# Patient Record
Sex: Female | Born: 1960 | Race: Black or African American | Hispanic: No | Marital: Single | Smoking: Never smoker
Health system: Southern US, Community
[De-identification: ages and names within clinical notes are randomized; demographics above are authoritative.]

## PROBLEM LIST (undated history)

## (undated) DIAGNOSIS — I10 Essential (primary) hypertension: Secondary | ICD-10-CM

## (undated) DIAGNOSIS — M1990 Unspecified osteoarthritis, unspecified site: Secondary | ICD-10-CM

## (undated) DIAGNOSIS — J189 Pneumonia, unspecified organism: Secondary | ICD-10-CM

## (undated) DIAGNOSIS — Z9889 Other specified postprocedural states: Secondary | ICD-10-CM

## (undated) DIAGNOSIS — K219 Gastro-esophageal reflux disease without esophagitis: Secondary | ICD-10-CM

## (undated) DIAGNOSIS — R011 Cardiac murmur, unspecified: Secondary | ICD-10-CM

## (undated) DIAGNOSIS — E119 Type 2 diabetes mellitus without complications: Secondary | ICD-10-CM

## (undated) DIAGNOSIS — T8859XA Other complications of anesthesia, initial encounter: Secondary | ICD-10-CM

## (undated) DIAGNOSIS — I739 Peripheral vascular disease, unspecified: Secondary | ICD-10-CM

## (undated) DIAGNOSIS — R112 Nausea with vomiting, unspecified: Secondary | ICD-10-CM

## (undated) DIAGNOSIS — I6529 Occlusion and stenosis of unspecified carotid artery: Secondary | ICD-10-CM

## (undated) DIAGNOSIS — N189 Chronic kidney disease, unspecified: Secondary | ICD-10-CM

## (undated) DIAGNOSIS — E785 Hyperlipidemia, unspecified: Secondary | ICD-10-CM

## (undated) DIAGNOSIS — G629 Polyneuropathy, unspecified: Secondary | ICD-10-CM

## (undated) HISTORY — DX: Gastro-esophageal reflux disease without esophagitis: K21.9

## (undated) HISTORY — DX: Essential (primary) hypertension: I10

## (undated) HISTORY — DX: Type 2 diabetes mellitus without complications: E11.9

## (undated) HISTORY — DX: Hyperlipidemia, unspecified: E78.5

## (undated) HISTORY — DX: Occlusion and stenosis of unspecified carotid artery: I65.29

---

## 1990-12-01 HISTORY — PX: TUBAL LIGATION: SHX77

## 2005-12-01 HISTORY — PX: DILATION AND CURETTAGE OF UTERUS: SHX78

## 2006-01-26 ENCOUNTER — Other Ambulatory Visit: Admission: RE | Admit: 2006-01-26 | Discharge: 2006-01-26 | Payer: Self-pay | Admitting: Obstetrics and Gynecology

## 2006-01-28 ENCOUNTER — Ambulatory Visit (HOSPITAL_COMMUNITY): Admission: RE | Admit: 2006-01-28 | Discharge: 2006-01-28 | Payer: Self-pay | Admitting: Obstetrics and Gynecology

## 2006-02-12 ENCOUNTER — Encounter: Admission: RE | Admit: 2006-02-12 | Discharge: 2006-02-12 | Payer: Self-pay | Admitting: Obstetrics and Gynecology

## 2006-06-01 ENCOUNTER — Encounter (INDEPENDENT_AMBULATORY_CARE_PROVIDER_SITE_OTHER): Payer: Self-pay | Admitting: Specialist

## 2006-06-01 ENCOUNTER — Inpatient Hospital Stay (HOSPITAL_COMMUNITY): Admission: RE | Admit: 2006-06-01 | Discharge: 2006-06-02 | Payer: Self-pay | Admitting: Obstetrics and Gynecology

## 2007-01-09 ENCOUNTER — Emergency Department (HOSPITAL_COMMUNITY): Admission: EM | Admit: 2007-01-09 | Discharge: 2007-01-09 | Payer: Self-pay | Admitting: Emergency Medicine

## 2007-07-19 ENCOUNTER — Emergency Department (HOSPITAL_COMMUNITY): Admission: EM | Admit: 2007-07-19 | Discharge: 2007-07-19 | Payer: Self-pay | Admitting: Emergency Medicine

## 2010-09-26 ENCOUNTER — Emergency Department (HOSPITAL_COMMUNITY): Admission: EM | Admit: 2010-09-26 | Discharge: 2010-09-26 | Payer: Self-pay | Admitting: Emergency Medicine

## 2011-02-12 LAB — BASIC METABOLIC PANEL
BUN: 10 mg/dL (ref 6–23)
CO2: 22 mEq/L (ref 19–32)
Calcium: 8.2 mg/dL — ABNORMAL LOW (ref 8.4–10.5)
Creatinine, Ser: 0.9 mg/dL (ref 0.4–1.2)
Glucose, Bld: 159 mg/dL — ABNORMAL HIGH (ref 70–99)

## 2011-02-12 LAB — CBC
MCH: 29.3 pg (ref 26.0–34.0)
MCHC: 33.6 g/dL (ref 30.0–36.0)
MCV: 87.2 fL (ref 78.0–100.0)
Platelets: 256 10*3/uL (ref 150–400)

## 2011-02-12 LAB — DIFFERENTIAL
Basophils Relative: 1 % (ref 0–1)
Eosinophils Absolute: 0.2 10*3/uL (ref 0.0–0.7)
Eosinophils Relative: 3 % (ref 0–5)
Neutrophils Relative %: 59 % (ref 43–77)

## 2011-02-12 LAB — D-DIMER, QUANTITATIVE: D-Dimer, Quant: 0.4 ug/mL-FEU (ref 0.00–0.48)

## 2011-04-18 NOTE — Op Note (Signed)
Meagan [name], Meagan [name]           ACCOUNT NO.:  [id number]   MEDICAL RECORD NO.:  [id number]          PATIENT TYPE:  AMB   LOCATION:  SDC                           FACILITY:  [facility]   PHYSICIAN:  Osborn Coho, M.D.   DATE OF BIRTH:  July 16, 1961   DATE OF PROCEDURE:  06/01/2006  DATE OF DISCHARGE:                                 OPERATIVE REPORT   PREOPERATIVE DIAGNOSES:  1.  Menorrhagia.  2.  Uterine fibroids.  3.  Mixed urinary incontinence with hypermobile urethra.   POSTOPERATIVE DIAGNOSES:  1.  Menorrhagia.  2.  Uterine fibroids.  3.  Mixed urinary incontinence with hypermobile urethra.   PROCEDURE:  Hysteroscopy D&C and endometrial ablation via Novasure and TVT  and cystoscopy.   ATTENDING:  Osborn Coho, M.D.   ASSISTANT:  Marquis Lunch. Adline Peals.   ANESTHESIA:  General.   SPECIMENS TO PATHOLOGY:  Endometrial curettings, frozen section  proliferative phase endometrium and portions sent for permanent.   FLUID:  2300 mL.   URINE OUTPUT:  100 mL.   LACTATED RINGER'S HYSTEROSCOPIC DEFICIT:  60 mL.   COMPLICATIONS:  None.   FINDINGS:  The uterus sounded to 10 cm, cervix was measured 4 cm, cavity  length 6 cm, cavity width 2.7 cm, ablation for approximately one minute and  38 seconds.   PROCEDURE:  The patient is taken to the operating room after risks,  benefits, alternatives reviewed with the patient.  The patient verbalized  understanding, consent signed and witnessed.  The patient was placed under  general per anesthesia and prepped and draped in normal sterile fashion.  A  bivalve speculum placed in the patient's vagina and the anterior lip of the  cervix grasped with single-tooth tenaculum.  The cervix was measured and  uterus sounded and cervix was dilated for passage of the diagnostic  hysteroscope.  Diagnostic hysteroscope introduced and no intracavitary  lesions noted with fluffy endometrium.  Curettage performed and endometrial  curettings sent to  pathology for frozen section and a portion for permanent.  While awaiting frozen section, instruments were then removed and weighted  speculum placed.  Vaginal wall retractor used as well.  Dilute Pitressin and  injected in the anterior vaginal mucosa at the level of the mid urethra.  Vaginal mucosa was incised and dissected away from the underlying tissue.  This was done to the level of the lower edge of symphysis pubis.  Attention  was then turned to the mons pubis where two incisions approximately 5 mm  were made two fingerbreadths from the midline bilaterally at the upper edge  of the symphysis pubis.  The bladder was drained and rigid urethral catheter  guide placed and an 18-French Foley catheter in the bladder.  The guide was  deflected to the right and transabdominal guide passed on the ipsilateral  side through the space of Retzius out through the vaginal wall.  The same  was done on the contralateral side.  Cystoscopy was performed and no  inadvertent bladder injury noted.  The mesh was then attached to the  transabdominal guides and after emptying the bladder and deflecting guide  to  the appropriate side the mesh was elevated through the space of Retzius.  The mesh was left flat with a large Kelly beneath the mid urethra and an  anterior vaginal mucosa repaired with 2-0 Vicryl in a running locked  fashion.  The mesh at the mons pubis was then cut flush with the skin and  the incision repaired with Dermabond.  The weighted speculum was removed and  bivalve speculum placed once again and tenaculum placed on the anterior lip.  The cervix was dilated for passage of the Novasure instrument.  The Novasure  was placed and ablation performed without difficulty.  All instruments were  removed and vagina was packed with 2-inch plain packing soaked with  estrogen.  Sponge, lap and needle count was correct.  The patient tolerated  procedure well and was transferred to the recovery room in  good condition.      Osborn Coho, M.D.  Electronically Signed     AR/MEDQ  D:  06/01/2006  T:  06/01/2006  Job:  [id number]

## 2011-04-18 NOTE — H&P (Signed)
NAME:  PRENTISS, HAMMETT           ACCOUNT NO.:  [id number]   MEDICAL RECORD NO.:  [id number]          PATIENT TYPE:  AMB   LOCATION:  SDC                           [facility]ILITY:  [facility]   PHYSICIAN:  Osborn Coho, M.D.   DATE OF BIRTH:  11-Jul-1961   DATE OF ADMISSION:  DATE OF DISCHARGE:                                HISTORY & PHYSICAL   HISTORY OF PRESENT ILLNESS:  Ms. Desa is a 50 year old divorced African-  American female para 2-0-0-2 who presents for hysteroscopy with D&C and  endometrial ablation, along with placement of tension-free vaginal tape  because of menorrhagia and incontinence.  The patient describes a 4-day  period accompanied by heavy clotting during which time she uses three long  pads per day.  The patient also relays that when she goes to the bathroom to  void the toilet (during these times) is filled with blood.  More recently  the patient has also had some intermenstrual bleeding which she describes as  spotting for approximately 1 day but will occur randomly between one period  and the next.  Additionally, the patient has noticed some urine leakage  whenever she coughs or sneezes, and even on occasion she will have  spontaneous leakage.  Cystometrics done on May 14, 2006, revealed that the  patient had mixed urinary incontinence.  A pelvic ultrasound in March 2007  revealed an anterior fibroid measuring 2.3 x 2.3 x 2.1 cm and a TSH also  done at that time was within normal limits.  After review of both medical  and surgical options for symptom management, the patient has decided to  proceed with endometrial ablation and the placement of tension-free vaginal  tape.   PAST MEDICAL HISTORY:  1.  Obstetric history:  Gravida 2, para 2-0-0-2.  The patient delivered      spontaneously through vagina on each occasion.  She experienced      gestational diabetes and anemia.  2.  GYN history:  Menarche 50 years old.  Last menstrual period May 21, 2006.  The  patient uses bilateral tubal ligation as her method of      contraception.  Denies any history of abnormal Pap smears.  She does      have a remote history of syphilis and chlamydia.  Her last normal Pap      smear was February 2007.  3.  Medical history:  Diabetes mellitus, gastroesophageal reflux disease,      and anemia.  4.  Surgical history:  In 1992, bilateral tubal ligation.  In 1994, right      knee surgery.  The patient denies any reaction to anesthesia or history      of blood transfusions.   FAMILY HISTORY:  Cardiovascular disease, cancer, hypertension, and diabetes  mellitus.   HABITS:  The patient occasionally consumes alcohol.  Does not use any  tobacco.   CURRENT MEDICATIONS:  1.  Nexium 40 mg daily.  2.  Crestor 20 mg daily.  3.  Byetta 10 mcg subcu in a.m. and prior to evening meal.  4.  Lisinopril/hydrochlorothiazide 10/12.5 one  tablet daily.  5.  Metformin 750 mg two tablets at bedtime.  6.  Actos 30 mg one tablet daily.  7.  Detrol LA 40 mg one tablet daily.   ALLERGIES:  The patient is allergic to PHENAZOPYRIDINE which causes  shortness of breath and symptoms of Bells palsy.   REVIEW OF SYSTEMS:  Except as mentioned in history of present illness are  negative to include no chest pain, shortness of breath, nausea, vomiting,  diarrhea, fever, or pelvic pain.   PHYSICAL EXAMINATION:  VITAL SIGNS:  Blood pressure 120/80, weight 231  pounds, height 5 feet 3 inches tall.  NECK:  Supple without masses.  There is no thyromegaly or cervical  adenopathy.  HEART:  Regular rate and rhythm.  There is no murmur.  LUNGS:  Clear to auscultation.  BACK:  No CVA tenderness.  ABDOMEN:  Bowel sounds are present.  It is soft without tenderness,  guarding, rebound, or organomegaly.  EXTREMITIES:  Without clubbing, cyanosis, or edema.  PELVIC:  EG/BUS is within normal limits.  The vagina is rugous.  The cervix  is nontender without lesions.  Uterus appears normal size, shape  and  consistency without tenderness though exam is limited by body habitus.  Adnexa without tenderness or masses.   IMPRESSION:  1.  Menorrhagia.  2.  Mixed urinary incontinence.  3.  Uterine fibroids.   DISPOSITION:  A discussion was held with the patient regarding the  indications for her procedures, along with their risks, which include but  are not limited to reaction to anesthesia, damage to adjacent organs,  infection, excessive bleeding, erosion of tension-free vaginal tape, and the  possibility that her incontinence may not improve.  The patient verbalizes  understanding of these risks and has consented to proceed with hysteroscopy  with D&C and endometrial ablation along with the placement of tension-free  vaginal tape at [facility] [place] on June 01, 2006, at 9:30 a.m.      [place] J. Adline Peals.      Osborn Coho, M.D.  Electronically Signed    EJP/MEDQ  D:  05/29/2006  T:  05/29/2006  Job:  [id number]

## 2011-09-12 LAB — I-STAT 8, (EC8 V) (CONVERTED LAB)
Acid-base deficit: 3 — ABNORMAL HIGH
BUN: 17
Bicarbonate: 22.4
Chloride: 102
Glucose, Bld: 358 — ABNORMAL HIGH
HCT: 45
Hemoglobin: 15.3 — ABNORMAL HIGH
Operator id: 294511
Potassium: 4.8
Sodium: 135
TCO2: 24
pCO2, Ven: 40.3 — ABNORMAL LOW
pH, Ven: 7.354 — ABNORMAL HIGH

## 2011-09-12 LAB — URINALYSIS, ROUTINE W REFLEX MICROSCOPIC
Bilirubin Urine: NEGATIVE
Glucose, UA: >1000 — AB
Ketones, ur: 15 — AB
Leukocytes, UA: NEGATIVE
Protein, ur: NEGATIVE

## 2011-09-12 LAB — LACTIC ACID, PLASMA: Lactic Acid, Venous: 5.7 — ABNORMAL HIGH

## 2012-08-09 ENCOUNTER — Encounter: Payer: Self-pay | Admitting: General Practice

## 2012-08-09 ENCOUNTER — Telehealth: Payer: Self-pay | Admitting: Gastroenterology

## 2012-08-09 NOTE — Telephone Encounter (Signed)
Received 10 pages from [facility]. Sent to Dr. Russella Dar. 08/09/12/sd

## 2012-09-07 ENCOUNTER — Telehealth: Payer: Self-pay | Admitting: *Deleted

## 2012-09-07 ENCOUNTER — Ambulatory Visit (AMBULATORY_SURGERY_CENTER): Payer: BC Managed Care – PPO | Admitting: *Deleted

## 2012-09-07 ENCOUNTER — Encounter: Payer: Self-pay | Admitting: Gastroenterology

## 2012-09-07 VITALS — Ht 63.0 in | Wt 230.8 lb

## 2012-09-07 DIAGNOSIS — Z1211 Encounter for screening for malignant neoplasm of colon: Secondary | ICD-10-CM

## 2012-09-07 MED ORDER — NA SULFATE-K SULFATE-MG SULF 17.5-3.13-1.6 GM/177ML PO SOLN: ORAL | Status: DC

## 2012-09-07 NOTE — Progress Notes (Signed)
Pt scheduled for direct colonoscopy Monday 10/21 with Dr. Stark.  Pt says she had colonoscopy 5 to 7 years ago at [facility] in [place], [place].  She does not know if she had polyps.  Release of information form signed and given to Amanda Lewellyn, CMA.  

## 2012-09-07 NOTE — Telephone Encounter (Signed)
Pt scheduled for direct colonoscopy Monday 10/21 with Dr. Russella Dar.  Pt says she had colonoscopy 5 to 7 years ago at [facility] in [place], [place].  She does not know if she had polyps.  Release of information form signed and given to Christie Nottingham, CMA.

## 2012-09-08 NOTE — Telephone Encounter (Signed)
Records received and put on your desk to review.

## 2012-09-08 NOTE — Telephone Encounter (Signed)
The records we have from [place], [place] show a colonoscopy in 07/2004. Mother had metastatic cancer, colon primary suspected but not proven, and her mother had colon polyps and UC. OK to proceed with direct colonoscopy now.

## 2012-09-08 NOTE — Telephone Encounter (Signed)
Pt notified to keep Colonoscopy appt per Dr. Ardell Isaacs recommendations.Pt agreed and verbalized understanding.

## 2012-09-20 ENCOUNTER — Encounter: Payer: Self-pay | Admitting: Gastroenterology

## 2012-09-20 ENCOUNTER — Ambulatory Visit (AMBULATORY_SURGERY_CENTER): Payer: BC Managed Care – PPO | Admitting: Gastroenterology

## 2012-09-20 VITALS — BP 123/65 | HR 72 | Temp 96.7°F | Resp 8 | Ht 63.0 in | Wt 230.0 lb

## 2012-09-20 DIAGNOSIS — D126 Benign neoplasm of colon, unspecified: Secondary | ICD-10-CM

## 2012-09-20 DIAGNOSIS — Z1211 Encounter for screening for malignant neoplasm of colon: Secondary | ICD-10-CM

## 2012-09-20 LAB — GLUCOSE, CAPILLARY
Glucose-Capillary: 222 mg/dL — ABNORMAL HIGH (ref 70–99)
Glucose-Capillary: 193 mg/dL — ABNORMAL HIGH (ref 70–99)

## 2012-09-20 MED ORDER — SODIUM CHLORIDE 0.9 % IV SOLN: 500.0000 mL | INTRAVENOUS | Status: DC

## 2012-09-20 NOTE — Op Note (Signed)
[facility] [address] [place], [address]   COLONOSCOPY PROCEDURE REPORT  PATIENT: Meagan [name], Meagan [name]  MR#: [id number] BIRTHDATE: 22-Jun-1961 , 51  yrs. old GENDER: Female ENDOSCOPIST: Meryl Dare, MD, [facility] REFERRED XB:JYNWG Allyne Gee, M.D. PROCEDURE DATE:  09/20/2012 PROCEDURE:   Colonoscopy with snare polypectomy ASA CLASS:   Class II INDICATIONS:average risk screening. MEDICATIONS: MAC sedation, administered by CRNA and propofol (Diprivan) 240mg  IV DESCRIPTION OF PROCEDURE:   After the risks benefits and alternatives of the procedure were thoroughly explained, informed consent was obtained.  A digital rectal exam revealed no abnormalities of the rectum.   The LB CF-H180AL [id number]  endoscope was introduced through the anus and advanced to the cecum, which was identified by both the appendix and ileocecal valve. No adverse events experienced.   The quality of the prep was good, using MoviPrep  The instrument was then slowly withdrawn as the colon was fully examined.   COLON FINDINGS: A sessile polyp measuring 10 mm in size was found in the ascending colon.  A polypectomy was performed using snare cautery.  The resection was complete and the polyp tissue was completely retrieved.   The colon was otherwise normal.  There was no diverticulosis, inflammation, polyps or cancers unless previously stated.  Retroflexed views revealed no abnormalities. The time to cecum=2 minutes 10 seconds.  Withdrawal time=10 minutes 43 seconds.  The scope was withdrawn and the procedure completed. COMPLICATIONS: There were no complications.  ENDOSCOPIC IMPRESSION: 1.   Sessile polyp in the ascending colon; polypectomy was performed using snare cautery 2.   The colon was otherwise normal  RECOMMENDATIONS: 1.  Hold aspirin, aspirin products, and anti-inflammatory medication for 2 weeks. 2.  Await pathology results 3.  Repeat colonoscopy in 5 years if polyp adenomatous;  otherwise 10 years   eSigned:  Meryl Dare, MD, [facility] 09/20/2012 8:58 AM

## 2012-09-20 NOTE — Progress Notes (Signed)
Patient did not experience any of the following events: a burn prior to discharge; a fall within the facility; wrong site/side/patient/procedure/implant event; or a hospital transfer or hospital admission upon discharge from the facility. (G8907) Patient did not have preoperative order for IV antibiotic SSI prophylaxis. (G8918)  

## 2012-09-20 NOTE — Progress Notes (Signed)
Pressure applied to abdomen to reach the cecum 

## 2012-09-20 NOTE — Patient Instructions (Signed)
Colon polyp x 1 removed today. See handout. Hold aspirin,aspirin products, and anti-inflammatory medications for 2 weeks(until Nov.4th). Resume current medications. Result letter in your mail in 2-3 weeks. Call us with any questions or concerns. Blood sugar was 193. Thank you!!   YOU HAD AN ENDOSCOPIC PROCEDURE TODAY AT THE  ENDOSCOPY CENTER: Refer to the procedure report that was given to you for any specific questions about what was found during the examination.  If the procedure report does not answer your questions, please call your gastroenterologist to clarify.  If you requested that your care partner not be given the details of your procedure findings, then the procedure report has been included in a sealed envelope for you to review at your convenience later.  YOU SHOULD EXPECT: Some feelings of bloating in the abdomen. Passage of more gas than usual.  Walking can help get rid of the air that was put into your GI tract during the procedure and reduce the bloating. If you had a lower endoscopy (such as a colonoscopy or flexible sigmoidoscopy) you may notice spotting of blood in your stool or on the toilet paper. If you underwent a bowel prep for your procedure, then you may not have a normal bowel movement for a few days.  DIET: Your first meal following the procedure should be a light meal and then it is ok to progress to your normal diet.  A half-sandwich or bowl of soup is an example of a good first meal.  Heavy or fried foods are harder to digest and may make you feel nauseous or bloated.  Likewise meals heavy in dairy and vegetables can cause extra gas to form and this can also increase the bloating.  Drink plenty of fluids but you should avoid alcoholic beverages for 24 hours.  ACTIVITY: Your care partner should take you home directly after the procedure.  You should plan to take it easy, moving slowly for the rest of the day.  You can resume normal activity the day after the procedure  however you should NOT DRIVE or use heavy machinery for 24 hours (because of the sedation medicines used during the test).    SYMPTOMS TO REPORT IMMEDIATELY: A gastroenterologist can be reached at any hour.  During normal business hours, 8:30 AM to 5:00 PM Monday through Friday, call [phone].  After hours and on weekends, please call the GI answering service at [phone] who will take a message and have the physician on call contact you.   Following lower endoscopy (colonoscopy or flexible sigmoidoscopy):  Excessive amounts of blood in the stool  Significant tenderness or worsening of abdominal pains  Swelling of the abdomen that is new, acute  Fever of 100F or higher  FOLLOW UP: If any biopsies were taken you will be contacted by phone or by letter within the next 1-3 weeks.  Call your gastroenterologist if you have not heard about the biopsies in 3 weeks.  Our staff will call the home number listed on your records the next business day following your procedure to check on you and address any questions or concerns that you may have at that time regarding the information given to you following your procedure. This is a courtesy call and so if there is no answer at the home number and we have not heard from you through the emergency physician on call, we will assume that you have returned to your regular daily activities without incident.  SIGNATURES/CONFIDENTIALITY: You and/or your care  partner have signed paperwork which will be entered into your electronic medical record.  These signatures attest to the fact that that the information above on your After Visit Summary has been reviewed and is understood.  Full responsibility of the confidentiality of this discharge information lies with you and/or your care-partner.  

## 2012-09-21 ENCOUNTER — Telehealth: Payer: Self-pay | Admitting: *Deleted

## 2012-09-21 NOTE — Telephone Encounter (Signed)
  Follow up Call-  Call back number 09/20/2012  Post procedure Call Back phone  # [phone]  Permission to leave phone message Yes     Patient questions:  Do you have a fever, pain , or abdominal swelling? no Pain Score  0 *  Have you tolerated food without any problems? yes  Have you been able to return to your normal activities? yes  Do you have any questions about your discharge instructions: Diet   no Medications  no Follow up visit  no  Do you have questions or concerns about your Care? no  Actions: * If pain score is 4 or above: No action needed, pain <4.

## 2012-09-24 ENCOUNTER — Encounter: Payer: Self-pay | Admitting: Gastroenterology

## 2013-06-22 ENCOUNTER — Encounter (HOSPITAL_COMMUNITY): Payer: Self-pay | Admitting: Emergency Medicine

## 2013-06-22 ENCOUNTER — Emergency Department (HOSPITAL_COMMUNITY)
Admission: EM | Admit: 2013-06-22 | Discharge: 2013-06-22 | Disposition: A | Discharge status: Home / Self Care | Payer: BC Managed Care – PPO | Attending: Emergency Medicine | Admitting: Emergency Medicine

## 2013-06-22 DIAGNOSIS — K219 Gastro-esophageal reflux disease without esophagitis: Secondary | ICD-10-CM | POA: Insufficient documentation

## 2013-06-22 DIAGNOSIS — IMO0002 Reserved for concepts with insufficient information to code with codable children: Secondary | ICD-10-CM | POA: Insufficient documentation

## 2013-06-22 DIAGNOSIS — M545 Low back pain, unspecified: Secondary | ICD-10-CM

## 2013-06-22 DIAGNOSIS — Z79899 Other long term (current) drug therapy: Secondary | ICD-10-CM | POA: Insufficient documentation

## 2013-06-22 DIAGNOSIS — Y92410 Unspecified street and highway as the place of occurrence of the external cause: Secondary | ICD-10-CM | POA: Insufficient documentation

## 2013-06-22 DIAGNOSIS — S79919A Unspecified injury of unspecified hip, initial encounter: Secondary | ICD-10-CM | POA: Insufficient documentation

## 2013-06-22 DIAGNOSIS — E119 Type 2 diabetes mellitus without complications: Secondary | ICD-10-CM | POA: Insufficient documentation

## 2013-06-22 DIAGNOSIS — Y9389 Activity, other specified: Secondary | ICD-10-CM | POA: Insufficient documentation

## 2013-06-22 DIAGNOSIS — I10 Essential (primary) hypertension: Secondary | ICD-10-CM | POA: Insufficient documentation

## 2013-06-22 DIAGNOSIS — Z794 Long term (current) use of insulin: Secondary | ICD-10-CM | POA: Insufficient documentation

## 2013-06-22 DIAGNOSIS — E785 Hyperlipidemia, unspecified: Secondary | ICD-10-CM | POA: Insufficient documentation

## 2013-06-22 DIAGNOSIS — S0990XA Unspecified injury of head, initial encounter: Secondary | ICD-10-CM | POA: Insufficient documentation

## 2013-06-22 MED ORDER — CYCLOBENZAPRINE HCL 10 MG PO TABS: 10.0000 mg | ORAL_TABLET | Freq: Two times a day (BID) | ORAL | Status: DC | PRN

## 2013-06-22 MED ORDER — IBUPROFEN 800 MG PO TABS: 800.0000 mg | ORAL_TABLET | Freq: Three times a day (TID) | ORAL | Status: DC

## 2013-06-22 MED ORDER — HYDROCODONE-ACETAMINOPHEN 5-325 MG PO TABS: 1.0000 | ORAL_TABLET | Freq: Four times a day (QID) | ORAL | Status: DC | PRN

## 2013-06-22 MED ORDER — OXYCODONE-ACETAMINOPHEN 5-325 MG PO TABS
1.0000 | ORAL_TABLET | Freq: Once | ORAL | Status: AC
  Administered 2013-06-22: 1 via ORAL
  Filled 2013-06-22: qty 1
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
## 2013-06-23 NOTE — ED Provider Notes (Signed)
Medical screening examination/treatment/procedure(s) were performed by non-physician practitioner and as supervising physician I was immediately available for consultation/collaboration.   Carleene Cooper III, MD 06/23/13 1239

## 2013-07-07 ENCOUNTER — Encounter (HOSPITAL_COMMUNITY): Payer: Self-pay | Admitting: Emergency Medicine

## 2013-07-07 ENCOUNTER — Emergency Department (HOSPITAL_COMMUNITY)
Admission: EM | Admit: 2013-07-07 | Discharge: 2013-07-08 | Disposition: A | Discharge status: Home / Self Care | Payer: BC Managed Care – PPO | Attending: Emergency Medicine | Admitting: Emergency Medicine

## 2013-07-07 DIAGNOSIS — Z794 Long term (current) use of insulin: Secondary | ICD-10-CM | POA: Insufficient documentation

## 2013-07-07 DIAGNOSIS — Z862 Personal history of diseases of the blood and blood-forming organs and certain disorders involving the immune mechanism: Secondary | ICD-10-CM | POA: Insufficient documentation

## 2013-07-07 DIAGNOSIS — Z791 Long term (current) use of non-steroidal anti-inflammatories (NSAID): Secondary | ICD-10-CM | POA: Insufficient documentation

## 2013-07-07 DIAGNOSIS — I10 Essential (primary) hypertension: Secondary | ICD-10-CM | POA: Insufficient documentation

## 2013-07-07 DIAGNOSIS — Z8639 Personal history of other endocrine, nutritional and metabolic disease: Secondary | ICD-10-CM | POA: Insufficient documentation

## 2013-07-07 DIAGNOSIS — Z9851 Tubal ligation status: Secondary | ICD-10-CM | POA: Insufficient documentation

## 2013-07-07 DIAGNOSIS — K219 Gastro-esophageal reflux disease without esophagitis: Secondary | ICD-10-CM | POA: Insufficient documentation

## 2013-07-07 DIAGNOSIS — R739 Hyperglycemia, unspecified: Secondary | ICD-10-CM

## 2013-07-07 DIAGNOSIS — Z7901 Long term (current) use of anticoagulants: Secondary | ICD-10-CM | POA: Insufficient documentation

## 2013-07-07 DIAGNOSIS — Z888 Allergy status to other drugs, medicaments and biological substances status: Secondary | ICD-10-CM | POA: Insufficient documentation

## 2013-07-07 DIAGNOSIS — E1169 Type 2 diabetes mellitus with other specified complication: Secondary | ICD-10-CM | POA: Insufficient documentation

## 2013-07-07 DIAGNOSIS — R5381 Other malaise: Secondary | ICD-10-CM | POA: Insufficient documentation

## 2013-07-07 LAB — BASIC METABOLIC PANEL
Calcium: 8.6 mg/dL (ref 8.4–10.5)
GFR calc non Af Amer: 61 mL/min — ABNORMAL LOW (ref >90)
Glucose, Bld: 470 mg/dL — ABNORMAL HIGH (ref 70–99)
Sodium: 134 mEq/L — ABNORMAL LOW (ref 135–145)

## 2013-07-07 LAB — CBC
Hemoglobin: 14.5 g/dL (ref 12.0–15.0)
MCH: 30.5 pg (ref 26.0–34.0)
MCHC: 36.2 g/dL — ABNORMAL HIGH (ref 30.0–36.0)
RDW: 13.7 % (ref 11.5–15.5)

## 2013-07-07 LAB — GLUCOSE, CAPILLARY

## 2013-07-07 MED ORDER — SODIUM CHLORIDE 0.9 % IV BOLUS (SEPSIS)
1000.0000 mL | Freq: Once | INTRAVENOUS | Status: AC
  Administered 2013-07-07: 1000 mL via INTRAVENOUS

## 2013-07-07 NOTE — ED Provider Notes (Signed)
CSN: [id number]     Arrival date & time 07/07/13  1752 History     First MD Initiated Contact with Patient 07/07/13 2236     Chief Complaint  Patient presents with  . Hyperglycemia   (Consider location/radiation/quality/duration/timing/severity/associated sxs/prior Treatment) HPI Comments: Patient presents emergency department with chief complaint of hyperglycemia. She states that her sugars have been very poorly controlled this week. She states that her primary care provider is out of town, but that the nursing staff told her to come to the emergency department. She states that she has felt a little weak, but denies any nausea, vomiting, diarrhea, or constipation. She denies any chest pain, abdominal pain. She states that she recently switched her medications from Humalog to Januvia.  Nothing makes her symptoms better or worse.  The history is provided by the patient. No language interpreter was used.    Past Medical History  Diagnosis Date  . Diabetes mellitus   . Hyperlipidemia   . Hypertension   . GERD (gastroesophageal reflux disease)    Past Surgical History  Procedure Laterality Date  . Tubal ligation  1992  . Dilation and curettage of uterus  2007   Family History  Problem Relation Age of Onset  . Colon cancer Neg Hx   . Stomach cancer Neg Hx    History  Substance Use Topics  . Smoking status: Never Smoker   . Smokeless tobacco: Never Used  . Alcohol Use: Yes     Comment: occasional   OB History   Grav Para Term Preterm Abortions TAB SAB Ect Mult Living                 Review of Systems  All other systems reviewed and are negative.    Allergies  Phenothiazines  Home Medications   Current Outpatient Rx  Name  Route  Sig  Dispense  Refill  . CRESTOR 10 MG tablet   Oral   Take 10 mg by mouth daily.          . fish oil-omega-3 fatty acids 1000 MG capsule   Oral   Take 2 g by mouth daily.         Marland Kitchen ibuprofen (ADVIL,MOTRIN) 200 MG tablet   Oral   Take 800 mg by mouth every 6 (six) hours as needed for pain.         Marland Kitchen insulin aspart (NOVOLOG) 100 UNIT/ML injection   Subcutaneous   Inject 0-10 Units into the skin 3 (three) times daily with meals. Sliding scale         . insulin aspart protamine- aspart (NOVOLOG MIX 70/30) (70-30) 100 UNIT/ML injection   Subcutaneous   Inject 15-40 Units into the skin 2 (two) times daily with a meal. 40 in am and 15 at night         . lisinopril-hydrochlorothiazide (PRINZIDE,ZESTORETIC) 10-12.5 MG per tablet   Oral   Take 1 tablet by mouth daily.         . Multiple Vitamins-Minerals (MULTIVITAMIN WITH MINERALS) tablet   Oral   Take 1 tablet by mouth daily.         Marland Kitchen PRILOSEC OTC 20 MG tablet   Oral   Take 20 mg by mouth daily.           BP 140/57  Pulse 112  Temp(Src) 98.2 F (36.8 C) (Oral)  Resp 16  SpO2 98% Physical Exam  Nursing note and vitals reviewed. Constitutional: She is oriented to person, place,  and time. She appears well-developed and well-nourished.  HENT:  Head: Normocephalic and atraumatic.  Eyes: Conjunctivae and EOM are normal. Pupils are equal, round, and reactive to light.  Neck: Normal range of motion. Neck supple.  Cardiovascular: Normal rate and regular rhythm.  Exam reveals no gallop and no friction rub.   No murmur heard. Pulmonary/Chest: Effort normal and breath sounds normal. No respiratory distress. She has no wheezes. She has no rales. She exhibits no tenderness.  Abdominal: Soft. Bowel sounds are normal. She exhibits no distension and no mass. There is no tenderness. There is no rebound and no guarding.  Musculoskeletal: Normal range of motion. She exhibits no edema and no tenderness.  Neurological: She is alert and oriented to person, place, and time.  Skin: Skin is warm and dry.  Psychiatric: She has a normal mood and affect. Her behavior is normal. Judgment and thought content normal.    ED Course   Procedures (including critical care  time)  Labs Reviewed  CBC - Abnormal; Notable for the following:    MCHC 36.2 (*)    All other components within normal limits  BASIC METABOLIC PANEL - Abnormal; Notable for the following:    Sodium 134 (*)    Glucose, Bld 470 (*)    GFR calc non Af Amer 61 (*)    GFR calc Af Amer 71 (*)    All other components within normal limits  GLUCOSE, CAPILLARY - Abnormal; Notable for the following:    Glucose-Capillary 440 (*)    All other components within normal limits  GLUCOSE, CAPILLARY - Abnormal; Notable for the following:    Glucose-Capillary 371 (*)    All other components within normal limits  BLOOD GAS, VENOUS   Results for orders placed during the hospital encounter of 07/07/13  CBC      Result Value Range   WBC 7.0  4.0 - 10.5 K/uL   RBC 4.75  3.87 - 5.11 MIL/uL   Hemoglobin 14.5  12.0 - 15.0 g/dL   HCT 19.1  47.8 - 29.5 %   MCV 84.4  78.0 - 100.0 fL   MCH 30.5  26.0 - 34.0 pg   MCHC 36.2 (*) 30.0 - 36.0 g/dL   RDW 62.1  30.8 - 65.7 %   Platelets 235  150 - 400 K/uL  BASIC METABOLIC PANEL      Result Value Range   Sodium 134 (*) 135 - 145 mEq/L   Potassium 3.5  3.5 - 5.1 mEq/L   Chloride 99  96 - 112 mEq/L   CO2 24  19 - 32 mEq/L   Glucose, Bld 470 (*) 70 - 99 mg/dL   BUN 17  6 - 23 mg/dL   Creatinine, Ser 8.46  0.50 - 1.10 mg/dL   Calcium 8.6  8.4 - 96.2 mg/dL   GFR calc non Af Amer 61 (*) >90 mL/min   GFR calc Af Amer 71 (*) >90 mL/min  GLUCOSE, CAPILLARY      Result Value Range   Glucose-Capillary 440 (*) 70 - 99 mg/dL   Comment 1 Documented in Chart     Comment 2 Notify RN    GLUCOSE, CAPILLARY      Result Value Range   Glucose-Capillary 371 (*) 70 - 99 mg/dL   Comment 1 Documented in Chart     Comment 2 Notify RN    URINALYSIS, ROUTINE W REFLEX MICROSCOPIC      Result Value Range   Color, Urine YELLOW  YELLOW   APPearance CLEAR  CLEAR   Specific Gravity, Urine 1.023  1.005 - 1.030   pH 6.5  5.0 - 8.0   Glucose, UA NEGATIVE  NEGATIVE mg/dL   Hgb  urine dipstick NEGATIVE  NEGATIVE   Bilirubin Urine NEGATIVE  NEGATIVE   Ketones, ur NEGATIVE  NEGATIVE mg/dL   Protein, ur NEGATIVE  NEGATIVE mg/dL   Urobilinogen, UA 0.2  0.0 - 1.0 mg/dL   Nitrite NEGATIVE  NEGATIVE   Leukocytes, UA NEGATIVE  NEGATIVE  GLUCOSE, CAPILLARY      Result Value Range   Glucose-Capillary 326 (*) 70 - 99 mg/dL  POCT I-STAT 3, BLOOD GAS (G3P V)      Result Value Range   pH, Ven 7.360 (*) 7.250 - 7.300   pCO2, Ven 41.1 (*) 45.0 - 50.0 mmHg   pO2, Ven 29.0 (*) 30.0 - 45.0 mmHg   Bicarbonate 23.2  20.0 - 24.0 mEq/L   TCO2 24  0 - 100 mmol/L   O2 Saturation 52.0     Acid-base deficit 2.0  0.0 - 2.0 mmol/L   Patient temperature 98.2 F     Sample type VENOUS     Comment NOTIFIED PHYSICIAN    POCT I-STAT 3, BLOOD GAS (G3P V)      Result Value Range   pH, Ven 7.356 (*) 7.250 - 7.300   pCO2, Ven 41.3 (*) 45.0 - 50.0 mmHg   pO2, Ven 29.0 (*) 30.0 - 45.0 mmHg   Bicarbonate 23.2  20.0 - 24.0 mEq/L   TCO2 24  0 - 100 mmol/L   O2 Saturation 53.0     Acid-base deficit 2.0  0.0 - 2.0 mmol/L   Patient temperature 98.2 F     Sample type VENOUS     Comment NOTIFIED PHYSICIAN        1. Hyperglycemia     MDM  Patient with hyperglycemia. No evidence of DKA. No chest pain, no signs of infection. Will give fluids, and will reevaluate.  Discussed the patient with Dr. Dierdre Highman, who tells me that the patient can go home, and follow-up with PCP.  No changes in insulin.  Given dietary guidelines.  Patient understands and agrees with the plan.  12:59 AM Patient signed out the [place], PA-C, who will continue care.  Plan is f/u on CBG and UA.    Discharge to home once CBG is less than 250.  Roxy Horseman, PA-C 07/08/13 0100  Roxy Horseman, PA-C 07/08/13 0100

## 2013-07-07 NOTE — ED Notes (Signed)
CBG is 440. Notified Nurse [place].

## 2013-07-07 NOTE — ED Notes (Signed)
Pt here from her  Blood sugar running high 352 at home , pt only complaint is weakness, pt is being treated By Dr Allyne Gee and has recently had her insulin dose  changed

## 2013-07-08 LAB — URINALYSIS, ROUTINE W REFLEX MICROSCOPIC
Ketones, ur: 15 mg/dL — AB
Leukocytes, UA: NEGATIVE
Leukocytes, UA: NEGATIVE
Nitrite: NEGATIVE
Nitrite: NEGATIVE
Specific Gravity, Urine: 1.023 (ref 1.005–1.030)
Specific Gravity, Urine: 1.036 — ABNORMAL HIGH (ref 1.005–1.030)
pH: 6 (ref 5.0–8.0)
pH: 6.5 (ref 5.0–8.0)

## 2013-07-08 LAB — GLUCOSE, CAPILLARY
Glucose-Capillary: 267 mg/dL — ABNORMAL HIGH (ref 70–99)
Glucose-Capillary: 326 mg/dL — ABNORMAL HIGH (ref 70–99)

## 2013-07-08 LAB — URINE MICROSCOPIC-ADD ON

## 2013-07-08 LAB — POCT I-STAT 3, VENOUS BLOOD GAS (G3P V)
pCO2, Ven: 41.1 mmHg — ABNORMAL LOW (ref 45.0–50.0)
pCO2, Ven: 41.3 mmHg — ABNORMAL LOW (ref 45.0–50.0)
pO2, Ven: 29 mmHg — CL (ref 30.0–45.0)
pO2, Ven: 29 mmHg — CL (ref 30.0–45.0)

## 2013-07-08 MED ORDER — INSULIN ASPART 100 UNIT/ML ~~LOC~~ SOLN
10.0000 [IU] | Freq: Once | SUBCUTANEOUS | Status: AC
  Administered 2013-07-08: 10 [IU] via SUBCUTANEOUS
  Filled 2013-07-08: qty 1

## 2013-07-08 MED ORDER — SODIUM CHLORIDE 0.9 % IV BOLUS (SEPSIS)
1000.0000 mL | Freq: Once | INTRAVENOUS | Status: AC
  Administered 2013-07-08: 1000 mL via INTRAVENOUS

## 2013-07-08 NOTE — ED Notes (Signed)
[facility] ran i-stat venous blood gas it returned critical results, Spoke with nurse james camp as well as Roxy Horseman, PA  PA gave verbal orders to re run Venous blood gas which returned same results PA and Nurse were notified.

## 2013-07-08 NOTE — ED Notes (Signed)
RN spoke with [place] in lab. Pt's urinalysis results were incorrect in chart. The sample got mislabeled when lab relabeled the specimen. Lab to place pt's correct results in pt's chart.

## 2013-07-08 NOTE — ED Notes (Signed)
CBG 267 

## 2013-07-09 LAB — URINE CULTURE

## 2013-07-09 NOTE — ED Provider Notes (Signed)
Medical screening examination/treatment/procedure(s) were performed by non-physician practitioner and as supervising physician I was immediately available for consultation/collaboration.  Sunnie Nielsen, MD 07/09/13 0001

## 2015-12-10 ENCOUNTER — Encounter (HOSPITAL_COMMUNITY): Payer: Self-pay | Admitting: Emergency Medicine

## 2015-12-10 ENCOUNTER — Emergency Department (HOSPITAL_COMMUNITY): Payer: BC Managed Care – PPO

## 2015-12-10 ENCOUNTER — Emergency Department (HOSPITAL_COMMUNITY)
Admission: EM | Admit: 2015-12-10 | Discharge: 2015-12-10 | Disposition: A | Discharge status: Home / Self Care | Payer: BC Managed Care – PPO | Attending: Emergency Medicine | Admitting: Emergency Medicine

## 2015-12-10 DIAGNOSIS — Z794 Long term (current) use of insulin: Secondary | ICD-10-CM | POA: Insufficient documentation

## 2015-12-10 DIAGNOSIS — R05 Cough: Secondary | ICD-10-CM | POA: Diagnosis present

## 2015-12-10 DIAGNOSIS — K219 Gastro-esophageal reflux disease without esophagitis: Secondary | ICD-10-CM | POA: Insufficient documentation

## 2015-12-10 DIAGNOSIS — Z79899 Other long term (current) drug therapy: Secondary | ICD-10-CM | POA: Diagnosis not present

## 2015-12-10 DIAGNOSIS — R042 Hemoptysis: Secondary | ICD-10-CM | POA: Insufficient documentation

## 2015-12-10 DIAGNOSIS — E119 Type 2 diabetes mellitus without complications: Secondary | ICD-10-CM | POA: Insufficient documentation

## 2015-12-10 DIAGNOSIS — J069 Acute upper respiratory infection, unspecified: Secondary | ICD-10-CM | POA: Diagnosis not present

## 2015-12-10 DIAGNOSIS — I10 Essential (primary) hypertension: Secondary | ICD-10-CM | POA: Insufficient documentation

## 2015-12-10 LAB — BASIC METABOLIC PANEL
Anion gap: 15 (ref 5–15)
BUN: 16 mg/dL (ref 6–20)
CALCIUM: 9.3 mg/dL (ref 8.9–10.3)
CO2: 18 mmol/L — ABNORMAL LOW (ref 22–32)
CREATININE: 1.06 mg/dL — AB (ref 0.44–1.00)
Chloride: 103 mmol/L (ref 101–111)
GFR calc Af Amer: >60 mL/min (ref >60)
GFR, EST NON AFRICAN AMERICAN: 58 mL/min — AB (ref >60)
Glucose, Bld: 210 mg/dL — ABNORMAL HIGH (ref 65–99)
POTASSIUM: 3.9 mmol/L (ref 3.5–5.1)
SODIUM: 136 mmol/L (ref 135–145)

## 2015-12-10 LAB — CBC
HCT: 42.2 % (ref 36.0–46.0)
Hemoglobin: 14 g/dL (ref 12.0–15.0)
MCH: 29.4 pg (ref 26.0–34.0)
MCHC: 33.2 g/dL (ref 30.0–36.0)
MCV: 88.5 fL (ref 78.0–100.0)
PLATELETS: 314 10*3/uL (ref 150–400)
RBC: 4.77 MIL/uL (ref 3.87–5.11)
RDW: 14.9 % (ref 11.5–15.5)
WBC: 8.9 10*3/uL (ref 4.0–10.5)

## 2015-12-10 LAB — I-STAT TROPONIN, ED: TROPONIN I, POC: 0 ng/mL (ref 0.00–0.08)

## 2015-12-10 MED ORDER — BENZONATATE 100 MG PO CAPS: 100.0000 mg | ORAL_CAPSULE | Freq: Three times a day (TID) | ORAL | Status: DC

## 2015-12-10 NOTE — ED Provider Notes (Signed)
CSN: [id number]     Arrival date & time 12/10/15  1954 History   First MD Initiated Contact with Patient 12/10/15 2145     Chief Complaint  Patient presents with  . Cough  . Hemoptysis    HPI Sx started after christmas.  She has been coughing very frequently.  She has been bringing up some mucus but then this morning she noticed a little bit of blood in the mucus.  No fevers or shortness of breath.  No leg swelling.  No dyspnea.  No hx pe or dvt.   Decided to get it checked out today bc she has had pna in the past. Past Medical History  Diagnosis Date  . Diabetes mellitus ([place])   . Hyperlipidemia   . Hypertension   . GERD (gastroesophageal reflux disease)    Past Surgical History  Procedure Laterality Date  . Tubal ligation  1992  . Dilation and curettage of uterus  2007   Family History  Problem Relation Age of Onset  . Colon cancer Neg Hx   . Stomach cancer Neg Hx    Social History  Substance Use Topics  . Smoking status: Never Smoker   . Smokeless tobacco: Never Used  . Alcohol Use: Yes     Comment: occasional   OB History    No data available     Review of Systems  All other systems reviewed and are negative.     Allergies  Phenothiazines  Home Medications   Prior to Admission medications   Medication Sig Start Date End Date Taking? Authorizing Provider  canagliflozin (INVOKANA) 300 MG TABS tablet Take 300 mg by mouth daily before breakfast.   Yes Historical Provider, MD  CRESTOR 10 MG tablet Take 10 mg by mouth daily.  06/14/12  Yes Historical Provider, MD  fish oil-omega-3 fatty acids 1000 MG capsule Take 2 g by mouth daily.   Yes Historical Provider, MD  ibuprofen (ADVIL,MOTRIN) 200 MG tablet Take 800 mg by mouth every 6 (six) hours as needed for pain.   Yes Historical Provider, MD  insulin aspart (NOVOLOG) 100 UNIT/ML injection Inject 12-14 Units into the skin 2 (two) times daily. 12 units in the morning and 14 units in the evening.   Yes Historical  Provider, MD  Insulin Degludec (TRESIBA FLEXTOUCH) 200 UNIT/ML SOPN Inject 54 Units into the skin every evening.   Yes Historical Provider, MD  lisinopril-hydrochlorothiazide (PRINZIDE,ZESTORETIC) 10-12.5 MG per tablet Take 1 tablet by mouth daily.   Yes Historical Provider, MD  Multiple Vitamins-Minerals (MULTIVITAMIN WITH MINERALS) tablet Take 1 tablet by mouth daily.   Yes Historical Provider, MD  PRILOSEC OTC 20 MG tablet Take 20 mg by mouth daily.  08/23/12  Yes Historical Provider, MD  sertraline (ZOLOFT) 50 MG tablet Take 50 mg by mouth daily.   Yes Historical Provider, MD  sitaGLIPtin-metformin (JANUMET) 50-1000 MG tablet Take 1 tablet by mouth 2 (two) times daily with a meal.   Yes Historical Provider, MD  benzonatate (TESSALON) 100 MG capsule Take 1 capsule (100 mg total) by mouth every 8 (eight) hours. 12/10/15   Dorie Rank, MD   BP 132/89 mmHg  Pulse 101  Temp(Src) 98 F (36.7 C) (Oral)  Resp 18  Ht 5\' 3"  (1.6 m)  Wt 94.711 kg  BMI 37.00 kg/m2  SpO2 100% Physical Exam  Constitutional: She appears well-developed and well-nourished. No distress.  HENT:  Head: Normocephalic and atraumatic.  Right Ear: External ear normal.  Left Ear: External  ear normal.  Eyes: Conjunctivae are normal. Right eye exhibits no discharge. Left eye exhibits no discharge. No scleral icterus.  Neck: Neck supple. No tracheal deviation present.  Cardiovascular: Normal rate, regular rhythm and intact distal pulses.   Pulmonary/Chest: Effort normal and breath sounds normal. No stridor. No respiratory distress. She has no wheezes. She has no rales.  Abdominal: Soft. Bowel sounds are normal. She exhibits no distension. There is no tenderness. There is no rebound and no guarding.  Musculoskeletal: She exhibits no edema or tenderness.  Neurological: She is alert. She has normal strength. No cranial nerve deficit (no facial droop, extraocular movements intact, no slurred speech) or sensory deficit. She exhibits  normal muscle tone. She displays no seizure activity. Coordination normal.  Skin: Skin is warm and dry. No rash noted.  Psychiatric: She has a normal mood and affect.  Nursing note and vitals reviewed.   ED Course  Procedures (including critical care time) Labs Review Labs Reviewed  BASIC METABOLIC PANEL - Abnormal; Notable for the following:    CO2 18 (*)    Glucose, Bld 210 (*)    Creatinine, Ser 1.06 (*)    GFR calc non Af Amer 58 (*)    All other components within normal limits  CBC  I-STAT TROPOININ, ED    Imaging Review Dg Chest 2 View  12/10/2015  CLINICAL DATA:  55 year old female with history of cough and hemoptysis. Subjective fevers. EXAM: CHEST  2 VIEW COMPARISON:  Chest x-ray 09/26/2010. FINDINGS: Lung volumes are normal. No consolidative airspace disease. No pleural effusions. No pneumothorax. No pulmonary nodule or mass noted. Pulmonary vasculature and the cardiomediastinal silhouette are within normal limits. IMPRESSION: No radiographic evidence of acute cardiopulmonary disease. Electronically Signed   By: Vinnie Langton M.D.   On: 12/10/2015 20:40   I have personally reviewed and evaluated these images and lab results as part of my medical decision-making.   MDM   Final diagnoses:  URI (upper respiratory infection)    Symptoms are consistent with a simple upper respiratory infection. There is no evidence to suggest pneumonia on my exam. Labs are normal except for her bicarb.  I doubt this is significant.  Normal renal function.  I discussed supportive treatment. I encouraged followup with the primary care doctor next week if symptoms have not resolved. Warning signs and reasons to return to the emergency room were discussed.  Routine recheck of her BMET with PCP      Dorie Rank, MD 12/10/15 2216

## 2015-12-10 NOTE — ED Notes (Signed)
Patient here with cough and accompanied by hemoptysis and subjective fevers. States history of chronic bronchitis and frequent pneumonia. Denies international travel.

## 2015-12-10 NOTE — Discharge Instructions (Signed)
Upper Respiratory Infection, Adult Most upper respiratory infections (URIs) are a viral infection of the air passages leading to the lungs. A URI affects the nose, throat, and upper air passages. The most common type of URI is nasopharyngitis and is typically referred to as "the common cold." URIs run their course and usually go away on their own. Most of the time, a URI does not require medical attention, but sometimes a bacterial infection in the upper airways can follow a viral infection. This is called a secondary infection. Sinus and middle ear infections are common types of secondary upper respiratory infections. Bacterial pneumonia can also complicate a URI. A URI can worsen asthma and chronic obstructive pulmonary disease (COPD). Sometimes, these complications can require emergency medical care and may be life threatening.  CAUSES Almost all URIs are caused by viruses. A virus is a type of germ and can spread from one person to another.  RISKS FACTORS You may be at risk for a URI if:   You smoke.   You have chronic heart or lung disease.  You have a weakened defense (immune) system.   You are very young or very old.   You have nasal allergies or asthma.  You work in crowded or poorly ventilated areas.  You work in health care facilities or schools. SIGNS AND SYMPTOMS  Symptoms typically develop 2-3 days after you come in contact with a cold virus. Most viral URIs last 7-10 days. However, viral URIs from the influenza virus (flu virus) can last 14-18 days and are typically more severe. Symptoms may include:   Runny or stuffy (congested) nose.   Sneezing.   Cough.   Sore throat.   Headache.   Fatigue.   Fever.   Loss of appetite.   Pain in your forehead, behind your eyes, and over your cheekbones (sinus pain).  Muscle aches.  DIAGNOSIS  Your health care provider may diagnose a URI by:  Physical exam.  Tests to check that your symptoms are not due to  another condition such as:  Strep throat.  Sinusitis.  Pneumonia.  Asthma. TREATMENT  A URI goes away on its own with time. It cannot be cured with medicines, but medicines may be prescribed or recommended to relieve symptoms. Medicines may help:  Reduce your fever.  Reduce your cough.  Relieve nasal congestion. HOME CARE INSTRUCTIONS   Take medicines only as directed by your health care provider.   Gargle warm saltwater or take cough drops to comfort your throat as directed by your health care provider.  Use a warm mist humidifier or inhale steam from a shower to increase air moisture. This may make it easier to breathe.  Drink enough fluid to keep your urine clear or pale yellow.   Eat soups and other clear broths and maintain good nutrition.   Rest as needed.   Return to work when your temperature has returned to normal or as your health care provider advises. You may need to stay home longer to avoid infecting others. You can also use a face mask and careful hand washing to prevent spread of the virus.  Increase the usage of your inhaler if you have asthma.   Do not use any tobacco products, including cigarettes, chewing tobacco, or electronic cigarettes. If you need help quitting, ask your health care provider. PREVENTION  The best way to protect yourself from getting a cold is to practice good hygiene.   Avoid oral or hand contact with people with cold   symptoms.   Wash your hands often if contact occurs.  There is no clear evidence that vitamin C, vitamin E, echinacea, or exercise reduces the chance of developing a cold. However, it is always recommended to get plenty of rest, exercise, and practice good nutrition.  SEEK MEDICAL CARE IF:   You are getting worse rather than better.   Your symptoms are not controlled by medicine.   You have chills.  You have worsening shortness of breath.  You have brown or red mucus.  You have yellow or brown nasal  discharge.  You have pain in your face, especially when you bend forward.  You have a fever.  You have swollen neck glands.  You have pain while swallowing.  You have white areas in the back of your throat. SEEK IMMEDIATE MEDICAL CARE IF:   You have severe or persistent:  Headache.  Ear pain.  Sinus pain.  Chest pain.  You have chronic lung disease and any of the following:  Wheezing.  Prolonged cough.  Coughing up blood.  A change in your usual mucus.  You have a stiff neck.  You have changes in your:  Vision.  Hearing.  Thinking.  Mood. MAKE SURE YOU:   Understand these instructions.  Will watch your condition.  Will get help right away if you are not doing well or get worse.   This information is not intended to replace advice given to you by your health care provider. Make sure you discuss any questions you have with your health care provider.   Document Released: 05/13/2001 Document Revised: 04/03/2015 Document Reviewed: 02/22/2014 Elsevier Interactive Patient Education 2016 [employer]  

## 2016-02-21 ENCOUNTER — Emergency Department (HOSPITAL_COMMUNITY): Payer: BC Managed Care – PPO

## 2016-02-21 ENCOUNTER — Encounter (HOSPITAL_COMMUNITY): Payer: Self-pay | Admitting: Emergency Medicine

## 2016-02-21 ENCOUNTER — Emergency Department (HOSPITAL_COMMUNITY)
Admission: EM | Admit: 2016-02-21 | Discharge: 2016-02-21 | Disposition: A | Discharge status: Home / Self Care | Payer: BC Managed Care – PPO | Attending: Emergency Medicine | Admitting: Emergency Medicine

## 2016-02-21 DIAGNOSIS — K219 Gastro-esophageal reflux disease without esophagitis: Secondary | ICD-10-CM | POA: Diagnosis not present

## 2016-02-21 DIAGNOSIS — Z79899 Other long term (current) drug therapy: Secondary | ICD-10-CM | POA: Diagnosis not present

## 2016-02-21 DIAGNOSIS — R05 Cough: Secondary | ICD-10-CM | POA: Diagnosis present

## 2016-02-21 DIAGNOSIS — I10 Essential (primary) hypertension: Secondary | ICD-10-CM | POA: Insufficient documentation

## 2016-02-21 DIAGNOSIS — Z794 Long term (current) use of insulin: Secondary | ICD-10-CM | POA: Diagnosis not present

## 2016-02-21 DIAGNOSIS — B349 Viral infection, unspecified: Secondary | ICD-10-CM | POA: Diagnosis not present

## 2016-02-21 DIAGNOSIS — E785 Hyperlipidemia, unspecified: Secondary | ICD-10-CM | POA: Insufficient documentation

## 2016-02-21 DIAGNOSIS — R112 Nausea with vomiting, unspecified: Secondary | ICD-10-CM | POA: Diagnosis not present

## 2016-02-21 DIAGNOSIS — R197 Diarrhea, unspecified: Secondary | ICD-10-CM | POA: Insufficient documentation

## 2016-02-21 DIAGNOSIS — E119 Type 2 diabetes mellitus without complications: Secondary | ICD-10-CM | POA: Insufficient documentation

## 2016-02-21 DIAGNOSIS — J40 Bronchitis, not specified as acute or chronic: Secondary | ICD-10-CM | POA: Diagnosis not present

## 2016-02-21 DIAGNOSIS — R42 Dizziness and giddiness: Secondary | ICD-10-CM | POA: Insufficient documentation

## 2016-02-21 MED ORDER — ONDANSETRON 4 MG PO TBDP: 4.0000 mg | ORAL_TABLET | Freq: Three times a day (TID) | ORAL | Status: DC | PRN

## 2016-02-21 MED ORDER — IBUPROFEN 800 MG PO TABS: 800.0000 mg | ORAL_TABLET | Freq: Three times a day (TID) | ORAL | Status: DC

## 2016-02-21 MED ORDER — PREDNISONE 20 MG PO TABS: 40.0000 mg | ORAL_TABLET | Freq: Every day | ORAL | Status: DC

## 2016-02-21 MED ORDER — BENZONATATE 100 MG PO CAPS: 100.0000 mg | ORAL_CAPSULE | Freq: Three times a day (TID) | ORAL | Status: DC

## 2016-02-21 NOTE — ED Notes (Signed)
Screened by nurse first, states "I thought I had the flu, then i thought it was my asthma, and now i think i have bronchitis"

## 2016-02-21 NOTE — ED Provider Notes (Signed)
CSN: [id number]     Arrival date & time 02/21/16  1609 History  By signing my name below, I, Hansel Feinstein, attest that this documentation has been prepared under the direction and in the presence of Sheletha Bow Y Shaarav Ripple, [place]. Electronically Signed: Hansel Feinstein, ED Scribe. 02/21/2016. 5:21 PM.    Chief Complaint  Patient presents with  . Cough   The history is provided by the patient. No language interpreter was used.   HPI Comments: Meagan [name] is a 55 y.o. female with h/o DM, HLD, HTN, GERD who presents to the Emergency Department complaining of moderate, intermittent productive cough onset 4 days ago with associated lightheadedness with coughing, mild wheezing, nausea, emesis (last emesis yesterday), diarrhea, chills, rhinorrhea. She also reports some generalized myalgias and rib tenderness secondary to coughing 2 days ago, but this has now resolved. Pt notes she has been taking Mucinex with some relief of symptoms. She denies known fever, dysuria, CP, current myalgias, SOB.    Past Medical History  Diagnosis Date  . Diabetes mellitus ([place])   . Hyperlipidemia   . Hypertension   . GERD (gastroesophageal reflux disease)    Past Surgical History  Procedure Laterality Date  . Tubal ligation  1992  . Dilation and curettage of uterus  2007   Family History  Problem Relation Age of Onset  . Colon cancer Neg Hx   . Stomach cancer Neg Hx    Social History  Substance Use Topics  . Smoking status: Never Smoker   . Smokeless tobacco: Never Used  . Alcohol Use: Yes     Comment: occasional   OB History    No data available     Review of Systems  Constitutional: Positive for chills. Negative for fever.  HENT: Positive for rhinorrhea.   Respiratory: Positive for cough and wheezing. Negative for shortness of breath.   Cardiovascular: Negative for chest pain.  Gastrointestinal: Positive for nausea, vomiting and diarrhea.  Genitourinary: Negative for dysuria.  Musculoskeletal: Negative  for myalgias.  Neurological: Positive for light-headedness.  All other systems reviewed and are negative.  Allergies  Phenothiazines  Home Medications   Prior to Admission medications   Medication Sig Start Date End Date Taking? Authorizing Provider  benzonatate (TESSALON) 100 MG capsule Take 1 capsule (100 mg total) by mouth every 8 (eight) hours. 12/10/15   Dorie Rank, MD  canagliflozin (INVOKANA) 300 MG TABS tablet Take 300 mg by mouth daily before breakfast.    Historical Provider, MD  CRESTOR 10 MG tablet Take 10 mg by mouth daily.  06/14/12   Historical Provider, MD  fish oil-omega-3 fatty acids 1000 MG capsule Take 2 g by mouth daily.    Historical Provider, MD  ibuprofen (ADVIL,MOTRIN) 200 MG tablet Take 800 mg by mouth every 6 (six) hours as needed for pain.    Historical Provider, MD  insulin aspart (NOVOLOG) 100 UNIT/ML injection Inject 12-14 Units into the skin 2 (two) times daily. 12 units in the morning and 14 units in the evening.    Historical Provider, MD  Insulin Degludec (TRESIBA FLEXTOUCH) 200 UNIT/ML SOPN Inject 54 Units into the skin every evening.    Historical Provider, MD  lisinopril-hydrochlorothiazide (PRINZIDE,ZESTORETIC) 10-12.5 MG per tablet Take 1 tablet by mouth daily.    Historical Provider, MD  Multiple Vitamins-Minerals (MULTIVITAMIN WITH MINERALS) tablet Take 1 tablet by mouth daily.    Historical Provider, MD  PRILOSEC OTC 20 MG tablet Take 20 mg by mouth daily.  08/23/12  Historical Provider, MD  sertraline (ZOLOFT) 50 MG tablet Take 50 mg by mouth daily.    Historical Provider, MD  sitaGLIPtin-metformin (JANUMET) 50-1000 MG tablet Take 1 tablet by mouth 2 (two) times daily with a meal.    Historical Provider, MD   BP 139/73 mmHg  Pulse 100  Temp(Src) 98.5 F (36.9 C) (Oral)  Resp 18  Ht 5\' 3"  (1.6 m)  Wt 210 lb (95.255 kg)  BMI 37.21 kg/m2  SpO2 97% Physical Exam  Constitutional: She is oriented to person, place, and time. She appears  well-developed and well-nourished.  HENT:  Head: Normocephalic and atraumatic.  Eyes: Conjunctivae and EOM are normal. Pupils are equal, round, and reactive to light.  Neck: Normal range of motion. Neck supple.  Cardiovascular: Normal rate, regular rhythm and normal heart sounds.   Pulmonary/Chest: Effort normal and breath sounds normal. No respiratory distress. She has no wheezes. She has no rales. She exhibits no tenderness.  Abdominal: Soft. Bowel sounds are normal. She exhibits no distension. There is no tenderness.  Musculoskeletal: Normal range of motion.  Neurological: She is alert and oriented to person, place, and time.  Skin: Skin is warm and dry.  Psychiatric: She has a normal mood and affect. Her behavior is normal.  Nursing note and vitals reviewed.   ED Course  Procedures (including critical care time) DIAGNOSTIC STUDIES: Oxygen Saturation is 97% on RA, normal by my interpretation.    COORDINATION OF CARE: 5:20 PM Discussed treatment plan with pt at bedside which includes CXR and pt agreed to plan.   Imaging Review Dg Chest 2 View  02/21/2016  CLINICAL DATA:  Four-day history of productive cough. EXAM: CHEST  2 VIEW COMPARISON:  12/10/2015. FINDINGS: The cardiac silhouette, mediastinal and hilar contours are within normal limits and stable. There are mild bronchitic changes with peribronchial thickening and slight increased interstitial markings suggesting bronchitis. No infiltrates or effusions. The bony thorax is intact. IMPRESSION: Suspect mild bronchitis but no definite infiltrates. Electronically Signed   By: Marijo Sanes M.D.   On: 02/21/2016 17:57   I have personally reviewed and evaluated these images as part of my medical decision-making.   MDM   Final diagnoses:  Bronchitis  Viral syndrome    CXR shows bronchitic changes but no infiltrate. Likely viral bronchitis/viral syndrome. Possibly the flu, however pt is >48 hours out from symptom onset. Will hold  off on abx at this time. rx given for prednisone and supportive meds. Instructed to f/u with pcp next week. ER return precautions given.  I personally performed the services described in this documentation, which was scribed in my presence. The recorded information has been reviewed and is accurate.   Anne Ng, PA-C 02/21/16 1846  Julianne Rice, MD 02/21/16 [phone]

## 2016-02-21 NOTE — ED Notes (Signed)
Patient transported to X-ray 

## 2016-02-21 NOTE — ED Notes (Signed)
Pt c/o productive cough, chills and body aches x's 5 days.  St's unable to sleep at night.

## 2016-02-21 NOTE — ED Notes (Signed)
Declined W/C at D/C and was escorted to lobby by RN. 

## 2016-02-21 NOTE — Discharge Instructions (Signed)
Please call your primary care provider to schedule a follow up appointment within one week. In the meantime I will give you several prescriptions to help with your symptoms. You do not need antibiotics at this time. Return to the ER for new or worsening symptoms.   Upper Respiratory Infection, Adult Most upper respiratory infections (URIs) are a viral infection of the air passages leading to the lungs. A URI affects the nose, throat, and upper air passages. The most common type of URI is nasopharyngitis and is typically referred to as "the common cold." URIs run their course and usually go away on their own. Most of the time, a URI does not require medical attention, but sometimes a bacterial infection in the upper airways can follow a viral infection. This is called a secondary infection. Sinus and middle ear infections are common types of secondary upper respiratory infections. Bacterial pneumonia can also complicate a URI. A URI can worsen asthma and chronic obstructive pulmonary disease (COPD). Sometimes, these complications can require emergency medical care and may be life threatening.  CAUSES Almost all URIs are caused by viruses. A virus is a type of germ and can spread from one person to another.  RISKS FACTORS You may be at risk for a URI if:   You smoke.   You have chronic heart or lung disease.  You have a weakened defense (immune) system.   You are very young or very old.   You have nasal allergies or asthma.  You work in crowded or poorly ventilated areas.  You work in health care facilities or schools. SIGNS AND SYMPTOMS  Symptoms typically develop 2-3 days after you come in contact with a cold virus. Most viral URIs last 7-10 days. However, viral URIs from the influenza virus (flu virus) can last 14-18 days and are typically more severe. Symptoms may include:   Runny or stuffy (congested) nose.   Sneezing.   Cough.   Sore throat.   Headache.   Fatigue.    Fever.   Loss of appetite.   Pain in your forehead, behind your eyes, and over your cheekbones (sinus pain).  Muscle aches.  DIAGNOSIS  Your health care provider may diagnose a URI by:  Physical exam.  Tests to check that your symptoms are not due to another condition such as:  Strep throat.  Sinusitis.  Pneumonia.  Asthma. TREATMENT  A URI goes away on its own with time. It cannot be cured with medicines, but medicines may be prescribed or recommended to relieve symptoms. Medicines may help:  Reduce your fever.  Reduce your cough.  Relieve nasal congestion. HOME CARE INSTRUCTIONS   Take medicines only as directed by your health care provider.   Gargle warm saltwater or take cough drops to comfort your throat as directed by your health care provider.  Use a warm mist humidifier or inhale steam from a shower to increase air moisture. This may make it easier to breathe.  Drink enough fluid to keep your urine clear or pale yellow.   Eat soups and other clear broths and maintain good nutrition.   Rest as needed.   Return to work when your temperature has returned to normal or as your health care provider advises. You may need to stay home longer to avoid infecting others. You can also use a face mask and careful hand washing to prevent spread of the virus.  Increase the usage of your inhaler if you have asthma.   Do not use  any tobacco products, including cigarettes, chewing tobacco, or electronic cigarettes. If you need help quitting, ask your health care provider. PREVENTION  The best way to protect yourself from getting a cold is to practice good hygiene.   Avoid oral or hand contact with people with cold symptoms.   Wash your hands often if contact occurs.  There is no clear evidence that vitamin C, vitamin E, echinacea, or exercise reduces the chance of developing a cold. However, it is always recommended to get plenty of rest, exercise, and  practice good nutrition.  SEEK MEDICAL CARE IF:   You are getting worse rather than better.   Your symptoms are not controlled by medicine.   You have chills.  You have worsening shortness of breath.  You have brown or red mucus.  You have yellow or brown nasal discharge.  You have pain in your face, especially when you bend forward.  You have a fever.  You have swollen neck glands.  You have pain while swallowing.  You have white areas in the back of your throat. SEEK IMMEDIATE MEDICAL CARE IF:   You have severe or persistent:  Headache.  Ear pain.  Sinus pain.  Chest pain.  You have chronic lung disease and any of the following:  Wheezing.  Prolonged cough.  Coughing up blood.  A change in your usual mucus.  You have a stiff neck.  You have changes in your:  Vision.  Hearing.  Thinking.  Mood. MAKE SURE YOU:   Understand these instructions.  Will watch your condition.  Will get help right away if you are not doing well or get worse.   This information is not intended to replace advice given to you by your health care provider. Make sure you discuss any questions you have with your health care provider.   Document Released: 05/13/2001 Document Revised: 04/03/2015 Document Reviewed: 02/22/2014 Elsevier Interactive Patient Education [employer].

## 2016-12-01 HISTORY — PX: ROTATOR CUFF REPAIR: SHX139

## 2018-01-13 ENCOUNTER — Other Ambulatory Visit: Payer: Self-pay | Admitting: Sports Medicine

## 2018-01-13 DIAGNOSIS — M25511 Pain in right shoulder: Secondary | ICD-10-CM

## 2018-01-13 DIAGNOSIS — M25512 Pain in left shoulder: Secondary | ICD-10-CM

## 2018-01-22 ENCOUNTER — Encounter (HOSPITAL_COMMUNITY): Payer: Self-pay | Admitting: Emergency Medicine

## 2018-01-22 DIAGNOSIS — R21 Rash and other nonspecific skin eruption: Secondary | ICD-10-CM | POA: Diagnosis present

## 2018-01-22 DIAGNOSIS — E119 Type 2 diabetes mellitus without complications: Secondary | ICD-10-CM | POA: Diagnosis not present

## 2018-01-22 DIAGNOSIS — I10 Essential (primary) hypertension: Secondary | ICD-10-CM | POA: Insufficient documentation

## 2018-01-22 DIAGNOSIS — Z79899 Other long term (current) drug therapy: Secondary | ICD-10-CM | POA: Insufficient documentation

## 2018-01-22 DIAGNOSIS — Z794 Long term (current) use of insulin: Secondary | ICD-10-CM | POA: Insufficient documentation

## 2018-01-22 DIAGNOSIS — L299 Pruritus, unspecified: Secondary | ICD-10-CM | POA: Diagnosis not present

## 2018-01-22 NOTE — ED Triage Notes (Signed)
Patient c/o generalized rash covering body onset of yesterday. Small red bumps noted on arms and trunk of patient. Denies any new lotions, body washes, detergents or food. Denies any difficulties breathing. Pt states took 2 benadryl about an hour ago.

## 2018-01-23 ENCOUNTER — Emergency Department (HOSPITAL_COMMUNITY)
Admission: EM | Admit: 2018-01-23 | Discharge: 2018-01-23 | Disposition: A | Discharge status: Home / Self Care | Payer: BC Managed Care – PPO | Attending: Emergency Medicine | Admitting: Emergency Medicine

## 2018-01-23 ENCOUNTER — Ambulatory Visit
Admission: RE | Admit: 2018-01-23 | Discharge: 2018-01-23 | Disposition: A | Discharge status: Home / Self Care | Payer: BC Managed Care – PPO | Source: Ambulatory Visit | Attending: Sports Medicine | Admitting: Sports Medicine

## 2018-01-23 DIAGNOSIS — M25512 Pain in left shoulder: Secondary | ICD-10-CM

## 2018-01-23 DIAGNOSIS — R21 Rash and other nonspecific skin eruption: Secondary | ICD-10-CM

## 2018-01-23 DIAGNOSIS — M25511 Pain in right shoulder: Secondary | ICD-10-CM

## 2018-01-23 LAB — CBG MONITORING, ED: GLUCOSE-CAPILLARY: 132 mg/dL — AB (ref 65–99)

## 2018-01-23 MED ORDER — DEXAMETHASONE SODIUM PHOSPHATE 10 MG/ML IJ SOLN
10.0000 mg | Freq: Once | INTRAMUSCULAR | Status: AC
  Administered 2018-01-23: 10 mg via INTRAMUSCULAR
  Filled 2018-01-23: qty 1

## 2018-01-23 MED ORDER — DIPHENHYDRAMINE HCL 25 MG PO CAPS
25.0000 mg | ORAL_CAPSULE | Freq: Once | ORAL | Status: AC
  Administered 2018-01-23: 25 mg via ORAL
  Filled 2018-01-23: qty 1

## 2018-01-23 MED ORDER — HYDROXYZINE HCL 25 MG PO TABS: 25.0000 mg | ORAL_TABLET | Freq: Four times a day (QID) | ORAL | 0 refills | Status: DC

## 2018-01-23 NOTE — Discharge Instructions (Signed)
Contact a health care provider if: Your condition does not improve with treatment. Your condition gets worse. You have signs of infection such as swelling, tenderness, redness, soreness, or warmth in the affected area. You have a fever. You have new symptoms. Get help right away if: You have a severe headache, neck pain, or neck stiffness. You vomit. You feel very sleepy. You notice red streaks coming from the affected area. Your bone or joint underneath the affected area becomes painful after the skin has healed. The affected area turns darker. You have difficulty breathing.

## 2018-01-23 NOTE — ED Provider Notes (Signed)
[facility] DEPT Provider Note   CSN: [id number] Arrival date & time: 01/22/18  2122     History   Chief Complaint Chief Complaint  Patient presents with  . Rash    HPI Meagan [name] is a 57 y.o. female who presents the emergency department chief complaint of pruritic rash.  She had onset of the rash yesterday.  It is generalized from her neck to her toes.  Described as extremely itchy.  She took Benadryl prior prior to arrival.  She denies any new lotions soaps, detergents, or new medications.  She states that she does think she was bitten by a bedbug about 2 weeks ago.  She denies any recent viral illnesses or sore throat.  HPI  Past Medical History:  Diagnosis Date  . Diabetes mellitus ([place])   . GERD (gastroesophageal reflux disease)   . Hyperlipidemia   . Hypertension     There are no active problems to display for this patient.   Past Surgical History:  Procedure Laterality Date  . DILATION AND CURETTAGE OF UTERUS  2007  . TUBAL LIGATION  1992    OB History    No data available       Home Medications    Prior to Admission medications   Medication Sig Start Date End Date Taking? Authorizing Provider  benzonatate (TESSALON) 100 MG capsule Take 1 capsule (100 mg total) by mouth every 8 (eight) hours. 12/10/15   Dorie Rank, MD  benzonatate (TESSALON) 100 MG capsule Take 1 capsule (100 mg total) by mouth every 8 (eight) hours. 02/21/16   Sam, Olivia Canter, PA-C  canagliflozin (INVOKANA) 300 MG TABS tablet Take 300 mg by mouth daily before breakfast.    [provider]  CRESTOR 10 MG tablet Take 10 mg by mouth daily.  06/14/12   [provider]  fish oil-omega-3 fatty acids 1000 MG capsule Take 2 g by mouth daily.    [provider]  ibuprofen (ADVIL,MOTRIN) 200 MG tablet Take 800 mg by mouth every 6 (six) hours as needed for pain.    [provider]  ibuprofen (ADVIL,MOTRIN) 800 MG tablet Take 1 tablet  (800 mg total) by mouth 3 (three) times daily. 02/21/16   Sam, Olivia Canter, PA-C  insulin aspart (NOVOLOG) 100 UNIT/ML injection Inject 12-14 Units into the skin 2 (two) times daily. 12 units in the morning and 14 units in the evening.    [provider]  Insulin Degludec (TRESIBA FLEXTOUCH) 200 UNIT/ML SOPN Inject 54 Units into the skin every evening.    [provider]  lisinopril-hydrochlorothiazide (PRINZIDE,ZESTORETIC) 10-12.5 MG per tablet Take 1 tablet by mouth daily.    [provider]  Multiple Vitamins-Minerals (MULTIVITAMIN WITH MINERALS) tablet Take 1 tablet by mouth daily.    [provider]  ondansetron (ZOFRAN ODT) 4 MG disintegrating tablet Take 1 tablet (4 mg total) by mouth every 8 (eight) hours as needed for nausea or vomiting. 02/21/16   Sam, Olivia Canter, PA-C  predniSONE (DELTASONE) 20 MG tablet Take 2 tablets (40 mg total) by mouth daily. 02/21/16   Sam, Olivia Canter, PA-C  PRILOSEC OTC 20 MG tablet Take 20 mg by mouth daily.  08/23/12   [provider]  sertraline (ZOLOFT) 50 MG tablet Take 50 mg by mouth daily.    [provider]  sitaGLIPtin-metformin (JANUMET) 50-1000 MG tablet Take 1 tablet by mouth 2 (two) times daily with a meal.    [provider]  Family History Family History  Problem Relation Age of Onset  . Colon cancer Neg Hx   . Stomach cancer Neg Hx     Social History Social History   Tobacco Use  . Smoking status: Never Smoker  . Smokeless tobacco: Never Used  Substance Use Topics  . Alcohol use: Yes    Comment: occasional  . Drug use: No     Allergies   Phenothiazines   Review of Systems Review of Systems  Constitutional: Negative for chills and fever.  Skin: Positive for rash.     Physical Exam Updated Vital Signs BP (!) 166/65 (BP Location: Left Arm)   Pulse (!) 109   Temp 97.8 F (36.6 C) (Oral)   Resp 18   Ht 5\' 3"  (1.6 m)   Wt 98.4 kg (217 lb)   SpO2 100%   BMI 38.44  kg/m   Physical Exam  Constitutional: She is oriented to person, place, and time. She appears well-developed and well-nourished. No distress.  HENT:  Head: Normocephalic and atraumatic.  Eyes: Conjunctivae are normal. No scleral icterus.  Neck: Normal range of motion.  Cardiovascular: Normal rate, regular rhythm and normal heart sounds. Exam reveals no gallop and no friction rub.  No murmur heard. Pulmonary/Chest: Effort normal and breath sounds normal. No respiratory distress.  Abdominal: Soft. Bowel sounds are normal. She exhibits no distension and no mass. There is no tenderness. There is no guarding.  Neurological: She is alert and oriented to person, place, and time.  Skin: Skin is warm and dry. She is not diaphoretic.  Fine, erythematous papular rash consistent both Singulair and confluent regions of cross thorax, arms, legs.  Psychiatric: Her behavior is normal.  Nursing note and vitals reviewed.    ED Treatments / Results  Labs (all labs ordered are listed, but only abnormal results are displayed) Labs Reviewed  CBG MONITORING, ED    EKG  EKG Interpretation None       Radiology No results found.  Procedures Procedures (including critical care time)  Medications Ordered in ED Medications - No data to display   Initial Impression / Assessment and Plan / ED Course  I have reviewed the triage vital signs and the nursing notes.  Pertinent labs & imaging results that were available during my care of the patient were reviewed by me and considered in my medical decision making (see chart for details).     Patient with nonspecific pruritic eruption of the skin.  No known trigger.  No evidence of SJS or TENS.  Treated with a single dose of Decadron and warned that her blood sugars may elevate.  Given Vistaril at discharge she may follow with her PCP closely on Monday.  Final Clinical Impressions(s) / ED Diagnoses   Final diagnoses:  Rash and nonspecific skin  eruption    ED Discharge Orders    None       Margarita Mail, PA-C 01/23/18 0547    Ward, Delice Bison, DO 01/23/18 [phone]

## 2018-01-23 NOTE — ED Notes (Signed)
Bed: WTR7 Expected date:  Expected time:  Means of arrival:  Comments: 

## 2018-08-31 ENCOUNTER — Other Ambulatory Visit: Payer: Self-pay | Admitting: Nurse Practitioner

## 2018-09-05 ENCOUNTER — Other Ambulatory Visit: Payer: Self-pay | Admitting: Internal Medicine

## 2018-09-07 ENCOUNTER — Other Ambulatory Visit: Payer: Self-pay

## 2018-09-07 ENCOUNTER — Telehealth: Payer: Self-pay

## 2018-09-07 NOTE — Telephone Encounter (Signed)
Message left that vitamin d refill  Faxed to the pharmacy.

## 2018-09-16 ENCOUNTER — Encounter: Payer: Self-pay | Admitting: Internal Medicine

## 2018-09-16 ENCOUNTER — Ambulatory Visit (INDEPENDENT_AMBULATORY_CARE_PROVIDER_SITE_OTHER): Payer: BC Managed Care – PPO | Admitting: Internal Medicine

## 2018-09-16 VITALS — BP 158/76 | HR 88 | Temp 98.2°F | Ht 63.0 in | Wt 186.6 lb

## 2018-09-16 DIAGNOSIS — Z86010 Personal history of colonic polyps: Secondary | ICD-10-CM

## 2018-09-16 DIAGNOSIS — Z23 Encounter for immunization: Secondary | ICD-10-CM

## 2018-09-16 DIAGNOSIS — Z79899 Other long term (current) drug therapy: Secondary | ICD-10-CM

## 2018-09-16 DIAGNOSIS — Z6833 Body mass index (BMI) 33.0-33.9, adult: Secondary | ICD-10-CM

## 2018-09-16 DIAGNOSIS — Z1239 Encounter for other screening for malignant neoplasm of breast: Secondary | ICD-10-CM

## 2018-09-16 DIAGNOSIS — E1122 Type 2 diabetes mellitus with diabetic chronic kidney disease: Secondary | ICD-10-CM | POA: Insufficient documentation

## 2018-09-16 DIAGNOSIS — R197 Diarrhea, unspecified: Secondary | ICD-10-CM

## 2018-09-16 DIAGNOSIS — Z5181 Encounter for therapeutic drug level monitoring: Secondary | ICD-10-CM

## 2018-09-16 DIAGNOSIS — N182 Chronic kidney disease, stage 2 (mild): Secondary | ICD-10-CM

## 2018-09-16 DIAGNOSIS — Z794 Long term (current) use of insulin: Secondary | ICD-10-CM

## 2018-09-16 DIAGNOSIS — I129 Hypertensive chronic kidney disease with stage 1 through stage 4 chronic kidney disease, or unspecified chronic kidney disease: Secondary | ICD-10-CM | POA: Insufficient documentation

## 2018-09-16 DIAGNOSIS — E6609 Other obesity due to excess calories: Secondary | ICD-10-CM

## 2018-09-16 NOTE — Progress Notes (Signed)
Subjective:     Patient ID: Meagan [name] , female    DOB: 1961/02/20 , 57 y.o.   MRN: [id number]   Diabetes  She presents for her follow-up diabetic visit. She has type 2 diabetes mellitus. Her disease course has been improving. There are no hypoglycemic associated symptoms. Pertinent negatives for diabetes include no blurred vision, no chest pain, no fatigue and no visual change. There are no hypoglycemic complications. Symptoms are improving. Diabetic complications include nephropathy. Risk factors for coronary artery disease include hypertension, obesity and diabetes mellitus. Current diabetic treatment includes insulin injections and oral agent (monotherapy). She is compliant with treatment most of the time. Her breakfast blood glucose is taken between 9-10 am. Her breakfast blood glucose range is generally 110-130 mg/dl.  Hypertension  This is a chronic problem. The current episode started more than 1 year ago. Pertinent negatives include no blurred vision or chest pain.   SHE REPORTS COMPLIANCE WITH MEDS.   Past Medical History:  Diagnosis Date  . Diabetes mellitus ([place])   . GERD (gastroesophageal reflux disease)   . Hyperlipidemia   . Hypertension       Current Outpatient Medications:  .  empagliflozin (JARDIANCE) 10 MG TABS tablet, Take 10 mg by mouth daily., Disp: , Rfl:  .  fish oil-omega-3 fatty acids 1000 MG capsule, Take 2 g by mouth daily., Disp: , Rfl:  .  ibuprofen (ADVIL,MOTRIN) 800 MG tablet, Take 1 tablet (800 mg total) by mouth 3 (three) times daily., Disp: 21 tablet, Rfl: 0 .  insulin aspart (NOVOLOG) 100 UNIT/ML injection, Inject 16 Units into the skin 3 (three) times daily with meals. 12 units in the morning and 14 units in the evening., Disp: , Rfl:  .  Insulin Degludec (TRESIBA FLEXTOUCH) 200 UNIT/ML SOPN, Inject 66 Units into the skin every evening. , Disp: , Rfl:  .  JANUMET 50-1000 MG tablet, TAKE 1 TABLET BY MOUTH TWICE A DAY, Disp: 180 tablet, Rfl: 1 .   lisinopril-hydrochlorothiazide (PRINZIDE,ZESTORETIC) 10-12.5 MG per tablet, Take 1 tablet by mouth daily., Disp: , Rfl:  .  Multiple Vitamins-Minerals (MULTIVITAMIN WITH MINERALS) tablet, Take 1 tablet by mouth daily., Disp: , Rfl:  .  PRILOSEC OTC 20 MG tablet, Take 20 mg by mouth daily. , Disp: , Rfl:  .  sertraline (ZOLOFT) 50 MG tablet, Take 50 mg by mouth daily., Disp: , Rfl:  .  Vitamin D, Ergocalciferol, (DRISDOL) 50000 units CAPS capsule, Take 50,000 Units by mouth 2 (two) times a week., Disp: , Rfl:  .  lisinopril (PRINIVIL,ZESTRIL) 10 MG tablet, TAKE 1 TABLET BY MOUTH EVERY DAY, Disp: 90 tablet, Rfl: 1 .  meloxicam (MOBIC) 15 MG tablet, Take 15 mg by mouth daily. with food, Disp: , Rfl: 1   Allergies  Allergen Reactions  . Phenothiazines     Bells palsy     Review of Systems  Constitutional: Negative.  Negative for fatigue.  Eyes: Negative.  Negative for blurred vision.  Respiratory: Negative.   Cardiovascular: Negative.  Negative for chest pain.  Gastrointestinal: Positive for diarrhea (SHE DENIES FEVER/CHILLS. NOT SURE WHAT MAY HAVE TRIGGERED HER SX. NO RECENT TRAVEL. NO BLOODY STOOLS. ).  Genitourinary: Negative.   Neurological: Negative.   Hematological: Adenopathy:   Psychiatric/Behavioral: Negative.      Today's Vitals   09/16/18 1015  BP: (!) 158/76  Pulse: 88  Temp: 98.2 F (36.8 C)  TempSrc: Oral  Weight: 186 lb 9.6 oz (84.6 kg)  Height: 5\' 3"  (1.6 m)  Body mass index is 33.05 kg/m.   Objective:  Physical Exam  Constitutional: She appears well-developed and well-nourished.  HENT:  Head: Normocephalic and atraumatic.  Eyes: EOM are normal.  Neck: Normal range of motion. Neck supple.  Cardiovascular: Normal rate, regular rhythm, normal heart sounds and intact distal pulses.  Pulmonary/Chest: Effort normal and breath sounds normal.  Skin: Skin is warm and dry.  Psychiatric: She has a normal mood and affect.  Nursing note and vitals reviewed.        Assessment And Plan:     1. Type 2 diabetes mellitus with stage 2 chronic kidney disease, with long-term current use of insulin (HCC)  I WILL CHECK CMET, HBA1C TODAY. SHE IS ENCOURAGED TO EXERCISE NO LESS THAN FIVE DAYS WEEKLY, 30 MINUTES OR MORE.   2. Chronic kidney disease (CKD), stage II (mild)  CHRONIC. I WILL CHECK A GFR, CR TODAY. SHE IS ENCOURAGED TO STAY WELL HYDRATED.   3. Benign hypertensive renal disease  UNCONTROLLED. SHE WAS GIVEN REFILL OF HER MEDS. SHE IS ENCOURAGED TO AVOID ADDING SALT TO HER FOODS.   4. Need for immunization against influenza  - Flu Vaccine QUAD 6+ mos PF IM (Fluarix Quad PF)  5. Encounter for monitoring antihypertensive use   6. Diarrhea, unspecified type  SHE IS ENCOURAGED TO AVOID ARTIFICIAL SWEETENERS. SHE WILL LET ME KNOW IF HER SX PERSIST.    8. Class 1 obesity due to excess calories with serious comorbidity and body mass index (BMI) of 33.0 to 33.9 in adult  SHE IS ENCOURAGED TO STRIVE FOR BMI LESS THAN 30 TO DECREASE CARDIAC RISK. SHE  WAS ALSO CONGRATULATED ON HER WEIGHT LOSS THUS FAR.   9. Personal history of colonic polyps  I WILL REFER HER TO GI FOR CRC SCREENING.   - Ambulatory referral to Gastroenterology  10. Breast cancer screening  I WILL REFER HER FOR ANNUAL MAMMOGRAM.  - MM Digital Screening; Future   [name], MD

## 2018-09-17 LAB — CMP14+EGFR
A/G RATIO: 1.1 — AB (ref 1.2–2.2)
ALBUMIN: 3.5 g/dL (ref 3.5–5.5)
ALT: 10 IU/L (ref 0–32)
AST: 9 IU/L (ref 0–40)
Alkaline Phosphatase: 68 IU/L (ref 39–117)
BUN / CREAT RATIO: 17 (ref 9–23)
BUN: 15 mg/dL (ref 6–24)
Bilirubin Total: <0.2 mg/dL (ref 0.0–1.2)
CALCIUM: 9.2 mg/dL (ref 8.7–10.2)
CO2: 20 mmol/L (ref 20–29)
CREATININE: 0.87 mg/dL (ref 0.57–1.00)
Chloride: 107 mmol/L — ABNORMAL HIGH (ref 96–106)
GFR, EST AFRICAN AMERICAN: 86 mL/min/{1.73_m2} (ref >59)
GFR, EST NON AFRICAN AMERICAN: 74 mL/min/{1.73_m2} (ref >59)
GLOBULIN, TOTAL: 3.3 g/dL (ref 1.5–4.5)
Glucose: 84 mg/dL (ref 65–99)
POTASSIUM: 4.6 mmol/L (ref 3.5–5.2)
SODIUM: 143 mmol/L (ref 134–144)
TOTAL PROTEIN: 6.8 g/dL (ref 6.0–8.5)

## 2018-09-17 LAB — HEMOGLOBIN A1C
ESTIMATED AVERAGE GLUCOSE: 140 mg/dL
Hgb A1c MFr Bld: 6.5 % — ABNORMAL HIGH (ref 4.8–5.6)

## 2018-09-28 ENCOUNTER — Other Ambulatory Visit: Payer: Self-pay | Admitting: Internal Medicine

## 2018-10-07 ENCOUNTER — Other Ambulatory Visit: Payer: Self-pay

## 2018-10-07 MED ORDER — INSULIN PEN NEEDLE 31G X 8 MM MISC: 3 refills | Status: DC

## 2018-10-11 ENCOUNTER — Other Ambulatory Visit: Payer: Self-pay | Admitting: Internal Medicine

## 2018-10-26 ENCOUNTER — Other Ambulatory Visit: Payer: Self-pay | Admitting: Nurse Practitioner

## 2018-12-20 ENCOUNTER — Ambulatory Visit: Payer: BC Managed Care – PPO | Admitting: Internal Medicine

## 2019-01-05 ENCOUNTER — Encounter: Payer: Self-pay | Admitting: Internal Medicine

## 2019-01-26 ENCOUNTER — Other Ambulatory Visit: Payer: Self-pay | Admitting: Internal Medicine

## 2019-02-05 ENCOUNTER — Other Ambulatory Visit: Payer: Self-pay | Admitting: Internal Medicine

## 2019-02-20 ENCOUNTER — Other Ambulatory Visit: Payer: Self-pay | Admitting: Internal Medicine

## 2019-02-25 ENCOUNTER — Encounter (HOSPITAL_COMMUNITY): Payer: Self-pay | Admitting: Emergency Medicine

## 2019-02-25 ENCOUNTER — Ambulatory Visit (HOSPITAL_COMMUNITY)
Admission: EM | Admit: 2019-02-25 | Discharge: 2019-02-25 | Disposition: A | Discharge status: Home / Self Care | Payer: BC Managed Care – PPO | Attending: Emergency Medicine | Admitting: Emergency Medicine

## 2019-02-25 ENCOUNTER — Other Ambulatory Visit: Payer: Self-pay

## 2019-02-25 DIAGNOSIS — J40 Bronchitis, not specified as acute or chronic: Secondary | ICD-10-CM

## 2019-02-25 DIAGNOSIS — J22 Unspecified acute lower respiratory infection: Secondary | ICD-10-CM

## 2019-02-25 MED ORDER — AZITHROMYCIN 250 MG PO TABS: ORAL_TABLET | ORAL | 0 refills | Status: AC

## 2019-02-25 MED ORDER — ALBUTEROL SULFATE HFA 108 (90 BASE) MCG/ACT IN AERS: 1.0000 | INHALATION_SPRAY | Freq: Four times a day (QID) | RESPIRATORY_TRACT | 0 refills | Status: DC | PRN

## 2019-02-25 MED ORDER — PREDNISONE 20 MG PO TABS: 40.0000 mg | ORAL_TABLET | Freq: Every day | ORAL | 0 refills | Status: AC

## 2019-02-25 NOTE — Discharge Instructions (Signed)
Stop antibiotics you have at home.  Complete course I have sent for you.  5 days of prednisone. Please monitor your blood sugar as this will increase your blood sugar.  Use of inhaler as needed for wheezing or shortness of breath.   Tylenol as needed for fever, headache, or body aches.  Please start daily flonase.  If worsening of symptoms- chest pain , shortness of breath , increased worse of breathing please to go to the ER.  Wash hands regularly, cover your cough.

## 2019-02-25 NOTE — ED Triage Notes (Signed)
Per pt she has been having cough for about 3 weeks and says thick mucus in her throat and nose. HX bronchitis and pneumonia. Pt said she was running fever but has not for days now. Nausea feeling today bc of cough and mucus.

## 2019-02-25 NOTE — ED Provider Notes (Signed)
[facility]    CSN: [id number] Arrival date & time: 02/25/19  1221     History   Chief Complaint Chief Complaint  Patient presents with  . Cough  . Nausea    HPI Meagan [name] is a 58 y.o. female.   [name] presents with complaints of cough for the past 3 weeks. Cough is productive. Has felt body aches and headache. She feels somewhat worse. Nausea due to coughing. Had a fever and sore throat initially which have since improved. No known ill contacts. No recent travel. Some nasal congestion. Doesn't smoke. No history of COPD. Has been taking left over tessalon and bactrim which hasn't helped with her symptoms. States once her symptoms started she quarantined for 14 days. Hx of DM, gerd, htn. No chest pain     ROS per HPI, negative if not otherwise mentioned.      Past Medical History:  Diagnosis Date  . Diabetes mellitus ([place])   . GERD (gastroesophageal reflux disease)   . Hyperlipidemia   . Hypertension     Patient Active Problem List   Diagnosis Date Noted  . Type 2 diabetes mellitus with stage 2 chronic kidney disease, with long-term current use of insulin ([place]) 09/16/2018  . Chronic kidney disease (CKD), stage II (mild) 09/16/2018  . Benign hypertensive renal disease 09/16/2018    Past Surgical History:  Procedure Laterality Date  . DILATION AND CURETTAGE OF UTERUS  2007  . TUBAL LIGATION  1992    OB History   No obstetric history on file.      Home Medications    Prior to Admission medications   Medication Sig Start Date End Date Taking? Authorizing Provider  albuterol (PROAIR HFA) 108 (90 Base) MCG/ACT inhaler Inhale 1-2 puffs into the lungs every 6 (six) hours as needed for wheezing or shortness of breath. 02/25/19   Zigmund Gottron, NP  azithromycin (ZITHROMAX) 250 MG tablet Take 2 tablets (500 mg total) by mouth daily for 1 day, THEN 1 tablet (250 mg total) daily for 4 days. 02/25/19 03/02/19  Zigmund Gottron, NP  fish  oil-omega-3 fatty acids 1000 MG capsule Take 2 g by mouth daily.    [provider]  ibuprofen (ADVIL,MOTRIN) 800 MG tablet Take 1 tablet (800 mg total) by mouth 3 (three) times daily. 02/21/16   Sam, Olivia Canter, PA-C  insulin aspart (NOVOLOG) 100 UNIT/ML injection INJECT SUBCUTANEOUSLY PER SLIDING SCALE 01/26/19   Glendale Chard, MD  Insulin Pen Needle (B-D ULTRAFINE III SHORT PEN) 31G X 8 MM MISC Use as directed with insulin pen 10/07/18   Glendale Chard, MD  JANUMET 50-1000 MG tablet TAKE 1 TABLET BY MOUTH TWICE A DAY 02/21/19   Glendale Chard, MD  JARDIANCE 10 MG TABS tablet TAKE 1 TABLET BY MOUTH EVERY MORNING 09/28/18   Glendale Chard, MD  lisinopril (PRINIVIL,ZESTRIL) 10 MG tablet TAKE 1 TABLET BY MOUTH EVERY DAY 09/06/18   Glendale Chard, MD  lisinopril-hydrochlorothiazide (PRINZIDE,ZESTORETIC) 10-12.5 MG per tablet Take 1 tablet by mouth daily.    [provider]  meloxicam (MOBIC) 15 MG tablet Take 15 mg by mouth daily. with food 07/11/18   [provider]  Multiple Vitamins-Minerals (MULTIVITAMIN WITH MINERALS) tablet Take 1 tablet by mouth daily.    [provider]  predniSONE (DELTASONE) 20 MG tablet Take 2 tablets (40 mg total) by mouth daily with breakfast for 5 days. 02/25/19 03/02/19  Zigmund Gottron, NP  PRILOSEC OTC 20 MG tablet Take  20 mg by mouth daily.  08/23/12   [provider]  sertraline (ZOLOFT) 50 MG tablet TAKE 1 TABLET BY MOUTH DAILY 10/11/18   Glendale Chard, MD  TRESIBA FLEXTOUCH 200 UNIT/ML SOPN INJECT 66 UNITS SQ DAILY 11/16/18   Glendale Chard, MD  Vitamin D, Ergocalciferol, (DRISDOL) 50000 units CAPS capsule Take 50,000 Units by mouth 2 (two) times a week.    [provider]    Family History Family History  Problem Relation Age of Onset  . Kidney failure Mother   . Liver disease Mother   . Hypertension Brother   . Colon cancer Neg Hx   . Stomach cancer Neg Hx     Social History Social History   Tobacco Use   . Smoking status: Never Smoker  . Smokeless tobacco: Never Used  Substance Use Topics  . Alcohol use: Yes    Comment: occasional  . Drug use: Never     Allergies   Phenothiazines   Review of Systems Review of Systems   Physical Exam Triage Vital Signs ED Triage Vitals  Enc Vitals Group     BP 02/25/19 1325 140/86     Pulse Rate 02/25/19 1325 98     Resp 02/25/19 1325 16     Temp 02/25/19 1325 98.4 F (36.9 C)     Temp Source 02/25/19 1325 Oral     SpO2 02/25/19 1325 98 %     Weight --      Height --      Head Circumference --      Peak Flow --      Pain Score 02/25/19 1323 2     Pain Loc --      Pain Edu? --      Excl. in [place]? --    No data found.  Updated Vital Signs BP 140/86 (BP Location: Right Arm)   Pulse 98   Temp 98.4 F (36.9 C) (Oral)   Resp 16   SpO2 98%   Visual Acuity Right Eye Distance:   Left Eye Distance:   Bilateral Distance:    Right Eye Near:   Left Eye Near:    Bilateral Near:     Physical Exam Constitutional:      General: She is not in acute distress.    Appearance: She is well-developed.  HENT:     Head: Normocephalic and atraumatic.     Right Ear: Tympanic membrane, ear canal and external ear normal.     Left Ear: Tympanic membrane, ear canal and external ear normal.     Nose: Nose normal.     Mouth/Throat:     Pharynx: Uvula midline.     Tonsils: No tonsillar exudate.  Eyes:     Conjunctiva/sclera: Conjunctivae normal.     Pupils: Pupils are equal, round, and reactive to light.  Cardiovascular:     Rate and Rhythm: Normal rate and regular rhythm.     Heart sounds: Normal heart sounds.  Pulmonary:     Effort: Pulmonary effort is normal.     Breath sounds: Examination of the right-lower field reveals decreased breath sounds. Examination of the left-lower field reveals decreased breath sounds. Decreased breath sounds present.     Comments: Occasional strong dry cough noted  Skin:    General: Skin is warm and dry.   Neurological:     Mental Status: She is alert and oriented to person, place, and time.      UC Treatments / Results  Labs (  all labs ordered are listed, but only abnormal results are displayed) Labs Reviewed - No data to display  EKG None  Radiology No results found.  Procedures Procedures (including critical care time)  Medications Ordered in UC Medications - No data to display  Initial Impression / Assessment and Plan / UC Course  I have reviewed the triage vital signs and the nursing notes.  Pertinent labs & imaging results that were available during my care of the patient were reviewed by me and considered in my medical decision making (see chart for details).     Non toxic, afebrile. No increased work of breathing. Longevity of illness concern for bronchitis, will cover with azithromycin and prednisone. Low suspicion for coronavirus. Return precautions provided. Patient verbalized understanding and agreeable to plan.  Ambulatory out of clinic without difficulty.   Final Clinical Impressions(s) / UC Diagnoses   Final diagnoses:  Lower respiratory tract infection  Bronchitis     Discharge Instructions     Stop antibiotics you have at home.  Complete course I have sent for you.  5 days of prednisone. Please monitor your blood sugar as this will increase your blood sugar.  Use of inhaler as needed for wheezing or shortness of breath.   Tylenol as needed for fever, headache, or body aches.  Please start daily flonase.  If worsening of symptoms- chest pain , shortness of breath , increased worse of breathing please to go to the ER.  Wash hands regularly, cover your cough.    ED Prescriptions    Medication Sig Dispense Auth. Provider   azithromycin (ZITHROMAX) 250 MG tablet Take 2 tablets (500 mg total) by mouth daily for 1 day, THEN 1 tablet (250 mg total) daily for 4 days. 6 tablet Augusto Gamble B, NP   predniSONE (DELTASONE) 20 MG tablet Take 2 tablets (40 mg  total) by mouth daily with breakfast for 5 days. 10 tablet Augusto Gamble B, NP   albuterol (PROAIR HFA) 108 (90 Base) MCG/ACT inhaler Inhale 1-2 puffs into the lungs every 6 (six) hours as needed for wheezing or shortness of breath. 1 Inhaler Zigmund Gottron, NP     Controlled Substance Prescriptions [place] Controlled Substance Registry consulted? Not Applicable   Zigmund Gottron, NP 02/25/19 1710

## 2019-03-04 ENCOUNTER — Encounter: Payer: Self-pay | Admitting: Internal Medicine

## 2019-03-07 ENCOUNTER — Other Ambulatory Visit: Payer: Self-pay

## 2019-03-07 MED ORDER — SERTRALINE HCL 50 MG PO TABS: 50.0000 mg | ORAL_TABLET | Freq: Every day | ORAL | 0 refills | Status: DC

## 2019-03-25 ENCOUNTER — Other Ambulatory Visit: Payer: Self-pay | Admitting: Internal Medicine

## 2019-04-06 ENCOUNTER — Other Ambulatory Visit: Payer: Self-pay | Admitting: Internal Medicine

## 2019-05-07 ENCOUNTER — Other Ambulatory Visit: Payer: Self-pay | Admitting: Internal Medicine

## 2019-05-09 ENCOUNTER — Other Ambulatory Visit (HOSPITAL_COMMUNITY)
Admission: RE | Admit: 2019-05-09 | Discharge: 2019-05-09 | Disposition: A | Discharge status: Home / Self Care | Payer: BC Managed Care – PPO | Source: Ambulatory Visit | Attending: Internal Medicine | Admitting: Internal Medicine

## 2019-05-09 ENCOUNTER — Other Ambulatory Visit: Payer: Self-pay

## 2019-05-09 ENCOUNTER — Ambulatory Visit: Payer: BC Managed Care – PPO | Admitting: Internal Medicine

## 2019-05-09 ENCOUNTER — Encounter: Payer: Self-pay | Admitting: Internal Medicine

## 2019-05-09 VITALS — BP 112/68 | HR 94 | Temp 98.4°F | Ht 62.2 in | Wt 192.2 lb

## 2019-05-09 DIAGNOSIS — I129 Hypertensive chronic kidney disease with stage 1 through stage 4 chronic kidney disease, or unspecified chronic kidney disease: Secondary | ICD-10-CM

## 2019-05-09 DIAGNOSIS — N182 Chronic kidney disease, stage 2 (mild): Secondary | ICD-10-CM | POA: Diagnosis not present

## 2019-05-09 DIAGNOSIS — Z01419 Encounter for gynecological examination (general) (routine) without abnormal findings: Secondary | ICD-10-CM

## 2019-05-09 DIAGNOSIS — E1122 Type 2 diabetes mellitus with diabetic chronic kidney disease: Secondary | ICD-10-CM | POA: Diagnosis not present

## 2019-05-09 DIAGNOSIS — Z0000 Encounter for general adult medical examination without abnormal findings: Secondary | ICD-10-CM

## 2019-05-09 DIAGNOSIS — Z1212 Encounter for screening for malignant neoplasm of rectum: Secondary | ICD-10-CM | POA: Diagnosis not present

## 2019-05-09 DIAGNOSIS — W19XXXA Unspecified fall, initial encounter: Secondary | ICD-10-CM

## 2019-05-09 DIAGNOSIS — Z794 Long term (current) use of insulin: Secondary | ICD-10-CM

## 2019-05-09 LAB — POCT URINALYSIS DIPSTICK
Bilirubin, UA: NEGATIVE
Blood, UA: NEGATIVE
Glucose, UA: POSITIVE — AB
Ketones, UA: NEGATIVE
Leukocytes, UA: NEGATIVE
Nitrite, UA: NEGATIVE
Protein, UA: NEGATIVE
Spec Grav, UA: 1.015 (ref 1.010–1.025)
Urobilinogen, UA: 0.2 E.U./dL
pH, UA: 5.5 (ref 5.0–8.0)

## 2019-05-09 LAB — POCT UA - MICROALBUMIN
Albumin/Creatinine Ratio, Urine, POC: 30
Creatinine, POC: 300 mg/dL
Microalbumin Ur, POC: 80 mg/L

## 2019-05-09 LAB — POC HEMOCCULT BLD/STL (OFFICE/1-CARD/DIAGNOSTIC): Fecal Occult Blood, POC: NEGATIVE

## 2019-05-09 MED ORDER — PNEUMOCOCCAL 13-VAL CONJ VACC IM SUSP: 0.5000 mL | INTRAMUSCULAR | 0 refills | Status: AC

## 2019-05-09 NOTE — Patient Instructions (Signed)

## 2019-05-09 NOTE — Progress Notes (Signed)
Subjective:     Patient ID: Meagan [name] , female    DOB: [birth year] , 58 y.o.   MRN: [id number]   Chief Complaint  Patient presents with  . Annual Exam    fall  . Diabetes  . Hypertension    HPI  She is here today for a full physical examination. She admits she has not seen her Gyn in awhile. She is not currently sexually active. Has had several episodes of hypoglycemia.    Diabetes  She presents for her follow-up diabetic visit. She has type 2 diabetes mellitus. There are no hypoglycemic associated symptoms. Pertinent negatives for diabetes include no blurred vision and no chest pain. There are no hypoglycemic complications. Risk factors for coronary artery disease include diabetes mellitus, dyslipidemia, hypertension, obesity, sedentary lifestyle and post-menopausal. She is following a diabetic diet. She participates in exercise intermittently. Her breakfast blood glucose is taken between 8-9 am. Her breakfast blood glucose range is generally 130-140 mg/dl. An ACE inhibitor/angiotensin II receptor blocker is being taken. Eye exam is not current.  Hypertension  This is a chronic problem. The current episode started more than 1 year ago. The problem has been gradually improving since onset. The problem is controlled. Pertinent negatives include no blurred vision or chest pain. Past treatments include ACE inhibitors, angiotensin blockers and diuretics. Compliance problems include exercise.      Past Medical History:  Diagnosis Date  . Diabetes mellitus ([place])   . GERD (gastroesophageal reflux disease)   . Hyperlipidemia   . Hypertension      Family History  Problem Relation Age of Onset  . Kidney failure Mother   . Liver disease Mother   . Early death Father   . Hypertension Brother   . Colon cancer Neg Hx   . Stomach cancer Neg Hx      Current Outpatient Medications:  .  albuterol (PROAIR HFA) 108 (90 Base) MCG/ACT inhaler, Inhale 1-2 puffs into the lungs every 6 (six)  hours as needed for wheezing or shortness of breath., Disp: 1 Inhaler, Rfl: 0 .  Cholecalciferol (DIALYVITE VITAMIN D 5000 PO), Take 2 capsules by mouth daily., Disp: , Rfl:  .  ibuprofen (ADVIL,MOTRIN) 800 MG tablet, Take 1 tablet (800 mg total) by mouth 3 (three) times daily., Disp: 21 tablet, Rfl: 0 .  insulin aspart (NOVOLOG) 100 UNIT/ML injection, INJECT SUBCUTANEOUSLY PER SLIDING SCALE, Disp: 45 vial, Rfl: 3 .  Insulin Pen Needle (B-D ULTRAFINE III SHORT PEN) 31G X 8 MM MISC, Use as directed with insulin pen, Disp: 100 each, Rfl: 3 .  JANUMET 50-1000 MG tablet, TAKE 1 TABLET BY MOUTH TWICE A DAY, Disp: 180 tablet, Rfl: 1 .  JARDIANCE 10 MG TABS tablet, TAKE 1 TABLET BY MOUTH EVERY MORNING, Disp: 30 tablet, Rfl: 0 .  meloxicam (MOBIC) 15 MG tablet, Take 15 mg by mouth daily. with food, Disp: , Rfl: 1 .  Multiple Vitamins-Minerals (MULTIVITAMIN WITH MINERALS) tablet, Take 1 tablet by mouth daily., Disp: , Rfl:  .  PRILOSEC OTC 20 MG tablet, Take 20 mg by mouth daily. , Disp: , Rfl:  .  sertraline (ZOLOFT) 50 MG tablet, Take 1 tablet (50 mg total) by mouth daily., Disp: 90 tablet, Rfl: 0 .  TRESIBA FLEXTOUCH 200 UNIT/ML SOPN, INJECT 66 UNITS SQ DAILY, Disp: 9 pen, Rfl: 3 .  fish oil-omega-3 fatty acids 1000 MG capsule, Take 2 g by mouth daily., Disp: , Rfl:  .  lisinopril (ZESTRIL) 10 MG tablet, TAKE 1  TABLET BY MOUTH EVERY DAY (Patient not taking: Reported on 05/09/2019), Disp: 90 tablet, Rfl: 0   Allergies  Allergen Reactions  . Phenothiazines     Bells palsy       The patient states she uses none for birth control. Last LMP was No LMP recorded. Patient is postmenopausal.. Negative for Dysmenorrhea Negative for: breast discharge, breast lump(s), breast pain and breast self exam. Associated symptoms include abnormal vaginal bleeding. Pertinent negatives include abnormal bleeding (hematology), anxiety, decreased libido, depression, difficulty falling sleep, dyspareunia, history of infertility,  nocturia, sexual dysfunction, sleep disturbances, urinary incontinence, urinary urgency, vaginal discharge and vaginal itching. Diet regular.The patient states her exercise level is  intermittent.   . The patient's tobacco use is:  Social History   Tobacco Use  Smoking Status Never Smoker  Smokeless Tobacco Never Used  . She has been exposed to passive smoke. The patient's alcohol use is:  Social History   Substance and Sexual Activity  Alcohol Use Yes   Comment: occasional    Review of Systems  Constitutional: Negative.   HENT: Negative.   Eyes: Negative.  Negative for blurred vision.  Respiratory: Negative.   Cardiovascular: Negative.  Negative for chest pain.  Gastrointestinal: Negative.   Endocrine: Negative.   Genitourinary: Negative.   Musculoskeletal: Negative.   Skin: Negative.   Allergic/Immunologic: Negative.   Neurological: Negative.   Hematological: Negative.   Psychiatric/Behavioral: Negative.      Today's Vitals   05/09/19 1011  BP: 112/68  Pulse: 94  Temp: 98.4 F (36.9 C)  TempSrc: Oral  Weight: 192 lb 3.2 oz (87.2 kg)  Height: 5' 2.2" (1.58 m)   Body mass index is 34.93 kg/m.   Objective:  Physical Exam Vitals signs and nursing note reviewed. Exam conducted with a chaperone present.  Constitutional:      Appearance: Normal appearance. She is obese.  HENT:     Head: Normocephalic and atraumatic.     Right Ear: Tympanic membrane, ear canal and external ear normal.     Left Ear: Tympanic membrane, ear canal and external ear normal.     Nose: Nose normal.     Mouth/Throat:     Mouth: Mucous membranes are moist.     Pharynx: Oropharynx is clear.  Eyes:     Extraocular Movements: Extraocular movements intact.     Conjunctiva/sclera: Conjunctivae normal.     Pupils: Pupils are equal, [name], and reactive to light.  Neck:     Musculoskeletal: Normal range of motion and neck supple.  Cardiovascular:     Rate and Rhythm: Normal rate and regular  rhythm.     Pulses: Normal pulses.          Dorsalis pedis pulses are 2+ on the right side and 2+ on the left side.     Heart sounds: Normal heart sounds.  Pulmonary:     Effort: Pulmonary effort is normal.     Breath sounds: Normal breath sounds.  Chest:     Breasts:        Right: Normal. No swelling, bleeding, inverted nipple, mass or nipple discharge.        Left: Normal. No swelling, bleeding, inverted nipple, mass or nipple discharge.  Abdominal:     General: Abdomen is flat. Bowel sounds are normal.     Palpations: Abdomen is soft.  Genitourinary:    Exam position: Lithotomy position.     Vagina: Vaginal discharge present.     Cervix: Normal.  Uterus: Normal.      Adnexa: Right adnexa normal and left adnexa normal.     Rectum: Normal. Guaiac result negative.  Musculoskeletal: Normal range of motion.     Right foot: Normal range of motion.     Left foot: Normal range of motion.  Feet:     Right foot:     Protective Sensation: 5 sites tested. 5 sites sensed.     Skin integrity: Skin integrity normal.     Toenail Condition: Right toenails are normal.     Left foot:     Protective Sensation: 5 sites tested. 5 sites sensed.     Skin integrity: Skin integrity normal.     Toenail Condition: Left toenails are normal.  Skin:    General: Skin is warm and dry.  Neurological:     General: No focal deficit present.     Mental Status: She is alert and oriented to person, place, and time.  Psychiatric:        Mood and Affect: Mood normal.        Behavior: Behavior normal.         Assessment And Plan:     1. Routine general medical examination at health care facility  A full exam was performed.  Importance of monthly self breast exams was performed. PATIENT HAS BEEN ADVISED TO GET 30-45 MINUTES REGULAR EXERCISE NO LESS THAN FOUR TO FIVE DAYS PER WEEK - BOTH WEIGHTBEARING EXERCISES AND AEROBIC ARE RECOMMENDED.  SHE IS ADVISED TO FOLLOW A HEALTHY DIET WITH AT LEAST SIX  FRUITS/VEGGIES PER DAY, DECREASE INTAKE OF RED MEAT, AND TO INCREASE FISH INTAKE TO TWO DAYS PER WEEK.  MEATS/FISH SHOULD NOT BE FRIED, BAKED OR BROILED IS PREFERABLE.  I SUGGEST WEARING SPF 50 SUNSCREEN ON EXPOSED PARTS AND ESPECIALLY WHEN IN THE DIRECT SUNLIGHT FOR AN EXTENDED PERIOD OF TIME.  PLEASE AVOID FAST FOOD RESTAURANTS AND INCREASE YOUR WATER INTAKE.  - CMP14+EGFR - CBC - Lipid panel - Hemoglobin A1c - POC Hemoccult Bld/Stl (1-Cd Office Dx)  2. Encntr for gyn exam (general) (routine) w/o abn findings  Pap smear performed. Hemoccult is negative.    - Cytology -Pap Smear  3. Type 2 diabetes mellitus with stage 2 chronic kidney disease, with long-term current use of insulin ([place])  Diabetic foot exam was performed. Advised to decrease Novolog to 12 units before each meal, this should decrease risk of hypoglycemia.  I will also send her rx [id number] to update her immunizations.  DISCUSSED WITH THE PATIENT AT LENGTH REGARDING THE GOALS OF GLYCEMIC CONTROL AND POSSIBLE LONG-TERM COMPLICATIONS.  I  ALSO STRESSED THE IMPORTANCE OF COMPLIANCE WITH HOME GLUCOSE MONITORING, DIETARY RESTRICTIONS INCLUDING AVOIDANCE OF SUGARY DRINKS/PROCESSED FOODS,  ALONG WITH REGULAR EXERCISE.  I  ALSO STRESSED THE IMPORTANCE OF ANNUAL EYE EXAMS, SELF FOOT CARE AND COMPLIANCE WITH OFFICE VISITS.  4. Benign hypertensive renal disease  Well controlled. She will continue with current meds. She is encouraged to avoid adding salt to her foods. EKG performed, NSR w/o acute changes. She will rto in six months for re-evaluation.   - EKG 12-Lead   5. Fall, initial encounter  She reports her dog dragged her and she fell to the ground. This happened about a month ago. She did not suffer any injuries.    [name], MD    THE PATIENT IS ENCOURAGED TO PRACTICE SOCIAL DISTANCING DUE TO THE COVID-19 PANDEMIC.

## 2019-05-10 LAB — LIPID PANEL
Chol/HDL Ratio: 4.9 ratio — ABNORMAL HIGH (ref 0.0–4.4)
Cholesterol, Total: 207 mg/dL — ABNORMAL HIGH (ref 100–199)
HDL: 42 mg/dL (ref >39)
LDL Calculated: 142 mg/dL — ABNORMAL HIGH (ref 0–99)
Triglycerides: 116 mg/dL (ref 0–149)
VLDL Cholesterol Cal: 23 mg/dL (ref 5–40)

## 2019-05-10 LAB — CMP14+EGFR
ALT: 15 IU/L (ref 0–32)
AST: 15 IU/L (ref 0–40)
Albumin/Globulin Ratio: 1.2 (ref 1.2–2.2)
Albumin: 3.8 g/dL (ref 3.8–4.9)
Alkaline Phosphatase: 83 IU/L (ref 39–117)
BUN/Creatinine Ratio: 15 (ref 9–23)
BUN: 15 mg/dL (ref 6–24)
Bilirubin Total: 0.2 mg/dL (ref 0.0–1.2)
CO2: 21 mmol/L (ref 20–29)
Calcium: 9.5 mg/dL (ref 8.7–10.2)
Chloride: 102 mmol/L (ref 96–106)
Creatinine, Ser: 1 mg/dL (ref 0.57–1.00)
GFR calc Af Amer: 72 mL/min/{1.73_m2} (ref >59)
GFR calc non Af Amer: 63 mL/min/{1.73_m2} (ref >59)
Globulin, Total: 3.2 g/dL (ref 1.5–4.5)
Glucose: 157 mg/dL — ABNORMAL HIGH (ref 65–99)
Potassium: 5 mmol/L (ref 3.5–5.2)
Sodium: 138 mmol/L (ref 134–144)
Total Protein: 7 g/dL (ref 6.0–8.5)

## 2019-05-10 LAB — CBC
Hematocrit: 36.7 % (ref 34.0–46.6)
Hemoglobin: 11.5 g/dL (ref 11.1–15.9)
MCH: 24.6 pg — ABNORMAL LOW (ref 26.6–33.0)
MCHC: 31.3 g/dL — ABNORMAL LOW (ref 31.5–35.7)
MCV: 78 fL — ABNORMAL LOW (ref 79–97)
Platelets: 546 10*3/uL — ABNORMAL HIGH (ref 150–450)
RBC: 4.68 x10E6/uL (ref 3.77–5.28)
RDW: 17.2 % — ABNORMAL HIGH (ref 11.7–15.4)
WBC: 9 10*3/uL (ref 3.4–10.8)

## 2019-05-10 LAB — HEMOGLOBIN A1C
Est. average glucose Bld gHb Est-mCnc: 183 mg/dL
Hgb A1c MFr Bld: 8 % — ABNORMAL HIGH (ref 4.8–5.6)

## 2019-05-12 LAB — CYTOLOGY - PAP
Diagnosis: NEGATIVE
HPV: NOT DETECTED

## 2019-05-16 ENCOUNTER — Other Ambulatory Visit: Payer: Self-pay

## 2019-05-16 MED ORDER — LISINOPRIL 10 MG PO TABS: 10.0000 mg | ORAL_TABLET | Freq: Every day | ORAL | 1 refills | Status: DC

## 2019-05-16 MED ORDER — JARDIANCE 10 MG PO TABS: 10.0000 mg | ORAL_TABLET | Freq: Every morning | ORAL | 1 refills | Status: DC

## 2019-05-26 LAB — HM DIABETES EYE EXAM

## 2019-06-16 ENCOUNTER — Other Ambulatory Visit: Payer: Self-pay | Admitting: Internal Medicine

## 2019-06-17 ENCOUNTER — Encounter: Payer: Self-pay | Admitting: Internal Medicine

## 2019-08-09 ENCOUNTER — Ambulatory Visit: Payer: BC Managed Care – PPO | Admitting: Internal Medicine

## 2019-08-14 ENCOUNTER — Other Ambulatory Visit: Payer: Self-pay | Admitting: Internal Medicine

## 2019-08-30 ENCOUNTER — Encounter: Payer: Self-pay | Admitting: Internal Medicine

## 2019-08-30 ENCOUNTER — Ambulatory Visit: Payer: BC Managed Care – PPO | Admitting: Internal Medicine

## 2019-08-30 ENCOUNTER — Other Ambulatory Visit: Payer: Self-pay

## 2019-08-30 VITALS — BP 128/72 | HR 81 | Temp 97.7°F | Ht 62.2 in | Wt 182.0 lb

## 2019-08-30 DIAGNOSIS — Z23 Encounter for immunization: Secondary | ICD-10-CM

## 2019-08-30 DIAGNOSIS — N182 Chronic kidney disease, stage 2 (mild): Secondary | ICD-10-CM

## 2019-08-30 DIAGNOSIS — I129 Hypertensive chronic kidney disease with stage 1 through stage 4 chronic kidney disease, or unspecified chronic kidney disease: Secondary | ICD-10-CM | POA: Diagnosis not present

## 2019-08-30 DIAGNOSIS — M25562 Pain in left knee: Secondary | ICD-10-CM | POA: Diagnosis not present

## 2019-08-30 DIAGNOSIS — E1122 Type 2 diabetes mellitus with diabetic chronic kidney disease: Secondary | ICD-10-CM

## 2019-08-30 DIAGNOSIS — E6609 Other obesity due to excess calories: Secondary | ICD-10-CM

## 2019-08-30 DIAGNOSIS — Z794 Long term (current) use of insulin: Secondary | ICD-10-CM

## 2019-08-30 DIAGNOSIS — Z6833 Body mass index (BMI) 33.0-33.9, adult: Secondary | ICD-10-CM

## 2019-08-30 LAB — BMP8+EGFR
BUN/Creatinine Ratio: 21 (ref 9–23)
BUN: 21 mg/dL (ref 6–24)
CO2: 21 mmol/L (ref 20–29)
Calcium: 9.1 mg/dL (ref 8.7–10.2)
Chloride: 107 mmol/L — ABNORMAL HIGH (ref 96–106)
Creatinine, Ser: 1 mg/dL (ref 0.57–1.00)
GFR calc Af Amer: 72 mL/min/{1.73_m2} (ref >59)
GFR calc non Af Amer: 62 mL/min/{1.73_m2} (ref >59)
Glucose: 76 mg/dL (ref 65–99)
Potassium: 4.7 mmol/L (ref 3.5–5.2)
Sodium: 141 mmol/L (ref 134–144)

## 2019-08-30 LAB — HEMOGLOBIN A1C
Est. average glucose Bld gHb Est-mCnc: 154 mg/dL
Hgb A1c MFr Bld: 7 % — ABNORMAL HIGH (ref 4.8–5.6)

## 2019-08-30 NOTE — Patient Instructions (Signed)

## 2019-08-30 NOTE — Progress Notes (Signed)
Subjective:     Patient ID: Meagan [name] , female    DOB: [birth year] , 58 y.o.   MRN: [id number]   Chief Complaint  Patient presents with  . Diabetes  . Hypertension    HPI  Diabetes She presents for her follow-up diabetic visit. She has type 2 diabetes mellitus. Her disease course has been improving. There are no hypoglycemic associated symptoms. Pertinent negatives for diabetes include no blurred vision, no chest pain, no fatigue and no visual change. There are no hypoglycemic complications. Symptoms are improving. Diabetic complications include nephropathy. Risk factors for coronary artery disease include hypertension, obesity and diabetes mellitus. Current diabetic treatment includes insulin injections and oral agent (monotherapy). She is compliant with treatment most of the time. Her breakfast blood glucose is taken between 9-10 am. Her breakfast blood glucose range is generally 110-130 mg/dl.  Hypertension This is a chronic problem. The current episode started more than 1 year ago. Pertinent negatives include no blurred vision or chest pain.     Past Medical History:  Diagnosis Date  . Diabetes mellitus ([place])   . GERD (gastroesophageal reflux disease)   . Hyperlipidemia   . Hypertension      Family History  Problem Relation Age of Onset  . Kidney failure Mother   . Liver disease Mother   . Early death Father   . Hypertension Brother   . Colon cancer Neg Hx   . Stomach cancer Neg Hx      Current Outpatient Medications:  .  albuterol (PROAIR HFA) 108 (90 Base) MCG/ACT inhaler, Inhale 1-2 puffs into the lungs every 6 (six) hours as needed for wheezing or shortness of breath., Disp: 1 Inhaler, Rfl: 0 .  Cholecalciferol (DIALYVITE VITAMIN D 5000 PO), Take 2 capsules by mouth daily., Disp: , Rfl:  .  empagliflozin (JARDIANCE) 10 MG TABS tablet, Take 10 mg by mouth every morning., Disp: 90 tablet, Rfl: 1 .  fish oil-omega-3 fatty acids 1000 MG capsule, Take 2 g by mouth  daily., Disp: , Rfl:  .  ibuprofen (ADVIL,MOTRIN) 800 MG tablet, Take 1 tablet (800 mg total) by mouth 3 (three) times daily., Disp: 21 tablet, Rfl: 0 .  insulin aspart (NOVOLOG) 100 UNIT/ML injection, INJECT SUBCUTANEOUSLY PER SLIDING SCALE, Disp: 45 vial, Rfl: 3 .  Insulin Pen Needle (B-D ULTRAFINE III SHORT PEN) 31G X 8 MM MISC, Use as directed with insulin pen, Disp: 100 each, Rfl: 3 .  JANUMET 50-1000 MG tablet, TAKE 1 TABLET BY MOUTH TWICE A DAY, Disp: 180 tablet, Rfl: 1 .  lisinopril (ZESTRIL) 10 MG tablet, Take 1 tablet (10 mg total) by mouth daily., Disp: 90 tablet, Rfl: 1 .  meloxicam (MOBIC) 15 MG tablet, Take 15 mg by mouth daily. with food, Disp: , Rfl: 1 .  Multiple Vitamins-Minerals (MULTIVITAMIN WITH MINERALS) tablet, Take 1 tablet by mouth daily., Disp: , Rfl:  .  PRILOSEC OTC 20 MG tablet, Take 20 mg by mouth daily. , Disp: , Rfl:  .  sertraline (ZOLOFT) 50 MG tablet, TAKE 1 TABLET BY MOUTH EVERY DAY, Disp: 90 tablet, Rfl: 0 .  TRESIBA FLEXTOUCH 200 UNIT/ML SOPN, INJECT 66 UNITS SQ DAILY, Disp: 9 pen, Rfl: 3   Allergies  Allergen Reactions  . Phenothiazines     Bells palsy     Review of Systems  Constitutional: Negative.  Negative for fatigue.  Eyes: Negative for blurred vision.  Respiratory: Negative.   Cardiovascular: Negative.  Negative for chest pain.  Gastrointestinal: Negative.  Neurological: Negative.   Psychiatric/Behavioral: Negative.      Today's Vitals   08/30/19 0943  BP: 128/72  Pulse: 81  Temp: 97.7 F (36.5 C)  TempSrc: Oral  SpO2: 98%  Weight: 182 lb (82.6 kg)  Height: 5' 2.2" (1.58 m)   Body mass index is 33.07 kg/m.   Objective:  Physical Exam Vitals signs and nursing note reviewed.  Constitutional:      Appearance: Normal appearance.  HENT:     Head: Normocephalic and atraumatic.  Cardiovascular:     Rate and Rhythm: Normal rate and regular rhythm.     Heart sounds: Normal heart sounds.  Pulmonary:     Effort: Pulmonary effort  is normal.     Breath sounds: Normal breath sounds.  Skin:    General: Skin is warm.  Neurological:     General: No focal deficit present.     Mental Status: She is alert.  Psychiatric:        Mood and Affect: Mood normal.        Behavior: Behavior normal.         Assessment And Plan:     1. Type 2 diabetes mellitus with stage 2 chronic kidney disease, with long-term current use of insulin ([facility])  Chronic. She reports having good blood sugars. I will check labs as listed below. Unfortunately, she is unable to exercise due to knee pain.   - BMP8+EGFR - Hemoglobin A1c  2. Benign hypertensive renal disease  Chronic, well controlled. She will continue with current meds. She is encouraged to avoid adding salt to her foods.   3. Acute pain of left knee  She plans to schedule Ortho appt in the near future.  She plans to get steroid injection in the near future. She reports getting these every 4-6 months.  She is advised to get OTC Voltaren gel to apply to affected area two to three times daily.   4. Need for influenza vaccination  - Flu Vaccine QUAD 6+ mos PF IM (Fluarix Quad PF)  5. Class 1 obesity due to excess calories with serious comorbidity and body mass index (BMI) of 33.0 to 33.9 in adult  She was congratulated on her 10 pound weight loss and encouraged to keep up the great work. She is encouraged to strive for BMI less than 30 to decrease cardiac risk.   [name], MD    THE PATIENT IS ENCOURAGED TO PRACTICE SOCIAL DISTANCING DUE TO THE COVID-19 PANDEMIC.

## 2019-09-11 ENCOUNTER — Other Ambulatory Visit: Payer: Self-pay | Admitting: Internal Medicine

## 2019-09-25 ENCOUNTER — Other Ambulatory Visit: Payer: Self-pay | Admitting: Internal Medicine

## 2019-11-17 ENCOUNTER — Other Ambulatory Visit: Payer: Self-pay | Admitting: Internal Medicine

## 2019-11-18 ENCOUNTER — Ambulatory Visit (INDEPENDENT_AMBULATORY_CARE_PROVIDER_SITE_OTHER): Payer: BC Managed Care – PPO

## 2019-11-18 ENCOUNTER — Other Ambulatory Visit: Payer: Self-pay

## 2019-11-18 ENCOUNTER — Encounter (HOSPITAL_COMMUNITY): Payer: Self-pay | Admitting: Emergency Medicine

## 2019-11-18 ENCOUNTER — Ambulatory Visit (HOSPITAL_COMMUNITY)
Admission: EM | Admit: 2019-11-18 | Discharge: 2019-11-18 | Disposition: A | Discharge status: Home / Self Care | Payer: BC Managed Care – PPO | Attending: Physician Assistant | Admitting: Physician Assistant

## 2019-11-18 DIAGNOSIS — R2242 Localized swelling, mass and lump, left lower limb: Secondary | ICD-10-CM | POA: Diagnosis not present

## 2019-11-18 DIAGNOSIS — L03116 Cellulitis of left lower limb: Secondary | ICD-10-CM

## 2019-11-18 DIAGNOSIS — M79672 Pain in left foot: Secondary | ICD-10-CM | POA: Diagnosis not present

## 2019-11-18 MED ORDER — DOXYCYCLINE HYCLATE 100 MG PO CAPS: 100.0000 mg | ORAL_CAPSULE | Freq: Two times a day (BID) | ORAL | 0 refills | Status: AC

## 2019-11-18 MED ORDER — CEPHALEXIN 500 MG PO CAPS: 500.0000 mg | ORAL_CAPSULE | Freq: Four times a day (QID) | ORAL | 0 refills | Status: AC

## 2019-11-18 NOTE — ED Provider Notes (Signed)
Will document photograph for Epic.   Jaynee Eagles, PA-C 11/18/19 1701

## 2019-11-18 NOTE — Discharge Instructions (Signed)
Take both of the prescriptions I have prescribed as directed. We believe you have a cellulitis in your foot and it is important to take all of these medications.  I have prescribed TWO medications.  - Take Keflex 1 tablet FOUR times a day for 7 days - Take Doxycycline 1 tablet TWO times a day for 7 days.   Take 2 tylenol 325mg  tablets every 6 hours as needed for pain.  If your foot pain acutely worsens, swelling and redness increase or you cannot move your toe, please return.  If you have a high fever please go to the Emergency department.

## 2019-11-18 NOTE — ED Provider Notes (Signed)
[place]    CSN: [id number] Arrival date & time: 11/18/19  1555      History   Chief Complaint Chief Complaint  Patient presents with  . Wound Infection    left    HPI Meagan [name] is a 58 y.o. female.   Patient reports to urgent care today for Left foot pain and wound. She reports 2 months ago she had a crack between her 3rd and 4th toes on her Left foot. She reports it took a very long time to heal and believes it never fully healed. Today she notes recently worsening pain and swelling in her 4th toe and over the joints of her 4th toe. She also endorses that the pain is starting in her 3rd toe now as well. . She reports some pain with walking and pain at night. She believes there to be some discoloration as well. She denies discharge. She denies fever and chills. She denies trauma to the foot.  She has tried neosporyn on the foot and ibuprofen for pain.   She has a history of diabetes.      Past Medical History:  Diagnosis Date  . Diabetes mellitus ([place])   . GERD (gastroesophageal reflux disease)   . Hyperlipidemia   . Hypertension     Patient Active Problem List   Diagnosis Date Noted  . Type 2 diabetes mellitus with stage 2 chronic kidney disease, with long-term current use of insulin ([place]) 09/16/2018  . Chronic kidney disease (CKD), stage II (mild) 09/16/2018  . Benign hypertensive renal disease 09/16/2018    Past Surgical History:  Procedure Laterality Date  . DILATION AND CURETTAGE OF UTERUS  2007  . ROTATOR CUFF REPAIR Right 2018   Dr. Mardelle Matte  . TUBAL LIGATION  1992    OB History   No obstetric history on file.      Home Medications    Prior to Admission medications   Medication Sig Start Date End Date Taking? Authorizing Provider  Cholecalciferol (DIALYVITE VITAMIN D 5000 PO) Take 2 capsules by mouth daily.   Yes [provider]  fish oil-omega-3 fatty acids 1000 MG capsule Take 2 g by mouth daily.   Yes [provider]  insulin aspart (NOVOLOG) 100 UNIT/ML injection INJECT SUBCUTANEOUSLY PER SLIDING SCALE 01/26/19  Yes Glendale Chard, MD  Insulin Pen Needle (B-D ULTRAFINE III SHORT PEN) 31G X 8 MM MISC Use as directed with insulin pen 10/07/18  Yes Glendale Chard, MD  JANUMET 50-1000 MG tablet TAKE 1 TABLET BY MOUTH TWICE A DAY 08/15/19  Yes Glendale Chard, MD  JARDIANCE 10 MG TABS tablet TAKE 1 TABLET BY MOUTH EVERY MORNING 11/17/19  Yes Glendale Chard, MD  lisinopril (ZESTRIL) 10 MG tablet TAKE 1 TABLET BY MOUTH EVERY DAY 11/17/19  Yes Glendale Chard, MD  Multiple Vitamins-Minerals (MULTIVITAMIN WITH MINERALS) tablet Take 1 tablet by mouth daily.   Yes [provider]  PRILOSEC OTC 20 MG tablet Take 20 mg by mouth daily.  08/23/12  Yes [provider]  sertraline (ZOLOFT) 50 MG tablet TAKE 1 TABLET BY MOUTH EVERY DAY 09/12/19  Yes Glendale Chard, MD  TRESIBA FLEXTOUCH 200 UNIT/ML SOPN INJECT 66 UNITS SQ DAILY 09/26/19  Yes Glendale Chard, MD  albuterol [facility] HFA) 108 (90 Base) MCG/ACT inhaler Inhale 1-2 puffs into the lungs every 6 (six) hours as needed for wheezing or shortness of breath. 02/25/19   Augusto Gamble B, NP  cephALEXin (KEFLEX) 500 MG capsule Take 1 capsule (  500 mg total) by mouth 4 (four) times daily for 7 days. 11/18/19 11/25/19  Zhana Jeangilles, Marguerita Beards, PA-C  doxycycline (VIBRAMYCIN) 100 MG capsule Take 1 capsule (100 mg total) by mouth 2 (two) times daily for 7 days. 11/18/19 11/25/19  Clinten Howk, Marguerita Beards, PA-C  ibuprofen (ADVIL,MOTRIN) 800 MG tablet Take 1 tablet (800 mg total) by mouth 3 (three) times daily. 02/21/16   Sam, Olivia Canter, PA-C  meloxicam (MOBIC) 15 MG tablet Take 15 mg by mouth daily. with food 07/11/18   [provider]    Family History Family History  Problem Relation Age of Onset  . Kidney failure Mother   . Liver disease Mother   . Early death Father   . Hypertension Brother   . Colon cancer Neg Hx   . Stomach cancer Neg Hx     Social  History Social History   Tobacco Use  . Smoking status: Never Smoker  . Smokeless tobacco: Never Used  Substance Use Topics  . Alcohol use: Not Currently    Comment: occasional  . Drug use: Never     Allergies   Phenothiazines   Review of Systems Review of Systems  Constitutional: Negative for activity change, chills, fatigue and fever.  Respiratory: Negative for cough and shortness of breath.   Cardiovascular: Negative for chest pain.  Musculoskeletal: Positive for arthralgias and gait problem.  Skin: Positive for color change and wound.  Neurological: Positive for numbness.  Hematological: Does not bruise/bleed easily.     Physical Exam Triage Vital Signs ED Triage Vitals  Enc Vitals Group     BP 11/18/19 1627 (!) 149/65     Pulse Rate 11/18/19 1627 90     Resp 11/18/19 1627 18     Temp 11/18/19 1627 98.6 F (37 C)     Temp Source 11/18/19 1627 Oral     SpO2 11/18/19 1627 100 %     Weight --      Height --      Head Circumference --      Peak Flow --      Pain Score 11/18/19 1628 8     Pain Loc --      Pain Edu? --      Excl. in [place]? --    No data found.  Updated Vital Signs BP (!) 149/65 (BP Location: Right Arm)   Pulse 90   Temp 98.6 F (37 C) (Oral)   Resp 18   SpO2 100%   Visual Acuity Right Eye Distance:   Left Eye Distance:   Bilateral Distance:    Right Eye Near:   Left Eye Near:    Bilateral Near:     Physical Exam Constitutional:      General: She is not in acute distress.    Appearance: Normal appearance. She is normal weight.  HENT:     Head: Normocephalic and atraumatic.     Nose: Nose normal.  Cardiovascular:     Rate and Rhythm: Normal rate and regular rhythm.     Comments: Pedal pulses diminished bilaterally.  Pulmonary:     Effort: Pulmonary effort is normal.     Breath sounds: Normal breath sounds.  Musculoskeletal:     Right lower leg: No edema.     Left lower leg: No edema.     Comments: 2cm x 3cm area of  discoloration and edema with a central papular lesion proximal to the MTP of the 3-5th digits of Left foot. 4th digit has  edema and erythema present with Tenderness on ROM. 3rd digit tenderness with ROM.   Skin:    General: Skin is warm and dry.     Capillary Refill: Capillary refill takes 2 to 3 seconds.     Findings: Erythema: over left 4th MTP.  Neurological:     General: No focal deficit present.     Mental Status: She is alert and oriented to person, place, and time.  Psychiatric:        Mood and Affect: Mood normal.        Behavior: Behavior normal.        Thought Content: Thought content normal.        Judgment: Judgment normal.       UC Treatments / Results  Labs (all labs ordered are listed, but only abnormal results are displayed) Labs Reviewed - No data to display  EKG   Radiology DG Foot 2 Views Left  Result Date: 11/18/2019 CLINICAL DATA:  Foot swelling and tenderness. Concern for osteomyelitis. EXAM: LEFT FOOT - 2 VIEW COMPARISON:  None. FINDINGS: No acute fracture or dislocation is identified. There is severe first MTP joint space narrowing and marginal osteophytosis. No osseous erosion is identified. There is a moderately large plantar calcaneal enthesophyte, and there is mild-to-moderate dorsal spurring in the midfoot. No soft tissue emphysema or radiopaque foreign body is identified. IMPRESSION: 1. No radiographic evidence of osteomyelitis. 2. Advanced first MTP osteoarthrosis. Electronically Signed   By: Logan Bores M.D.   On: 11/18/2019 17:10    Procedures Procedures (including critical care time)  Medications Ordered in UC Medications - No data to display  Initial Impression / Assessment and Plan / UC Course  I have reviewed the triage vital signs and the nursing notes.  Pertinent labs & imaging results that were available during my care of the patient were reviewed by me and considered in my medical decision making (see chart for details).      Cellulitis of Left Foot - Xray without sign of osteo at this time. Considering Diabetic history and poor circulation will cover with both keflex and doxycycline. Recommended close follow up for recheck with PCP.   Final Clinical Impressions(s) / UC Diagnoses   Final diagnoses:  Cellulitis of left foot     Discharge Instructions     Take both of the prescriptions I have prescribed as directed. We believe you have a cellulitis in your foot and it is important to take all of these medications.  I have prescribed TWO medications.  - Take Keflex 1 tablet FOUR times a day for 7 days - Take Doxycycline 1 tablet TWO times a day for 7 days.   Take 2 tylenol 325mg  tablets every 6 hours as needed for pain.  If your foot pain acutely worsens, swelling and redness increase or you cannot move your toe, please return.  If you have a high fever please go to the Emergency department.      ED Prescriptions    Medication Sig Dispense Auth. Provider   doxycycline (VIBRAMYCIN) 100 MG capsule Take 1 capsule (100 mg total) by mouth 2 (two) times daily for 7 days. 14 capsule Paitlyn Mcclatchey, Marguerita Beards, PA-C   cephALEXin (KEFLEX) 500 MG capsule Take 1 capsule (500 mg total) by mouth 4 (four) times daily for 7 days. 28 capsule Enedina Pair, Marguerita Beards, PA-C     PDMP not reviewed this encounter.   Purnell Shoemaker, PA-C 11/18/19 1733

## 2019-11-18 NOTE — ED Triage Notes (Signed)
Pt has a hard spot on the top of her left foot between her pinky toe and the adjacent toe.  She states she thinks it started as cracked skin and it hasn't gotten better.  There is no apparent open wound, but the area is very tender to the touch and it is causing her some trouble with ambulation.

## 2019-11-28 ENCOUNTER — Telehealth: Payer: Self-pay

## 2019-11-28 NOTE — Telephone Encounter (Signed)
The pt was contacted an notified that she needed to scheduled for her annual mammogram.

## 2019-12-02 HISTORY — PX: CARPAL TUNNEL RELEASE: SHX101

## 2019-12-07 ENCOUNTER — Other Ambulatory Visit: Payer: Self-pay | Admitting: Internal Medicine

## 2019-12-14 ENCOUNTER — Other Ambulatory Visit: Payer: Self-pay | Admitting: Internal Medicine

## 2019-12-14 LAB — HM MAMMOGRAPHY

## 2019-12-21 ENCOUNTER — Encounter: Payer: Self-pay | Admitting: Internal Medicine

## 2019-12-26 ENCOUNTER — Other Ambulatory Visit: Payer: Self-pay

## 2019-12-26 MED ORDER — NOVOLOG FLEXPEN 100 UNIT/ML ~~LOC~~ SOPN: PEN_INJECTOR | SUBCUTANEOUS | 1 refills | Status: DC

## 2020-01-02 ENCOUNTER — Encounter: Payer: Self-pay | Admitting: Internal Medicine

## 2020-01-02 ENCOUNTER — Ambulatory Visit: Payer: BC Managed Care – PPO | Admitting: Internal Medicine

## 2020-01-02 ENCOUNTER — Other Ambulatory Visit: Payer: Self-pay

## 2020-01-02 VITALS — BP 124/66 | HR 89 | Temp 98.2°F | Ht 62.6 in | Wt 181.6 lb

## 2020-01-02 DIAGNOSIS — Z23 Encounter for immunization: Secondary | ICD-10-CM

## 2020-01-02 DIAGNOSIS — I129 Hypertensive chronic kidney disease with stage 1 through stage 4 chronic kidney disease, or unspecified chronic kidney disease: Secondary | ICD-10-CM

## 2020-01-02 DIAGNOSIS — E1122 Type 2 diabetes mellitus with diabetic chronic kidney disease: Secondary | ICD-10-CM | POA: Diagnosis not present

## 2020-01-02 DIAGNOSIS — E7800 Pure hypercholesterolemia, unspecified: Secondary | ICD-10-CM

## 2020-01-02 DIAGNOSIS — N182 Chronic kidney disease, stage 2 (mild): Secondary | ICD-10-CM | POA: Diagnosis not present

## 2020-01-02 DIAGNOSIS — M79672 Pain in left foot: Secondary | ICD-10-CM

## 2020-01-02 DIAGNOSIS — E6609 Other obesity due to excess calories: Secondary | ICD-10-CM

## 2020-01-02 DIAGNOSIS — Z6832 Body mass index (BMI) 32.0-32.9, adult: Secondary | ICD-10-CM

## 2020-01-02 DIAGNOSIS — Z794 Long term (current) use of insulin: Secondary | ICD-10-CM

## 2020-01-02 NOTE — Progress Notes (Signed)
This visit occurred during the SARS-CoV-2 public health emergency.  Safety protocols were in place, including screening questions prior to the visit, additional usage of staff PPE, and extensive cleaning of exam room while observing appropriate contact time as indicated for disinfecting solutions.  Subjective:     Patient ID: Meagan [name] , female    DOB: 1961-03-16 , 59 y.o.   MRN: [id number]   Chief Complaint  Patient presents with  . Diabetes  . Hypertension    HPI  She is here today for dm/bp check. She admits that she is not exercising on a regular basis.   Diabetes She presents for her follow-up diabetic visit. She has type 2 diabetes mellitus. Her disease course has been improving. There are no hypoglycemic associated symptoms. Pertinent negatives for diabetes include no blurred vision, no chest pain, no fatigue and no visual change. There are no hypoglycemic complications. Symptoms are improving. Diabetic complications include nephropathy. Risk factors for coronary artery disease include hypertension, obesity and diabetes mellitus. Current diabetic treatment includes insulin injections and oral agent (monotherapy). She is compliant with treatment most of the time. She has not had a previous visit with a dietitian. She participates in exercise intermittently. Her breakfast blood glucose is taken between 9-10 am. Her breakfast blood glucose range is generally 110-130 mg/dl. She does not see a podiatrist.Eye exam is current.  Hypertension This is a chronic problem. The current episode started more than 1 year ago. Pertinent negatives include no blurred vision or chest pain. Risk factors for coronary artery disease include diabetes mellitus, dyslipidemia, obesity, post-menopausal state and sedentary lifestyle. Past treatments include ACE inhibitors. Compliance problems include exercise.      Past Medical History:  Diagnosis Date  . Diabetes mellitus ([place])   . GERD (gastroesophageal  reflux disease)   . Hyperlipidemia   . Hypertension      Family History  Problem Relation Age of Onset  . Kidney failure Mother   . Liver disease Mother   . Early death Father   . Hypertension Brother   . Colon cancer Neg Hx   . Stomach cancer Neg Hx      Current Outpatient Medications:  .  albuterol (PROAIR HFA) 108 (90 Base) MCG/ACT inhaler, Inhale 1-2 puffs into the lungs every 6 (six) hours as needed for wheezing or shortness of breath., Disp: 1 Inhaler, Rfl: 0 .  Cholecalciferol (DIALYVITE VITAMIN D 5000 PO), Take 2 capsules by mouth daily., Disp: , Rfl:  .  fish oil-omega-3 fatty acids 1000 MG capsule, Take 2 g by mouth daily., Disp: , Rfl:  .  ibuprofen (ADVIL,MOTRIN) 800 MG tablet, Take 1 tablet (800 mg total) by mouth 3 (three) times daily., Disp: 21 tablet, Rfl: 0 .  insulin aspart (NOVOLOG FLEXPEN) 100 UNIT/ML FlexPen, Inject 12 units 3 times per day with meals not to exceed 50 units, Disp: 15 pen, Rfl: 1 .  Insulin Pen Needle (B-D ULTRAFINE III SHORT PEN) 31G X 8 MM MISC, Use as directed with insulin pen, Disp: 100 each, Rfl: 3 .  JARDIANCE 10 MG TABS tablet, TAKE 1 TABLET BY MOUTH EVERY MORNING, Disp: 90 tablet, Rfl: 1 .  lisinopril (ZESTRIL) 10 MG tablet, TAKE 1 TABLET BY MOUTH EVERY DAY, Disp: 90 tablet, Rfl: 1 .  Multiple Vitamins-Minerals (MULTIVITAMIN WITH MINERALS) tablet, Take 1 tablet by mouth daily., Disp: , Rfl:  .  PRILOSEC OTC 20 MG tablet, Take 20 mg by mouth daily. , Disp: , Rfl:  .  sertraline (ZOLOFT)  50 MG tablet, TAKE 1 TABLET BY MOUTH EVERY DAY, Disp: 90 tablet, Rfl: 0 .  TRESIBA FLEXTOUCH 200 UNIT/ML SOPN, INJECT 66 UNITS SQ DAILY, Disp: 9 pen, Rfl: 1   Allergies  Allergen Reactions  . Phenothiazines     Bells palsy     Review of Systems  Constitutional: Negative.  Negative for fatigue.  Eyes: Negative for blurred vision.  Respiratory: Negative.   Cardiovascular: Negative.  Negative for chest pain.  Gastrointestinal: Negative.    Musculoskeletal: Positive for arthralgias.       She c/o left foot pain.  She denies fall/trauma. Started to hurt about 2 months ago. There is pain with ambulation.   Neurological: Negative.   Psychiatric/Behavioral: Negative.      Today's Vitals   01/02/20 0943  BP: 124/66  Pulse: 89  Temp: 98.2 F (36.8 C)  TempSrc: Oral  Weight: 181 lb 9.6 oz (82.4 kg)  Height: 5' 2.6" (1.59 m)   Body mass index is 32.58 kg/m.   Objective:  Physical Exam Vitals and nursing note reviewed.  Constitutional:      Appearance: Normal appearance.  HENT:     Head: Normocephalic and atraumatic.  Cardiovascular:     Rate and Rhythm: Normal rate and regular rhythm.     Pulses:          Dorsalis pedis pulses are 2+ on the right side.     Heart sounds: Normal heart sounds.  Pulmonary:     Effort: Pulmonary effort is normal.     Breath sounds: Normal breath sounds.  Feet:     Right foot:     Skin integrity: Callus present.  Skin:    General: Skin is warm.  Neurological:     General: No focal deficit present.     Mental Status: She is alert.  Psychiatric:        Mood and Affect: Mood normal.        Behavior: Behavior normal.         Assessment And Plan:     1. Type 2 diabetes mellitus with stage 2 chronic kidney disease, with long-term current use of insulin ([facility])  Chronic. Last a1c results reviewed. I will check labs as listed below. Importance of increasing her daily exercise was discussed with the patient. I will make further recommendations once her labs are available for relief.   - CMP14+EGFR - Hemoglobin A1c - Lipid panel  2. Benign hypertensive renal disease  Chronic, well controlled. She will continue with current meds. She is encouraged to avoid adding salt to her foods.   3. Pure hypercholesterolemia  Chronic. She is encouraged to take meds daily. I will check fasting lipid panel today.   4. Class 1 obesity due to excess calories with serious comorbidity and body  mass index (BMI) of 32.0 to 32.9 in adult  She is encouraged to strive for BMI less than 28 to decrease cardiac risk. She is encouraged to aim for at least 150 minutes of exercise per week.   5. Immunization due  She does not wish to get pneumovax today. She will wait until her next visit.   6. Left foot pain  Pt advised her pain may be related to having a flat foot. She may benefit from orthotics. She agrees to [employer] evaluation/referral.   - Ambulatory referral to Podiatry     [name], MD    THE PATIENT IS ENCOURAGED TO PRACTICE SOCIAL DISTANCING DUE TO THE COVID-19 PANDEMIC.

## 2020-01-02 NOTE — Patient Instructions (Signed)

## 2020-01-03 LAB — CMP14+EGFR
ALT: 8 IU/L (ref 0–32)
AST: 13 IU/L (ref 0–40)
Albumin/Globulin Ratio: 1.1 — ABNORMAL LOW (ref 1.2–2.2)
Albumin: 3.4 g/dL — ABNORMAL LOW (ref 3.8–4.9)
Alkaline Phosphatase: 77 IU/L (ref 39–117)
BUN/Creatinine Ratio: 14 (ref 9–23)
BUN: 13 mg/dL (ref 6–24)
Bilirubin Total: <0.2 mg/dL (ref 0.0–1.2)
CO2: 23 mmol/L (ref 20–29)
Calcium: 9 mg/dL (ref 8.7–10.2)
Chloride: 108 mmol/L — ABNORMAL HIGH (ref 96–106)
Creatinine, Ser: 0.9 mg/dL (ref 0.57–1.00)
GFR calc Af Amer: 82 mL/min/{1.73_m2} (ref >59)
GFR calc non Af Amer: 71 mL/min/{1.73_m2} (ref >59)
Globulin, Total: 3.1 g/dL (ref 1.5–4.5)
Glucose: 73 mg/dL (ref 65–99)
Potassium: 4.6 mmol/L (ref 3.5–5.2)
Sodium: 144 mmol/L (ref 134–144)
Total Protein: 6.5 g/dL (ref 6.0–8.5)

## 2020-01-03 LAB — LIPID PANEL
Chol/HDL Ratio: 4.1 ratio (ref 0.0–4.4)
Cholesterol, Total: 195 mg/dL (ref 100–199)
HDL: 47 mg/dL (ref >39)
LDL Chol Calc (NIH): 130 mg/dL — ABNORMAL HIGH (ref 0–99)
Triglycerides: 102 mg/dL (ref 0–149)
VLDL Cholesterol Cal: 18 mg/dL (ref 5–40)

## 2020-01-03 LAB — HEMOGLOBIN A1C
Est. average glucose Bld gHb Est-mCnc: 163 mg/dL
Hgb A1c MFr Bld: 7.3 % — ABNORMAL HIGH (ref 4.8–5.6)

## 2020-01-05 ENCOUNTER — Emergency Department (HOSPITAL_COMMUNITY)
Admission: EM | Admit: 2020-01-05 | Discharge: 2020-01-05 | Disposition: A | Discharge status: Home / Self Care | Payer: BC Managed Care – PPO | Attending: Emergency Medicine | Admitting: Emergency Medicine

## 2020-01-05 ENCOUNTER — Other Ambulatory Visit: Payer: Self-pay

## 2020-01-05 ENCOUNTER — Encounter (HOSPITAL_COMMUNITY): Payer: Self-pay | Admitting: Emergency Medicine

## 2020-01-05 DIAGNOSIS — Z79899 Other long term (current) drug therapy: Secondary | ICD-10-CM | POA: Insufficient documentation

## 2020-01-05 DIAGNOSIS — Y9389 Activity, other specified: Secondary | ICD-10-CM | POA: Diagnosis not present

## 2020-01-05 DIAGNOSIS — E1122 Type 2 diabetes mellitus with diabetic chronic kidney disease: Secondary | ICD-10-CM | POA: Insufficient documentation

## 2020-01-05 DIAGNOSIS — M25511 Pain in right shoulder: Secondary | ICD-10-CM | POA: Insufficient documentation

## 2020-01-05 DIAGNOSIS — Z794 Long term (current) use of insulin: Secondary | ICD-10-CM | POA: Diagnosis not present

## 2020-01-05 DIAGNOSIS — S3992XA Unspecified injury of lower back, initial encounter: Secondary | ICD-10-CM | POA: Diagnosis present

## 2020-01-05 DIAGNOSIS — Y92410 Unspecified street and highway as the place of occurrence of the external cause: Secondary | ICD-10-CM | POA: Diagnosis not present

## 2020-01-05 DIAGNOSIS — I129 Hypertensive chronic kidney disease with stage 1 through stage 4 chronic kidney disease, or unspecified chronic kidney disease: Secondary | ICD-10-CM | POA: Diagnosis not present

## 2020-01-05 DIAGNOSIS — S39012A Strain of muscle, fascia and tendon of lower back, initial encounter: Secondary | ICD-10-CM | POA: Diagnosis not present

## 2020-01-05 DIAGNOSIS — Y999 Unspecified external cause status: Secondary | ICD-10-CM | POA: Insufficient documentation

## 2020-01-05 DIAGNOSIS — R519 Headache, unspecified: Secondary | ICD-10-CM | POA: Insufficient documentation

## 2020-01-05 DIAGNOSIS — N182 Chronic kidney disease, stage 2 (mild): Secondary | ICD-10-CM | POA: Diagnosis not present

## 2020-01-05 MED ORDER — NAPROXEN 500 MG PO TABS: 500.0000 mg | ORAL_TABLET | Freq: Two times a day (BID) | ORAL | 0 refills | Status: DC | PRN

## 2020-01-05 MED ORDER — CYCLOBENZAPRINE HCL 10 MG PO TABS: 10.0000 mg | ORAL_TABLET | Freq: Two times a day (BID) | ORAL | 0 refills | Status: DC | PRN

## 2020-01-05 NOTE — Discharge Instructions (Addendum)
Please read the attachments provided.  Your discomfort will likely worsen tomorrow in the subsequent day.  Please take your medications, as prescribed.  You were given a prescription for Flexeril which is a muscle relaxer.  You should not drive, work, consume alcohol, or operate machinery while taking this medication as it can make you very drowsy.  Please discontinue naproxen and seek immediate medical attention should you develop dark black stools.  Please return to the ED or seek immediate medical attention should you develop any new or worsening symptoms.
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
## 2020-02-09 ENCOUNTER — Ambulatory Visit: Payer: BC Managed Care – PPO | Attending: Internal Medicine

## 2020-02-09 DIAGNOSIS — Z23 Encounter for immunization: Secondary | ICD-10-CM

## 2020-02-09 NOTE — Progress Notes (Signed)
   Covid-19 Vaccination Clinic  Name:  Meagan [name]    MRN: [id number] DOB: 1961-01-28  02/09/2020  [name] was observed post Covid-19 immunization for 15 minutes without incident. She was provided with Vaccine Information Sheet and instruction to access the V-Safe system.   [name] was instructed to call 911 with any severe reactions post vaccine: Marland Kitchen Difficulty breathing  . Swelling of face and throat  . A fast heartbeat  . A bad rash all over body  . Dizziness and weakness   Immunizations Administered    Name Date Dose VIS Date Route   Pfizer COVID-19 Vaccine 02/09/2020  1:34 PM 0.3 mL 11/11/2019 Intramuscular   Manufacturer: [place]   Lot: [id number]   [place]: [id number]

## 2020-02-23 ENCOUNTER — Other Ambulatory Visit: Payer: Self-pay | Admitting: Internal Medicine

## 2020-03-05 ENCOUNTER — Ambulatory Visit: Payer: BC Managed Care – PPO | Attending: Internal Medicine

## 2020-03-05 DIAGNOSIS — Z23 Encounter for immunization: Secondary | ICD-10-CM

## 2020-03-05 NOTE — Progress Notes (Signed)
   Covid-19 Vaccination Clinic  Name:  Meagan [name]    MRN: [id number] DOB: 10-09-1961  03/05/2020  [name] was observed post Covid-19 immunization for 15 minutes without incident. She was provided with Vaccine Information Sheet and instruction to access the V-Safe system.   [name] was instructed to call 911 with any severe reactions post vaccine: Marland Kitchen Difficulty breathing  . Swelling of face and throat  . A fast heartbeat  . A bad rash all over body  . Dizziness and weakness   Immunizations Administered    Name Date Dose VIS Date Route   Pfizer COVID-19 Vaccine 03/05/2020 12:55 PM 0.3 mL 11/11/2019 Intramuscular   Manufacturer: [place]   Lot: [id number]   [place]: [id number]

## 2020-03-06 ENCOUNTER — Other Ambulatory Visit: Payer: Self-pay | Admitting: Internal Medicine

## 2020-03-06 ENCOUNTER — Encounter: Payer: Self-pay | Admitting: Internal Medicine

## 2020-03-06 ENCOUNTER — Other Ambulatory Visit: Payer: Self-pay

## 2020-03-06 ENCOUNTER — Ambulatory Visit: Payer: BC Managed Care – PPO | Admitting: Internal Medicine

## 2020-03-06 VITALS — BP 122/68 | HR 96 | Temp 97.4°F | Ht 62.6 in | Wt 186.2 lb

## 2020-03-06 DIAGNOSIS — I129 Hypertensive chronic kidney disease with stage 1 through stage 4 chronic kidney disease, or unspecified chronic kidney disease: Secondary | ICD-10-CM | POA: Diagnosis not present

## 2020-03-06 DIAGNOSIS — R202 Paresthesia of skin: Secondary | ICD-10-CM

## 2020-03-06 DIAGNOSIS — Z794 Long term (current) use of insulin: Secondary | ICD-10-CM

## 2020-03-06 DIAGNOSIS — E1122 Type 2 diabetes mellitus with diabetic chronic kidney disease: Secondary | ICD-10-CM | POA: Diagnosis not present

## 2020-03-06 DIAGNOSIS — M79609 Pain in unspecified limb: Secondary | ICD-10-CM | POA: Diagnosis not present

## 2020-03-06 DIAGNOSIS — N182 Chronic kidney disease, stage 2 (mild): Secondary | ICD-10-CM

## 2020-03-06 MED ORDER — GABAPENTIN 100 MG PO CAPS: ORAL_CAPSULE | ORAL | 0 refills | Status: DC

## 2020-03-06 MED ORDER — TRESIBA FLEXTOUCH 200 UNIT/ML ~~LOC~~ SOPN: PEN_INJECTOR | SUBCUTANEOUS | 3 refills | Status: DC

## 2020-03-06 NOTE — Patient Instructions (Signed)
Paresthesia Paresthesia is a burning or prickling feeling. This feeling can happen in any part of the body. It often happens in the hands, arms, legs, or feet. Usually, it is not painful. In most cases, the feeling goes away in a short time and is not a sign of a serious problem. If you have paresthesia that lasts a long time, you may need to be seen by your doctor. Follow these instructions at home: Alcohol use   Do not drink alcohol if: ? Your doctor tells you not to drink. ? You are pregnant, may be pregnant, or are planning to become pregnant.  If you drink alcohol: ? Limit how much you use to:  0-1 drink a day for women.  0-2 drinks a day for men. ? Be aware of how much alcohol is in your drink. In the [place] one drink equals one 12 oz bottle of beer (355 mL), one 5 oz glass of wine (148 mL), or one 1 oz glass of hard liquor (44 mL). Nutrition   Eat a healthy diet. This includes: ? Eating foods that have a lot of fiber in them, such as fresh fruits and vegetables, whole grains, and beans. ? Limiting foods that have a lot of fat and processed sugars in them, such as fried or sweet foods. General instructions  Take over-the-counter and prescription medicines only as told by your doctor.  Do not use any products that have nicotine or tobacco in them, such as cigarettes and e-cigarettes. If you need help quitting, ask your doctor.  If you have diabetes, work with your doctor to make sure your blood sugar stays in a healthy range.  If your feet feel numb: ? Check for redness, warmth, and swelling every day. ? Wear padded socks and comfortable shoes. These help protect your feet.  Keep all follow-up visits as told by your doctor. This is important. Contact a doctor if:  You have paresthesia that gets worse or does not go away.  Your burning or prickling feeling gets worse when you walk.  You have pain or cramps.  You feel dizzy.  You have a rash. Get help right away if  you:  Feel weak.  Have trouble walking or moving.  Have problems speaking, understanding, or seeing.  Feel confused.  Cannot control when you pee (urinate) or poop (have a bowel movement).  Lose feeling (have numbness) after an injury.  Have new weakness in an arm or leg.  Pass out (faint). Summary  Paresthesia is a burning or prickling feeling. It often happens in the hands, arms, legs, or feet.  In most cases, the feeling goes away in a short time and is not a sign of a serious problem.  If you have paresthesia that lasts a long time, you may need to be seen by your doctor. This information is not intended to replace advice given to you by your health care provider. Make sure you discuss any questions you have with your health care provider. Document Revised: 12/13/2018 Document Reviewed: 11/26/2017 Elsevier Patient Education  2020 Elsevier Inc.  

## 2020-03-06 NOTE — Progress Notes (Signed)
This visit occurred during the SARS-CoV-2 public health emergency.  Safety protocols were in place, including screening questions prior to the visit, additional usage of staff PPE, and extensive cleaning of exam room while observing appropriate contact time as indicated for disinfecting solutions.  Subjective:     Patient ID: Meagan [name] , female    DOB: Apr 22, 1961 , 59 y.o.   MRN: [id number]   Chief Complaint  Patient presents with  . Numbness    both hands    HPI  She is here today for further evaluation of numbness of b/l UE. She reports her sx started several months ago. She has tingling that starts in fingers of left hand and radiate up her arm. Thumb is most affected. Also with left hand weakness.  She denies RUE weakness, but does have paresthesias. She reports she is no longer on computer for long periods of time.  She is not sure what may have triggered her sx.     Past Medical History:  Diagnosis Date  . Diabetes mellitus ([place])   . GERD (gastroesophageal reflux disease)   . Hyperlipidemia   . Hypertension      Family History  Problem Relation Age of Onset  . Kidney failure Mother   . Liver disease Mother   . Early death Father   . Hypertension Brother   . Colon cancer Neg Hx   . Stomach cancer Neg Hx      Current Outpatient Medications:  .  albuterol (PROAIR HFA) 108 (90 Base) MCG/ACT inhaler, Inhale 1-2 puffs into the lungs every 6 (six) hours as needed for wheezing or shortness of breath., Disp: 1 Inhaler, Rfl: 0 .  Cholecalciferol (DIALYVITE VITAMIN D 5000 PO), Take 2 capsules by mouth daily., Disp: , Rfl:  .  fish oil-omega-3 fatty acids 1000 MG capsule, Take 2 g by mouth daily., Disp: , Rfl:  .  insulin aspart (NOVOLOG FLEXPEN) 100 UNIT/ML FlexPen, Inject 12 units 3 times per day with meals not to exceed 50 units, Disp: 15 pen, Rfl: 1 .  insulin degludec (TRESIBA FLEXTOUCH) 200 UNIT/ML FlexTouch Pen, INJECT 66 UNITS SQ DAILY, Disp: 15 mL, Rfl: 3 .   Insulin Pen Needle (B-D ULTRAFINE III SHORT PEN) 31G X 8 MM MISC, Use as directed with insulin pen, Disp: 100 each, Rfl: 3 .  JARDIANCE 10 MG TABS tablet, TAKE 1 TABLET BY MOUTH EVERY MORNING, Disp: 90 tablet, Rfl: 1 .  lisinopril (ZESTRIL) 10 MG tablet, TAKE 1 TABLET BY MOUTH EVERY DAY, Disp: 90 tablet, Rfl: 1 .  Multiple Vitamins-Minerals (MULTIVITAMIN WITH MINERALS) tablet, Take 1 tablet by mouth daily., Disp: , Rfl:  .  PRILOSEC OTC 20 MG tablet, Take 20 mg by mouth daily. , Disp: , Rfl:  .  sertraline (ZOLOFT) 50 MG tablet, TAKE 1 TABLET BY MOUTH EVERY DAY, Disp: 90 tablet, Rfl: 0 .  sitaGLIPtin-metformin (JANUMET) 50-1000 MG tablet, Take 1 tablet by mouth 2 (two) times daily with a meal., Disp: , Rfl:  .  gabapentin (NEURONTIN) 100 MG capsule, One cap po qhs x 7 days, then 2 caps po qhs x 7 days, then 3 caps po qhs, Disp: 90 capsule, Rfl: 0   Allergies  Allergen Reactions  . Phenothiazines     Bells palsy     Review of Systems  Constitutional: Negative.   Respiratory: Negative.   Cardiovascular: Negative.   Gastrointestinal: Negative.   Neurological: Positive for numbness.  Psychiatric/Behavioral: Negative.      Today's Vitals  03/06/20 0917  BP: 122/68  Pulse: 96  Temp: (!) 97.4 F (36.3 C)  TempSrc: Oral  SpO2: 99%  Weight: 186 lb 3.2 oz (84.5 kg)  Height: 5' 2.6" (1.59 m)   Body mass index is 33.41 kg/m.   Objective:  Physical Exam Vitals and nursing note reviewed.  Constitutional:      Appearance: Normal appearance.  HENT:     Head: Normocephalic and atraumatic.  Cardiovascular:     Rate and Rhythm: Normal rate and regular rhythm.     Heart sounds: Normal heart sounds.  Pulmonary:     Effort: Pulmonary effort is normal.     Breath sounds: Normal breath sounds.  Musculoskeletal:     Comments: Positive Tinels sign on left; pos Phalen's sign  Skin:    General: Skin is warm.  Neurological:     General: No focal deficit present.     Mental Status: She is  alert.  Psychiatric:        Mood and Affect: Mood normal.        Behavior: Behavior normal.         Assessment And Plan:     1. Paresthesia and pain of left extremity  Pt advised sx are suggestive of CTS. She agrees to NCS. I will also check labs as listed below. She was given rx gabapentin 100mg  to take nightly x 7 days, 2 caps po qhs x 7 days, then 3 capsules nightly. She agrees to rto in four weeks for re-evaluation.   - Vitamin B12 - TSH - Nerve conduction test; Future  2. Paresthesia and pain of right extremity  Please see above.  - Vitamin B12 - TSH - Nerve conduction test; Future  3. Type 2 diabetes mellitus with stage 2 chronic kidney disease, with long-term current use of insulin ([facility])  Chronic. She was given refill of Tresiba. We also discussed blood sugars- readings 130s. Pt advised goal is 100-120.    4. Benign hypertensive renal disease  Chronic, well controlled. She will continue with current meds.   [name], MD    THE PATIENT IS ENCOURAGED TO PRACTICE SOCIAL DISTANCING DUE TO THE COVID-19 PANDEMIC.

## 2020-03-07 LAB — VITAMIN B12: Vitamin B-12: 503 pg/mL (ref 232–1245)

## 2020-03-07 LAB — TSH: TSH: 0.571 u[IU]/mL (ref 0.450–4.500)

## 2020-03-20 ENCOUNTER — Encounter: Payer: BC Managed Care – PPO | Admitting: Neurology

## 2020-04-03 ENCOUNTER — Encounter: Payer: BC Managed Care – PPO | Admitting: Neurology

## 2020-04-03 ENCOUNTER — Other Ambulatory Visit: Payer: Self-pay

## 2020-04-03 ENCOUNTER — Ambulatory Visit (INDEPENDENT_AMBULATORY_CARE_PROVIDER_SITE_OTHER): Payer: BC Managed Care – PPO | Admitting: Neurology

## 2020-04-03 DIAGNOSIS — G5603 Carpal tunnel syndrome, bilateral upper limbs: Secondary | ICD-10-CM | POA: Diagnosis not present

## 2020-04-03 DIAGNOSIS — R200 Anesthesia of skin: Secondary | ICD-10-CM | POA: Diagnosis not present

## 2020-04-03 DIAGNOSIS — Z0289 Encounter for other administrative examinations: Secondary | ICD-10-CM

## 2020-04-03 NOTE — Progress Notes (Signed)
Full Name: Meagan [name] Gender: Female MRN #: [id number] Date of Birth: 1961/04/14    Visit Date: 04/03/2020 09:46 Age: 59 Years Examining Physician: Arlice Colt, MD  Referring Physician: Glendale Chard, MD Height: 5 feet 3 inch   History: Meagan [name] is a 59 year old woman with left greater than right hand numbness and pain over the last few months.  Symptoms sometimes awaken her at night.  On exam, she had 4+/5 strength in the left APB muscle and 10 hours signs bilaterally.  Nerve conduction studies: Bilateral median motor responses had delayed distal latencies.  The left motor response was mildly reduced in amplitude.  Forearm conduction velocity was normal.  Bilateral ulnar motor responses had normal distal latencies, amplitudes and conduction velocities.  Ulnar F-wave latencies were normal.  Bilateral median sensory responses had delayed peak latencies and reduced amplitudes.  Bilateral ulnar sensory responses had normal peak latencies and amplitudes.  Electromyography: Needle EMG of the left deltoid, triceps, biceps, EDC, first dorsal interosseous and abductor pollicis brevis muscles was performed.  Selected muscles of the left arm and hand and the right hand.. Motor unit morphology and recruitment was normal.  There was no abnormal spontaneous activity.  Needle EMG of the right first dorsal interosseous and abductor pollicis brevis muscles was performed.  Motor unit morphology and recruitment was normal.  There was no abnormal spontaneous activity.  Impression: This NCV/EMG study shows the following: 1.   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 2.   No evidence of significant superimposed radiculopathy.  Meagan Guinta A. Felecia Shelling, MD, PhD, FAAN Certified in Neurology, Clinical Neurophysiology, Sleep Medicine, Pain Medicine and Neuroimaging Director, [facility] [address],  [place], [place] [address] [phone]      [facility]    Nerve / Sites Muscle Latency Ref. Amplitude Ref. Rel Amp Segments Distance Velocity Ref. Area    ms ms mV mV %  cm m/s m/s mVms  R Median - APB     Wrist APB 6.3 ?4.4 5.7 ?4.0 100 Wrist - APB 7   19.8     Upper arm APB 10.3  5.2  92.9 Upper arm - Wrist 20 49 ?49 18.9  L Median - APB     Wrist APB 6.6 ?4.4 2.3 ?4.0 100 Wrist - APB 7   6.7     Upper arm APB 10.3  4.6  196 Upper arm - Wrist 20 54 ?49 17.3     Ulnar Wrist APB 4.1  2.9  62.7 Ulnar Wrist - APB    8.0     Ulnar B. Elbow APB 7.6  0.4  12.8 Ulnar B. Elbow - APB    0.8     Ulnar A. Elbow APB 9.4  2.3  640 Ulnar A. Elbow - Ulnar B. Elbow    6.0  R Ulnar - ADM     Wrist ADM 3.0 ?3.3 10.3 ?6.0 100 Wrist - ADM 7   24.5     B.Elbow ADM 6.2  10.0  96.8 B.Elbow - Wrist 17 53 ?49 24.5     A.Elbow ADM 8.1  10.2  102 A.Elbow - B.Elbow 10 51 ?49 25.7  L Ulnar - ADM     Wrist ADM 2.8 ?3.3 10.0 ?6.0 100 Wrist - ADM 7   24.6     B.Elbow ADM 6.0  9.2  91.2 B.Elbow - Wrist 17 52 ?49 23.2  A.Elbow ADM 7.9  8.7  94.6 A.Elbow - B.Elbow 10 52 ?49 23.3             SNC    Nerve / Sites Rec. Site Peak Lat Ref.  Amp Ref. Segments Distance    ms ms V V  cm  R Median - Orthodromic (Dig II, Mid palm)     Dig II Wrist 4.8 ?3.4 4 ?10 Dig II - Wrist 13  L Median - Orthodromic (Dig II, Mid palm)     Dig II Wrist 4.5 ?3.4 3 ?10 Dig II - Wrist 13  R Ulnar - Orthodromic, (Dig V, Mid palm)     Dig V Wrist 2.9 ?3.1 6 ?5 Dig V - Wrist 11  L Ulnar - Orthodromic, (Dig V, Mid palm)     Dig V Wrist 2.8 ?3.1 6 ?5 Dig V - Wrist 70             F  Wave    Nerve F Lat Ref.   ms ms  R Ulnar - ADM 30.8 ?32.0  L Ulnar - ADM 30.2 ?32.0

## 2020-04-16 ENCOUNTER — Other Ambulatory Visit: Payer: Self-pay | Admitting: Internal Medicine

## 2020-04-16 DIAGNOSIS — G5603 Carpal tunnel syndrome, bilateral upper limbs: Secondary | ICD-10-CM

## 2020-05-06 ENCOUNTER — Other Ambulatory Visit: Payer: Self-pay | Admitting: Internal Medicine

## 2020-05-10 ENCOUNTER — Encounter: Payer: BC Managed Care – PPO | Admitting: Internal Medicine

## 2020-05-21 ENCOUNTER — Telehealth: Payer: Self-pay

## 2020-05-21 NOTE — Telephone Encounter (Signed)
Ft vm for pt to call back for questions from provider.  Is she checking her BS regularly? Does she have a meter where her numbers can be reviewed? 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

## 2020-05-23 ENCOUNTER — Encounter: Payer: Self-pay | Admitting: Internal Medicine

## 2020-05-28 LAB — HM DIABETES EYE EXAM

## 2020-06-11 ENCOUNTER — Encounter: Payer: Self-pay | Admitting: Nurse Practitioner

## 2020-06-12 ENCOUNTER — Encounter: Payer: Self-pay | Admitting: Nurse Practitioner

## 2020-06-12 ENCOUNTER — Other Ambulatory Visit: Payer: Self-pay

## 2020-06-12 ENCOUNTER — Encounter: Payer: Self-pay | Admitting: Internal Medicine

## 2020-06-12 ENCOUNTER — Ambulatory Visit: Payer: BC Managed Care – PPO | Admitting: Nurse Practitioner

## 2020-06-12 VITALS — BP 112/68 | HR 102 | Temp 98.1°F | Ht 62.6 in | Wt 201.6 lb

## 2020-06-12 DIAGNOSIS — E1122 Type 2 diabetes mellitus with diabetic chronic kidney disease: Secondary | ICD-10-CM

## 2020-06-12 DIAGNOSIS — Z794 Long term (current) use of insulin: Secondary | ICD-10-CM

## 2020-06-12 DIAGNOSIS — G5603 Carpal tunnel syndrome, bilateral upper limbs: Secondary | ICD-10-CM

## 2020-06-12 DIAGNOSIS — Z114 Encounter for screening for human immunodeficiency virus [HIV]: Secondary | ICD-10-CM

## 2020-06-12 DIAGNOSIS — Z23 Encounter for immunization: Secondary | ICD-10-CM | POA: Diagnosis not present

## 2020-06-12 DIAGNOSIS — Z0000 Encounter for general adult medical examination without abnormal findings: Secondary | ICD-10-CM

## 2020-06-12 DIAGNOSIS — N182 Chronic kidney disease, stage 2 (mild): Secondary | ICD-10-CM

## 2020-06-12 DIAGNOSIS — E7800 Pure hypercholesterolemia, unspecified: Secondary | ICD-10-CM

## 2020-06-12 DIAGNOSIS — N644 Mastodynia: Secondary | ICD-10-CM

## 2020-06-12 MED ORDER — ONETOUCH VERIO VI STRP: ORAL_STRIP | 12 refills | Status: DC

## 2020-06-12 NOTE — Progress Notes (Signed)
This visit occurred during the SARS-CoV-2 public health emergency.  Safety protocols were in place, including screening questions prior to the visit, additional usage of staff PPE, and extensive cleaning of exam room while observing appropriate contact time as indicated for disinfecting solutions.  Subjective:     Patient ID: Meagan Meagan [name] , female    DOB: October 16, 1961 , 59 y.o.   MRN: [id number]   Chief Complaint  Patient presents with  . Annual Exam  . Pre-op Exam    patient needs clearance to have carpal tunnel surgery    HPI  Here for HM  She is also having carpal tunnel surgery bilaterally by Dr. Karin [name].  She has not had any symptoms of anxiety and has not been on her sertraline in 2 months.  Diabetes She presents for her follow-up diabetic visit. She has type 2 diabetes mellitus. Her disease course has been stable. There are no hypoglycemic associated symptoms. There are no diabetic associated symptoms. There are no hypoglycemic complications. Symptoms are stable. There are no diabetic complications. Current diabetic treatment includes oral agent (monotherapy). She is compliant with treatment all of the time. (Blood sugar averaging 130-140 was running in 90's)    The patient states she has a  tubal ligation  No LMP recorded. Patient is postmenopausal.. Mammogram last done 12/14/2019.  Negative for: breast discharge, breast lump(s), breast pain and breast self exam.  Pertinent negatives include abnormal bleeding (hematology), anxiety, decreased libido, depression, difficulty falling sleep, dyspareunia, history of infertility, nocturia, sexual dysfunction, sleep disturbances, urinary incontinence, urinary urgency, vaginal discharge and vaginal itching. Diet regular.  She is currently working at a summer camp and has noticed she is eating out more. Gets home late so will pick up fast food.  The patient states her exercise level is minimal.  The patient's tobacco use is:  Social History    Tobacco Use  Smoking Status Never Smoker  Smokeless Tobacco Never Used   She has been exposed to passive smoke. The patient's alcohol use is:  Social History   Substance and Sexual Activity  Alcohol Use Not Currently   Comment: occasional   Additional information: Last pap 05/09/2019, next one scheduled for 05/08/2022.  Past Medical History:  Diagnosis Date  . Diabetes mellitus ([place])   . GERD (gastroesophageal reflux disease)   . Hyperlipidemia   . Hypertension      Family History  Problem Relation Age of Onset  . Kidney failure Mother   . Liver disease Mother   . Early death Father   . Hypertension Brother   . Colon cancer Neg Hx   . Stomach cancer Neg Hx      Current Outpatient Medications:  .  Cholecalciferol (DIALYVITE VITAMIN D 5000 PO), Take 2 capsules by mouth daily., Disp: , Rfl:  .  insulin aspart (NOVOLOG FLEXPEN) 100 UNIT/ML FlexPen, Inject 12 units 3 times per day with meals not to exceed 50 units, Disp: 15 pen, Rfl: 1 .  insulin degludec (TRESIBA FLEXTOUCH) 200 UNIT/ML FlexTouch Pen, INJECT 66 UNITS SQ DAILY, Disp: 15 mL, Rfl: 3 .  Insulin Pen Needle (B-D ULTRAFINE III SHORT PEN) 31G X 8 MM MISC, Use as directed with insulin pen, Disp: 100 each, Rfl: 3 .  JARDIANCE 10 MG TABS tablet, TAKE 1 TABLET BY MOUTH EVERY MORNING, Disp: 90 tablet, Rfl: 1 .  lisinopril (ZESTRIL) 10 MG tablet, TAKE 1 TABLET BY MOUTH EVERY DAY, Disp: 90 tablet, Rfl: 1 .  MAGNESIUM PO, Take by mouth. Take 4  tablets d, Disp: , Rfl:  .  Multiple Vitamins-Minerals (MULTIVITAMIN WITH MINERALS) tablet, Take 1 tablet by mouth daily., Disp: , Rfl:  .  PRILOSEC OTC 20 MG tablet, Take 20 mg by mouth daily. , Disp: , Rfl:  .  sitaGLIPtin-metformin (JANUMET) 50-1000 MG tablet, Take 1 tablet by mouth 2 (two) times daily with a meal., Disp: , Rfl:  .  albuterol (PROAIR HFA) 108 (90 Base) MCG/ACT inhaler, Inhale 1-2 puffs into the lungs every 6 (six) hours as needed for wheezing or shortness of breath.  (Patient not taking: Reported on 06/11/2020), Disp: 1 Inhaler, Rfl: 0 .  fish oil-omega-3 fatty acids 1000 MG capsule, Take 2 g by mouth daily. (Patient not taking: Reported on 06/11/2020), Disp: , Rfl:  .  gabapentin (NEURONTIN) 100 MG capsule, TAKE 1 CAPSULE AT BEDTIME FOR 7 DAYS,THEN 2 CAPS AT BEDTIME FOR 7 DAYS,THEN 3 CAPS AT BEDTIME (Patient not taking: Reported on 06/11/2020), Disp: 90 capsule, Rfl: 0 .  sertraline (ZOLOFT) 50 MG tablet, TAKE 1 TABLET BY MOUTH EVERY DAY (Patient not taking: Reported on 06/11/2020), Disp: 90 tablet, Rfl: 0   Allergies  Allergen Reactions  . Phenothiazines     Bells palsy     Review of Systems  Constitutional: Negative.   HENT: Negative.   Eyes: Negative.   Respiratory: Negative.   Cardiovascular: Negative.   Gastrointestinal: Negative.   Endocrine: Negative.   Genitourinary: Negative.        Breast pain to right side  Musculoskeletal:       Bilateral wrist pain - planning to have carpal tunnel surgery  Skin: Negative.   Allergic/Immunologic: Negative.   Neurological: Negative.   Hematological: Negative.   Psychiatric/Behavioral: Negative.      Today's Vitals   06/12/20 1538  BP: 112/68  Pulse: (!) 102  Temp: 98.1 F (36.7 C)  TempSrc: Oral  Weight: 201 lb 9.6 oz (91.4 kg)  Height: 5' 2.6" (1.59 m)  PainSc: 6   PainLoc: Hand   Body mass index is 36.17 kg/m.   Objective:  Physical Exam Vitals reviewed.  Constitutional:      General: She is not in acute distress.    Appearance: Normal appearance. She is well-developed. She is obese.  HENT:     Head: Normocephalic and atraumatic.     Right Ear: Hearing, tympanic membrane, ear canal and external ear normal. There is no impacted cerumen.     Left Ear: Hearing, tympanic membrane, ear canal and external ear normal. There is no impacted cerumen.     Nose:     Comments: Deferred - masked    Mouth/Throat:     Comments: Deferred - masked Eyes:     General: Lids are normal.      Extraocular Movements: Extraocular movements intact.     Conjunctiva/sclera: Conjunctivae normal.     Pupils: Pupils are equal, Meagan [name], and reactive to light.     Funduscopic exam:    Right eye: No papilledema.        Left eye: No papilledema.  Neck:     Thyroid: No thyroid mass.     Vascular: No carotid bruit.  Cardiovascular:     Rate and Rhythm: Normal rate and regular rhythm.     Pulses: Normal pulses.     Heart sounds: Normal heart sounds. No murmur heard.   Pulmonary:     Effort: Pulmonary effort is normal. No respiratory distress.     Breath sounds: Normal breath sounds.  Abdominal:  General: Abdomen is flat. Bowel sounds are normal. There is no distension.     Palpations: Abdomen is soft.     Tenderness: There is no abdominal tenderness.  Genitourinary:    Rectum: Guaiac result negative.  Musculoskeletal:        General: No swelling. Normal range of motion.     Cervical back: Full passive range of motion without pain, normal range of motion and neck supple.     Right lower leg: No edema.     Left lower leg: No edema.  Skin:    General: Skin is warm and dry.     Capillary Refill: Capillary refill takes less than 2 seconds.  Neurological:     General: No focal deficit present.     Mental Status: She is alert and oriented to person, place, and time.     Cranial Nerves: No cranial nerve deficit.     Sensory: No sensory deficit.  Psychiatric:        Mood and Affect: Mood normal.        Behavior: Behavior normal.        Thought Content: Thought content normal.        Judgment: Judgment normal.         Assessment And Plan:     1. Encounter for general adult medical examination w/o abnormal findings . Behavior modifications discussed and diet history reviewed.   . Pt will continue to exercise regularly and modify diet with low GI, plant based foods and decrease intake of processed foods.  . Recommend intake of daily multivitamin, Vitamin D, and calcium.   . Recommend mammogram (has had her screening however will order diagnostic due to pain) for preventive screenings, as well as recommend immunizations that include influenza, TDAP (up to date)  2. Encounter for immunization  Pneumonia 23 vaccine given in office  3. Encounter for screening for HIV  - HIV antibody (with reflex)  4. Type 2 diabetes mellitus with stage 2 chronic kidney disease, with long-term current use of insulin (HCC)  Chronic, poorly controlled  Continue with current medications pending lab results  Encouraged to limit intake of sugary foods and drinks  Encouraged to increase physical activity to 150 minutes per week  She is needing surgical clearance for her carpal tunnel bilateral hands  EKG done with Sinus tachycardia HR 107 - POCT Urinalysis Dipstick (81002) - POCT UA - Microalbumin - EKG 12-Lead - Lipid panel - CMP14+EGFR - CBC no Diff - Hemoglobin A1c - Pneumococcal polysaccharide vaccine 23-valent greater than or equal to 2yo subcutaneous/IM  5. Carpal tunnel syndrome, bilateral  Needs surgical clearance  Wearing brace to both wrist  6. Pure hypercholesterolemia Not currently on statins Will check lipid panel  7. Breast pain  Pain to right breast at 11 o'clock  Will send for diagnostic mammogram - MM Digital Diagnostic Unilat R; Future        Patient was given opportunity to ask questions. Patient verbalized understanding of the plan and was able to repeat key elements of the plan. All questions were answered to their satisfaction.  Minette Brine, FNP   I, Minette Brine, FNP, have reviewed all documentation for this visit. The documentation on 06/12/20 for the exam, diagnosis, procedures, and orders are all accurate and complete.  THE PATIENT IS ENCOURAGED TO PRACTICE SOCIAL DISTANCING DUE TO THE COVID-19 PANDEMIC.

## 2020-06-13 LAB — CMP14+EGFR
ALT: 15 IU/L (ref 0–32)
AST: 13 IU/L (ref 0–40)
Albumin/Globulin Ratio: 1.2 (ref 1.2–2.2)
Albumin: 3.9 g/dL (ref 3.8–4.9)
Alkaline Phosphatase: 101 IU/L (ref 48–121)
BUN/Creatinine Ratio: 19 (ref 9–23)
BUN: 25 mg/dL — ABNORMAL HIGH (ref 6–24)
Bilirubin Total: <0.2 mg/dL (ref 0.0–1.2)
CO2: 20 mmol/L (ref 20–29)
Calcium: 9.1 mg/dL (ref 8.7–10.2)
Chloride: 103 mmol/L (ref 96–106)
Creatinine, Ser: 1.33 mg/dL — ABNORMAL HIGH (ref 0.57–1.00)
GFR calc Af Amer: 51 mL/min/{1.73_m2} — ABNORMAL LOW (ref >59)
GFR calc non Af Amer: 44 mL/min/{1.73_m2} — ABNORMAL LOW (ref >59)
Globulin, Total: 3.3 g/dL (ref 1.5–4.5)
Glucose: 269 mg/dL — ABNORMAL HIGH (ref 65–99)
Potassium: 5 mmol/L (ref 3.5–5.2)
Sodium: 139 mmol/L (ref 134–144)
Total Protein: 7.2 g/dL (ref 6.0–8.5)

## 2020-06-13 LAB — LIPID PANEL
Chol/HDL Ratio: 5.7 ratio — ABNORMAL HIGH (ref 0.0–4.4)
Cholesterol, Total: 224 mg/dL — ABNORMAL HIGH (ref 100–199)
HDL: 39 mg/dL — ABNORMAL LOW (ref >39)
LDL Chol Calc (NIH): 136 mg/dL — ABNORMAL HIGH (ref 0–99)
Triglycerides: 271 mg/dL — ABNORMAL HIGH (ref 0–149)
VLDL Cholesterol Cal: 49 mg/dL — ABNORMAL HIGH (ref 5–40)

## 2020-06-13 LAB — CBC
Hematocrit: 35.9 % (ref 34.0–46.6)
Hemoglobin: 11.1 g/dL (ref 11.1–15.9)
MCH: 23.7 pg — ABNORMAL LOW (ref 26.6–33.0)
MCHC: 30.9 g/dL — ABNORMAL LOW (ref 31.5–35.7)
MCV: 77 fL — ABNORMAL LOW (ref 79–97)
Platelets: 438 10*3/uL (ref 150–450)
RBC: 4.68 x10E6/uL (ref 3.77–5.28)
RDW: 17 % — ABNORMAL HIGH (ref 11.7–15.4)
WBC: 7.5 10*3/uL (ref 3.4–10.8)

## 2020-06-13 LAB — HEMOGLOBIN A1C
Est. average glucose Bld gHb Est-mCnc: 166 mg/dL
Hgb A1c MFr Bld: 7.4 % — ABNORMAL HIGH (ref 4.8–5.6)

## 2020-06-13 LAB — HIV ANTIBODY (ROUTINE TESTING W REFLEX): HIV Screen 4th Generation wRfx: NONREACTIVE

## 2020-07-10 ENCOUNTER — Other Ambulatory Visit: Payer: Self-pay | Admitting: Nurse Practitioner

## 2020-07-10 DIAGNOSIS — N182 Chronic kidney disease, stage 2 (mild): Secondary | ICD-10-CM

## 2020-07-10 DIAGNOSIS — E1122 Type 2 diabetes mellitus with diabetic chronic kidney disease: Secondary | ICD-10-CM

## 2020-07-10 DIAGNOSIS — E782 Mixed hyperlipidemia: Secondary | ICD-10-CM

## 2020-07-10 MED ORDER — ATORVASTATIN CALCIUM 10 MG PO TABS: 10.0000 mg | ORAL_TABLET | Freq: Every day | ORAL | 11 refills | Status: DC

## 2020-07-17 ENCOUNTER — Telehealth: Payer: Self-pay

## 2020-07-17 ENCOUNTER — Other Ambulatory Visit: Payer: Self-pay | Admitting: Internal Medicine

## 2020-07-17 NOTE — Telephone Encounter (Signed)
I called the pt to verify what form she is checking on.  Is it [employer] or a surgical clearance form.

## 2020-07-18 ENCOUNTER — Telehealth: Payer: Self-pay

## 2020-07-18 NOTE — Telephone Encounter (Signed)
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  The pt said that she needed the forms emailed to [email or web address].  The forms was emailed to [employer] and to the pt.  A copy has also been kept for the pt's chat.

## 2020-09-11 NOTE — Progress Notes (Deleted)
New Patient Note  RE: Meagan [name] MRN: [id number] DOB: 1961-07-29 Date of Office Visit: 09/12/2020  Referring provider: Glendale Chard, MD Primary care provider: Glendale Chard, MD  Chief Complaint: No chief complaint on file.  History of Present Illness: I had the pleasure of seeing Meagan [name] for initial evaluation at the [facility] [place] on 09/11/2020. She is a 59 y.o. female, who is referred here by Glendale Chard, MD for the evaluation of allergic reaction.  ***  Assessment and Plan: Meagan [name] is a 59 y.o. female with: No problem-specific Assessment & Plan notes found for this encounter.  No follow-ups on file.  No orders of the defined types were placed in this encounter.  Lab Orders  No laboratory test(s) ordered today    Other allergy screening: Asthma: {Blank single:19197::"yes","no"} Rhino conjunctivitis: {Blank single:19197::"yes","no"} Food allergy: {Blank single:19197::"yes","no"} Medication allergy: {Blank single:19197::"yes","no"} Hymenoptera allergy: {Blank single:19197::"yes","no"} Urticaria: {Blank single:19197::"yes","no"} Eczema:{Blank single:19197::"yes","no"} History of recurrent infections suggestive of immunodeficency: {Blank single:19197::"yes","no"}  Diagnostics: Spirometry:  Tracings reviewed. Her effort: {Blank single:19197::"Good reproducible efforts.","It was hard to get consistent efforts and there is a question as to whether this reflects a maximal maneuver.","Poor effort, data can not be interpreted."} FVC: ***L FEV1: ***L, ***% predicted FEV1/FVC ratio: ***% Interpretation: {Blank single:19197::"Spirometry consistent with mild obstructive disease","Spirometry consistent with moderate obstructive disease","Spirometry consistent with severe obstructive disease","Spirometry consistent with possible restrictive disease","Spirometry consistent with mixed obstructive and restrictive disease","Spirometry  uninterpretable due to technique","Spirometry consistent with normal pattern","No overt abnormalities noted given today's efforts"}.  Please see scanned spirometry results for details.  Skin Testing: {Blank single:19197::"Select foods","Environmental allergy panel","Environmental allergy panel and select foods","Food allergy panel","None","Deferred due to recent antihistamines use"}. Positive test to: ***. Negative test to: ***.  Results discussed with patient/family.   Past Medical History: Patient Active Problem List   Diagnosis Date Noted  . Numbness 04/03/2020  . Bilateral carpal tunnel syndrome 04/03/2020  . Type 2 diabetes mellitus with stage 2 chronic kidney disease, with long-term current use of insulin ([place]) 09/16/2018  . Chronic kidney disease (CKD), stage II (mild) 09/16/2018  . 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 Diabetes mellitus ([place])   . GERD (gastroesophageal reflux disease)   . Hyperlipidemia   . Hypertension    Past Surgical History: Past Surgical History:  Procedure Laterality Date  . DILATION AND CURETTAGE OF UTERUS  2007  . ROTATOR CUFF REPAIR Right 2018   Dr. Mardelle Matte  . TUBAL LIGATION  1992   Medication List:  Current Outpatient Medications  Medication Sig Dispense Refill  . albuterol (PROAIR HFA) 108 (90 Base) MCG/ACT inhaler Inhale 1-2 puffs into the lungs every 6 (six) hours as needed for wheezing or shortness of breath. (Patient not taking: Reported on 06/11/2020) 1 Inhaler 0  . atorvastatin (LIPITOR) 10 MG tablet Take 1 tablet (10 mg total) by mouth daily. 30 tablet 11  . Cholecalciferol (DIALYVITE VITAMIN D 5000 PO) Take 2 capsules by mouth daily.    . fish oil-omega-3 fatty acids 1000 MG capsule Take 2 g by mouth daily. (Patient not taking: Reported on 06/11/2020)    . gabapentin (NEURONTIN) 100 MG capsule TAKE 1 CAPSULE AT BEDTIME FOR 7 DAYS,THEN 2 CAPS AT BEDTIME FOR 7 DAYS,THEN 3 CAPS AT BEDTIME  (Patient not taking: Reported on 06/11/2020) 90 capsule 0  . glucose blood (ONETOUCH VERIO) test strip USE TO CHECK BLOOD SUGARS TWICE A DAY  DX:E11.65 100 each 12  . insulin  aspart (NOVOLOG FLEXPEN) 100 UNIT/ML FlexPen Inject 12 units 3 times per day with meals not to exceed 50 units 15 pen 1  . insulin degludec (TRESIBA FLEXTOUCH) 200 UNIT/ML FlexTouch Pen INJECT 66 UNITS SQ DAILY 15 mL 3  . Insulin Pen Needle (B-D ULTRAFINE III SHORT PEN) 31G X 8 MM MISC Use as directed with insulin pen 100 each 3  . JANUMET 50-1000 MG tablet TAKE 1 TABLET BY MOUTH TWICE A DAY 180 tablet 1  . JARDIANCE 10 MG TABS tablet TAKE 1 TABLET BY MOUTH EVERY MORNING 90 tablet 1  . lisinopril (ZESTRIL) 10 MG tablet TAKE 1 TABLET BY MOUTH EVERY DAY 90 tablet 1  . MAGNESIUM PO Take by mouth. Take 4 tablets d    . Multiple Vitamins-Minerals (MULTIVITAMIN WITH MINERALS) tablet Take 1 tablet by mouth daily.    Marland Kitchen PRILOSEC OTC 20 MG tablet Take 20 mg by mouth daily.     . sertraline (ZOLOFT) 50 MG tablet TAKE 1 TABLET BY MOUTH EVERY DAY (Patient not taking: Reported on 06/11/2020) 90 tablet 0   No current facility-administered medications for this visit.   Allergies: Allergies  Allergen Reactions  . Phenothiazines     Bells palsy   Social History: Social History   Socioeconomic History  . Marital status: Single    Spouse name: Not on file  . Number of children: Not on file  . Years of education: Not on file  . Highest education level: Not on file  Occupational History  . Not on file  Tobacco Use  . Smoking status: Never Smoker  . Smokeless tobacco: Never Used  Vaping Use  . Vaping Use: Never used  Substance and Sexual Activity  . Alcohol use: Not Currently    Comment: occasional  . Drug use: Never  . Sexual activity: Not on file  Other Topics Concern  . Not on file  Social History Narrative  . Not on file   Social Determinants of Health   Financial Resource Strain:   . Difficulty of Paying Living  Expenses: Not on file  Food Insecurity:   . Worried About Charity fundraiser in the Last Year: Not on file  . Ran Out of Food in the Last Year: Not on file  Transportation Needs:   . Lack of Transportation (Medical): Not on file  . Lack of Transportation (Non-Medical): Not on file  Physical Activity:   . Days of Exercise per Week: Not on file  . Minutes of Exercise per Session: Not on file  Stress:   . Feeling of Stress : Not on file  Social Connections:   . Frequency of Communication with Friends and Family: Not on file  . Frequency of Social Gatherings with Friends and Family: Not on file  . Attends Religious Services: Not on file  . Active Member of Clubs or Organizations: Not on file  . Attends Archivist Meetings: Not on file  . Marital Status: Not on file   Lives in a ***. Smoking: *** Occupation: ***  Environmental HistoryFreight forwarder in the house: Estate agent in the family room: {Blank single:19197::"yes","no"} Carpet in the bedroom: {Blank single:19197::"yes","no"} Heating: {Blank single:19197::"electric","gas"} Cooling: {Blank single:19197::"central","window"} Pet: {Blank single:19197::"yes ***","no"}  Family History: Family History  Problem Relation Age of Onset  . Kidney failure Mother   . Liver disease Mother   . Early death Father   . Hypertension Brother   . Colon cancer Neg Hx   . Stomach cancer  Neg Hx    Problem                               Relation Asthma                                   *** Eczema                                *** Food allergy                          *** Allergic rhino conjunctivitis     ***  Review of Systems  Constitutional: Negative for appetite change, chills, fever and unexpected weight change.  HENT: Negative for congestion and rhinorrhea.   Eyes: Negative for itching.  Respiratory: Negative for cough, chest tightness, shortness of breath and wheezing.   Cardiovascular:  Negative for chest pain.  Gastrointestinal: Negative for abdominal pain.  Genitourinary: Negative for difficulty urinating.  Skin: Negative for rash.  Neurological: Negative for headaches.   Objective: There were no vitals taken for this visit. There is no height or weight on file to calculate BMI. Physical Exam Vitals and nursing note reviewed.  Constitutional:      Appearance: Normal appearance. She is well-developed.  HENT:     Head: Normocephalic and atraumatic.     Right Ear: External ear normal.     Left Ear: External ear normal.     Nose: Nose normal.     Mouth/Throat:     Mouth: Mucous membranes are moist.     Pharynx: Oropharynx is clear.  Eyes:     Conjunctiva/sclera: Conjunctivae normal.  Cardiovascular:     Rate and Rhythm: Normal rate and regular rhythm.     Heart sounds: Normal heart sounds. No murmur heard.  No friction rub. No gallop.   Pulmonary:     Effort: Pulmonary effort is normal.     Breath sounds: Normal breath sounds. No wheezing, rhonchi or rales.  Abdominal:     Palpations: Abdomen is soft.  Musculoskeletal:     Cervical back: Neck supple.  Skin:    General: Skin is warm.     Findings: No rash.  Neurological:     Mental Status: She is alert and oriented to person, place, and time.  Psychiatric:        Behavior: Behavior normal.    The plan was reviewed with the patient/family, and all questions/concerned were addressed.  It was my pleasure to see Meagan [name] today and participate in her care. Please feel free to contact me with any questions or concerns.  Sincerely,  Rexene Alberts, DO Allergy & Immunology  [facility] office: [place] office: [phone]

## 2020-09-12 ENCOUNTER — Telehealth: Payer: Self-pay

## 2020-09-12 ENCOUNTER — Ambulatory Visit: Payer: BC Managed Care – PPO | Admitting: Allergy

## 2020-09-12 NOTE — Telephone Encounter (Signed)
Patient notified

## 2020-09-12 NOTE — Telephone Encounter (Signed)
-----   Message from Glendale Chard, MD sent at 09/11/2020  9:41 PM EDT ----- Faythe Ghee, she should stop the medication. Pls print out med list and I will call something else in tomorrow ----- Message ----- From: Michelle Nasuti, CMA Sent: 09/11/2020   3:50 PM EDT To: Glendale Chard, MD  The pt said that she thinks that she is allergic to lisinopril because she started having itching, swelling on the left side of her face.  The pt said she took benadryl and that it helped with the itching, the pt said she didn't take the lisinopril yesterday and that she didn't have the swelling or itching that bad today

## 2020-09-13 ENCOUNTER — Telehealth: Payer: Self-pay

## 2020-09-13 MED ORDER — AMLODIPINE BESYLATE 5 MG PO TABS: 5.0000 mg | ORAL_TABLET | Freq: Every day | ORAL | 1 refills | Status: DC

## 2020-09-13 NOTE — Telephone Encounter (Signed)
The pt was notified that Dr. Baird Cancer has sent amlodipine 5 mg to the pharmacy and that the pt needed to have a f/u in 4 wks.  appt scheduled.

## 2020-10-06 ENCOUNTER — Other Ambulatory Visit: Payer: Self-pay | Admitting: Internal Medicine

## 2020-10-09 ENCOUNTER — Other Ambulatory Visit: Payer: Self-pay

## 2020-10-09 DIAGNOSIS — E782 Mixed hyperlipidemia: Secondary | ICD-10-CM

## 2020-10-09 MED ORDER — ATORVASTATIN CALCIUM 10 MG PO TABS: 10.0000 mg | ORAL_TABLET | Freq: Every day | ORAL | 1 refills | Status: DC

## 2020-10-17 ENCOUNTER — Ambulatory Visit: Payer: BC Managed Care – PPO | Admitting: Internal Medicine

## 2020-10-24 ENCOUNTER — Ambulatory Visit: Payer: BC Managed Care – PPO | Admitting: Nurse Practitioner

## 2020-10-24 ENCOUNTER — Encounter: Payer: Self-pay | Admitting: Nurse Practitioner

## 2020-10-24 ENCOUNTER — Other Ambulatory Visit: Payer: Self-pay | Admitting: Nurse Practitioner

## 2020-10-24 ENCOUNTER — Other Ambulatory Visit: Payer: Self-pay

## 2020-10-24 VITALS — BP 130/78 | HR 87 | Temp 98.5°F | Ht 62.4 in | Wt 191.4 lb

## 2020-10-24 DIAGNOSIS — I129 Hypertensive chronic kidney disease with stage 1 through stage 4 chronic kidney disease, or unspecified chronic kidney disease: Secondary | ICD-10-CM

## 2020-10-24 DIAGNOSIS — E1122 Type 2 diabetes mellitus with diabetic chronic kidney disease: Secondary | ICD-10-CM | POA: Diagnosis not present

## 2020-10-24 DIAGNOSIS — Z23 Encounter for immunization: Secondary | ICD-10-CM

## 2020-10-24 DIAGNOSIS — E782 Mixed hyperlipidemia: Secondary | ICD-10-CM

## 2020-10-24 DIAGNOSIS — N182 Chronic kidney disease, stage 2 (mild): Secondary | ICD-10-CM | POA: Diagnosis not present

## 2020-10-24 DIAGNOSIS — Z6834 Body mass index (BMI) 34.0-34.9, adult: Secondary | ICD-10-CM

## 2020-10-24 DIAGNOSIS — F419 Anxiety disorder, unspecified: Secondary | ICD-10-CM

## 2020-10-24 DIAGNOSIS — E6609 Other obesity due to excess calories: Secondary | ICD-10-CM

## 2020-10-24 DIAGNOSIS — Z794 Long term (current) use of insulin: Secondary | ICD-10-CM

## 2020-10-24 LAB — POCT UA - MICROALBUMIN
Creatinine, POC: 300 mg/dL
Microalbumin Ur, POC: 80 mg/L

## 2020-10-24 MED ORDER — SERTRALINE HCL 50 MG PO TABS: 50.0000 mg | ORAL_TABLET | Freq: Every day | ORAL | 0 refills | Status: DC

## 2020-10-24 NOTE — Addendum Note (Signed)
Addended by: Melba Coon on: 10/24/2020 04:09 PM   Modules accepted: Orders

## 2020-10-24 NOTE — Progress Notes (Signed)
I,[name],acting as a Education administrator for [employer], NP.,have documented all relevant documentation on the behalf of [employer], NP,as directed by  [name], NP while in the presence of [name], NP.  This visit occurred during the SARS-CoV-2 public health emergency.  Safety protocols were in place, including screening questions prior to the visit, additional usage of staff PPE, and extensive cleaning of exam room while observing appropriate contact time as indicated for disinfecting solutions.  Subjective:     Patient ID: Meagan [name] , female    DOB: 06-Nov-1961 , 59 y.o.   MRN: [id number]   Chief Complaint  Patient presents with  . Diabetes    HPI  She is here today for dm/bp check. She admits that she is not exercising on a regular basis.   She does not have a big appetite. She doesn't eat too much. Drinking a lot of water. Not exercising much. Just normal walking.  She is also requesting her refill on Zoloft which has helped her with her anxiety. She does not have any other complains or concerns today.   Wt Readings from Last 3 Encounters: 10/24/20 : 191 lb 6.4 oz (86.8 kg) 06/12/20 : 201 lb 9.6 oz (91.4 kg) 03/06/20 : 186 lb 3.2 oz (84.5 kg)    Diabetes She presents for her follow-up diabetic visit. She has type 2 diabetes mellitus. Her disease course has been improving. Pertinent negatives for hypoglycemia include no hunger. Pertinent negatives for diabetes include no blurred vision, no chest pain and no visual change. There are no hypoglycemic complications. Symptoms are improving. Diabetic complications include nephropathy. Risk factors for coronary artery disease include hypertension, obesity and diabetes mellitus. Current diabetic treatment includes insulin injections and oral agent (monotherapy). She is compliant with treatment most of the time. She is following a generally healthy diet. She has not had a previous visit with a dietitian. She  participates in exercise intermittently. Her breakfast blood glucose is taken between 9-10 am. Her breakfast blood glucose range is generally 110-130 mg/dl. She does not see a podiatrist.Eye exam is current.  Hypertension This is a chronic problem. The current episode started more than 1 year ago. Associated symptoms include anxiety. Pertinent negatives include no blurred vision, chest pain or palpitations. Risk factors for coronary artery disease include diabetes mellitus, dyslipidemia, obesity, post-menopausal state and sedentary lifestyle. Past treatments include ACE inhibitors. Compliance problems include exercise.   Anxiety Presents for follow-up visit. Patient reports no chest pain, obsessions or palpitations. Symptoms occur occasionally. The severity of symptoms is moderate. The quality of sleep is good. Nighttime awakenings: none.       Past Medical History:  Diagnosis Date  . Diabetes mellitus ([place])   . GERD (gastroesophageal reflux disease)   . Hyperlipidemia   . Hypertension      Family History  Problem Relation Age of Onset  . Kidney failure Mother   . Liver disease Mother   . Early death Father   . Hypertension Brother   . Colon cancer Neg Hx   . Stomach cancer Neg Hx      Current Outpatient Medications:  .  albuterol (PROAIR HFA) 108 (90 Base) MCG/ACT inhaler, Inhale 1-2 puffs into the lungs every 6 (six) hours as needed for wheezing or shortness of breath., Disp: 1 Inhaler, Rfl: 0 .  amLODipine (NORVASC) 5 MG tablet, TAKE 1 TABLET BY MOUTH EVERY DAY, Disp: 30 tablet, Rfl: 1 .  atorvastatin (LIPITOR) 10 MG tablet, Take 1 tablet (10 mg total)  by mouth daily., Disp: 90 tablet, Rfl: 1 .  Cholecalciferol (DIALYVITE VITAMIN D 5000 PO), Take 2 capsules by mouth daily., Disp: , Rfl:  .  glucose blood (ONETOUCH VERIO) test strip, USE TO CHECK BLOOD SUGARS TWICE A DAY  DX:E11.65, Disp: 100 each, Rfl: 12 .  insulin aspart (NOVOLOG FLEXPEN) 100 UNIT/ML FlexPen, Inject 12 units 3  times per day with meals not to exceed 50 units, Disp: 15 pen, Rfl: 1 .  insulin degludec (TRESIBA FLEXTOUCH) 200 UNIT/ML FlexTouch Pen, INJECT 66 UNITS SQ DAILY, Disp: 15 mL, Rfl: 3 .  Insulin Pen Needle (B-D ULTRAFINE III SHORT PEN) 31G X 8 MM MISC, Use as directed with insulin pen, Disp: 100 each, Rfl: 3 .  JANUMET 50-1000 MG tablet, TAKE 1 TABLET BY MOUTH TWICE A DAY, Disp: 180 tablet, Rfl: 1 .  JARDIANCE 10 MG TABS tablet, TAKE 1 TABLET BY MOUTH EVERY MORNING, Disp: 90 tablet, Rfl: 1 .  MAGNESIUM PO, Take by mouth. Take 4 tablets d, Disp: , Rfl:  .  Multiple Vitamins-Minerals (MULTIVITAMIN WITH MINERALS) tablet, Take 1 tablet by mouth daily., Disp: , Rfl:  .  PRILOSEC OTC 20 MG tablet, Take 20 mg by mouth daily. , Disp: , Rfl:  .  sertraline (ZOLOFT) 50 MG tablet, Take 1 tablet (50 mg total) by mouth daily., Disp: 90 tablet, Rfl: 0   Allergies  Allergen Reactions  . Lisinopril Itching and Swelling  . Phenothiazines     Bells palsy     Review of Systems  Constitutional: Negative.   Eyes: Negative for blurred vision and visual disturbance.  Respiratory: Negative.  Negative for cough, chest tightness and wheezing.   Cardiovascular: Negative.  Negative for chest pain and palpitations.  Gastrointestinal: Negative.   Neurological: Negative.   Psychiatric/Behavioral: Negative for agitation and sleep disturbance.     Today's Vitals   10/24/20 0946  BP: 130/78  Pulse: 87  Temp: 98.5 F (36.9 C)  TempSrc: Oral  Weight: 191 lb 6.4 oz (86.8 kg)  Height: 5' 2.4" (1.585 m)   Body mass index is 34.56 kg/m.  Wt Readings from Last 3 Encounters:  10/24/20 191 lb 6.4 oz (86.8 kg)  06/12/20 201 lb 9.6 oz (91.4 kg)  03/06/20 186 lb 3.2 oz (84.5 kg)      Objective:  Physical Exam Constitutional:      General: She is not in acute distress.    Appearance: Normal appearance. She is obese.  Cardiovascular:     Rate and Rhythm: Normal rate and regular rhythm.     Pulses: Normal pulses.      Heart sounds: Normal heart sounds. No murmur heard.   Pulmonary:     Effort: Pulmonary effort is normal.     Breath sounds: Normal breath sounds. No wheezing.  Neurological:     Mental Status: She is alert and oriented to person, place, and time.  Psychiatric:        Mood and Affect: Mood normal.        Behavior: Behavior normal.        Judgment: Judgment normal.         Assessment And Plan:     1. Type 2 diabetes mellitus with stage 2 chronic kidney disease, with long-term current use of insulin (HCC) - POCT UA - Microalbumin -Patient is in compliance with medication however does not exercise as much as she should -Educated patient on eating a healthy diet which includes low carbs and more fruits and vegetables.  Decrease the intake of sugar. Increase the intake of water.   -Will check labs at next visit.   2. Benign hypertensive renal disease -BP is controlled with medication  -Patient was educated on a healthy diet which included avoiding salt intake, avoid processed foods, decrease carb's, soda and increasing the intake of fruits and vegetables.  -will check labs at next visit.    3. Need for influenza vaccination - Flu Vaccine QUAD 6+ mos PF IM (Fluarix Quad PF) -Given today   4. Class 1 obesity due to excess calories with serious comorbidity and body mass index (BMI) of 34.0 to 34.9 in adult -Educated patient on the need to exercise 4-5 a week for atleast 30 min each day. -Educated patient on eating healthy meals and avoiding the intake of saturated fats. Increasing intake of fruits and vegetables and avoiding soda. -Avoid red meats.    5. Anxiety  - sertraline (ZOLOFT) 50 MG tablet; Take 1 tablet (50 mg total) by mouth daily.  Dispense: 90 tablet; Refill: 0 -Patient requested refill and seemed to be doing well with the Zoloft with her anxiety.  -Advised patient to use methods of stress relievers such as exercising, relaxation methods.  -Educated patient if she  needed counseling to let us know and we can refer her.    She is encouraged to strive for BMI less than 30 to decrease cardiac risk. Advised to aim for at least 150 minutes of exercise per week.    Patient was given opportunity to ask questions. Patient verbalized understanding of the plan and was able to repeat key elements of the plan. All questions were answered to their satisfaction.  [name], NP   I, [name], NP, have reviewed all documentation for this visit. The documentation on 10/24/20 for the exam, diagnosis, procedures, and orders are all accurate and complete.  THE PATIENT IS ENCOURAGED TO PRACTICE SOCIAL DISTANCING DUE TO THE COVID-19 PANDEMIC.

## 2020-10-24 NOTE — Patient Instructions (Signed)

## 2020-10-29 ENCOUNTER — Other Ambulatory Visit: Payer: Self-pay | Admitting: Internal Medicine

## 2020-10-30 ENCOUNTER — Other Ambulatory Visit: Payer: Self-pay | Admitting: Internal Medicine

## 2020-10-30 LAB — CMP14+EGFR
ALT: 6 IU/L (ref 0–32)
AST: 7 IU/L (ref 0–40)
Albumin/Globulin Ratio: 1.1 — ABNORMAL LOW (ref 1.2–2.2)
Albumin: 3.7 g/dL — ABNORMAL LOW (ref 3.8–4.9)
Alkaline Phosphatase: 91 IU/L (ref 44–121)
BUN/Creatinine Ratio: 12 (ref 9–23)
BUN: 12 mg/dL (ref 6–24)
Bilirubin Total: 0.3 mg/dL (ref 0.0–1.2)
CO2: 18 mmol/L — ABNORMAL LOW (ref 20–29)
Calcium: 9.2 mg/dL (ref 8.7–10.2)
Chloride: 105 mmol/L (ref 96–106)
Creatinine, Ser: 1.02 mg/dL — ABNORMAL HIGH (ref 0.57–1.00)
GFR calc Af Amer: 70 mL/min/{1.73_m2} (ref >59)
GFR calc non Af Amer: 60 mL/min/{1.73_m2} (ref >59)
Globulin, Total: 3.4 g/dL (ref 1.5–4.5)
Glucose: 110 mg/dL — ABNORMAL HIGH (ref 65–99)
Potassium: 4.4 mmol/L (ref 3.5–5.2)
Sodium: 139 mmol/L (ref 134–144)
Total Protein: 7.1 g/dL (ref 6.0–8.5)

## 2020-10-30 LAB — LIPID PANEL
Chol/HDL Ratio: 4.1 ratio (ref 0.0–4.4)
Cholesterol, Total: 152 mg/dL (ref 100–199)
HDL: 37 mg/dL — ABNORMAL LOW (ref >39)
LDL Chol Calc (NIH): 97 mg/dL (ref 0–99)
Triglycerides: 97 mg/dL (ref 0–149)
VLDL Cholesterol Cal: 18 mg/dL (ref 5–40)

## 2020-10-30 LAB — HEMOGLOBIN A1C
Est. average glucose Bld gHb Est-mCnc: 183 mg/dL
Hgb A1c MFr Bld: 8 % — ABNORMAL HIGH (ref 4.8–5.6)

## 2020-11-05 ENCOUNTER — Other Ambulatory Visit: Payer: Self-pay | Admitting: Internal Medicine

## 2020-11-07 ENCOUNTER — Other Ambulatory Visit: Payer: Self-pay

## 2020-11-07 MED ORDER — EMPAGLIFLOZIN 10 MG PO TABS
10.0000 mg | ORAL_TABLET | Freq: Every morning | ORAL | 1 refills | Status: DC
Start: 2020-11-07 — End: 2021-05-21

## 2020-11-15 ENCOUNTER — Encounter: Payer: Self-pay | Admitting: Nurse Practitioner

## 2020-11-15 ENCOUNTER — Ambulatory Visit (INDEPENDENT_AMBULATORY_CARE_PROVIDER_SITE_OTHER): Payer: BC Managed Care – PPO | Admitting: Nurse Practitioner

## 2020-11-15 ENCOUNTER — Ambulatory Visit
Admission: RE | Admit: 2020-11-15 | Discharge: 2020-11-15 | Disposition: A | Discharge status: Home / Self Care | Payer: BC Managed Care – PPO | Source: Ambulatory Visit | Attending: Nurse Practitioner | Admitting: Nurse Practitioner

## 2020-11-15 ENCOUNTER — Other Ambulatory Visit: Payer: Self-pay

## 2020-11-15 VITALS — BP 130/80 | HR 99 | Temp 98.0°F

## 2020-11-15 DIAGNOSIS — R059 Cough, unspecified: Secondary | ICD-10-CM | POA: Diagnosis not present

## 2020-11-15 MED ORDER — BENZONATATE 100 MG PO CAPS
100.0000 mg | ORAL_CAPSULE | Freq: Three times a day (TID) | ORAL | 0 refills | Status: DC | PRN
Start: 2020-11-15 — End: 2021-06-18

## 2020-11-15 MED ORDER — AZITHROMYCIN 250 MG PO TABS: ORAL_TABLET | ORAL | 0 refills | Status: AC

## 2020-11-15 NOTE — Patient Instructions (Signed)

## 2020-11-15 NOTE — Progress Notes (Signed)
I,[name],acting as a Education administrator for [employer], NP.,have documented all relevant documentation on the behalf of [employer], NP,as directed by  [name], NP while in the presence of [name], NP.  This visit occurred during the SARS-CoV-2 public health emergency.  Safety protocols were in place, including screening questions prior to the visit, additional usage of staff PPE, and extensive cleaning of exam room while observing appropriate contact time as indicated for disinfecting solutions.  Subjective:     Patient ID: Meagan [name] , female    DOB: [birth year] , 59 y.o.   MRN: [id number]   Chief Complaint  Patient presents with   Cough    HPI  Patient is here today for a cough. This was a outside visit. Patient has had a cough for the past 2 months. She does not have a fever, no shortness of breath. She does present with some myalgia in her chest due to her coughing so much. Patient is not coughing up anything sometimes coughs up clear mucus. Patient does have a history of bronchitis and pneumonia.  She has been vaccinated with both doses and a booster. Patient's O2 was 99%. Temp 98 F.   No Nausea/vomiting.  Cough This is a recurrent problem. The current episode started more than 1 month ago. The problem has been unchanged. The problem occurs constantly. Associated symptoms include myalgias. Pertinent negatives include no chest pain, chills, fever or shortness of breath. Her past medical history is significant for bronchitis and pneumonia. There is no history of asthma.     Past Medical History:  Diagnosis Date   Diabetes mellitus ([place])    GERD (gastroesophageal reflux disease)    Hyperlipidemia    Hypertension      Family History  Problem Relation Age of Onset   Kidney failure Mother    Liver disease Mother    Early death Father    Hypertension Brother    Colon cancer Neg Hx    Stomach cancer Neg Hx      Current Outpatient  Medications:    albuterol (PROAIR HFA) 108 (90 Base) MCG/ACT inhaler, Inhale 1-2 puffs into the lungs every 6 (six) hours as needed for wheezing or shortness of breath., Disp: 1 Inhaler, Rfl: 0   amLODipine (NORVASC) 5 MG tablet, TAKE 1 TABLET BY MOUTH EVERY DAY, Disp: 30 tablet, Rfl: 1   atorvastatin (LIPITOR) 10 MG tablet, Take 1 tablet (10 mg total) by mouth daily., Disp: 90 tablet, Rfl: 1   azithromycin (ZITHROMAX) 250 MG tablet, Take 2 tablets (500 mg) on  Day 1,  followed by 1 tablet (250 mg) once daily on Days 2 through 5., Disp: 6 each, Rfl: 0   benzonatate (TESSALON PERLES) 100 MG capsule, Take 1 capsule (100 mg total) by mouth 3 (three) times daily as needed for cough., Disp: 30 capsule, Rfl: 0   Cholecalciferol (DIALYVITE VITAMIN D 5000 PO), Take 2 capsules by mouth daily., Disp: , Rfl:    empagliflozin (JARDIANCE) 10 MG TABS tablet, Take 1 tablet (10 mg total) by mouth every morning., Disp: 90 tablet, Rfl: 1   glucose blood (ONETOUCH VERIO) test strip, USE TO CHECK BLOOD SUGARS TWICE A DAY  DX:E11.65, Disp: 100 each, Rfl: 12   insulin aspart (NOVOLOG FLEXPEN) 100 UNIT/ML FlexPen, Inject 12 units 3 times per day with meals not to exceed 50 units, Disp: 15 pen, Rfl: 1   insulin degludec (TRESIBA FLEXTOUCH) 200 UNIT/ML FlexTouch Pen, INJECT 66 UNITS SQ DAILY, Disp: 15 mL,  Rfl: 3   Insulin Pen Needle (B-D ULTRAFINE III SHORT PEN) 31G X 8 MM MISC, Use as directed with insulin pen, Disp: 100 each, Rfl: 3   JANUMET 50-1000 MG tablet, TAKE 1 TABLET BY MOUTH TWICE A DAY, Disp: 180 tablet, Rfl: 1   MAGNESIUM PO, Take by mouth. Take 4 tablets d, Disp: , Rfl:    Multiple Vitamins-Minerals (MULTIVITAMIN WITH MINERALS) tablet, Take 1 tablet by mouth daily., Disp: , Rfl:    PRILOSEC OTC 20 MG tablet, Take 20 mg by mouth daily. , Disp: , Rfl:    sertraline (ZOLOFT) 50 MG tablet, Take 1 tablet (50 mg total) by mouth daily., Disp: 90 tablet, Rfl: 0   Allergies  Allergen Reactions    Lisinopril Itching and Swelling   Phenothiazines     Bells palsy     Review of Systems  Constitutional: Negative for chills and fever.  HENT: Positive for congestion.   Respiratory: Positive for cough. Negative for shortness of breath.   Cardiovascular: Negative for chest pain.  Musculoskeletal: Positive for myalgias.       Mild myalgia in her chest due to her coughing so much.      Today's Vitals   11/15/20 0900  BP: 130/80  Pulse: 99  Temp: 98 F (36.7 C)  TempSrc: Oral  SpO2: 99%   There is no height or weight on file to calculate BMI.   Objective:  Physical Exam Cardiovascular:     Rate and Rhythm: Normal rate and regular rhythm.     Pulses: Normal pulses.     Heart sounds: Normal heart sounds. No murmur heard.   Pulmonary:     Effort: No respiratory distress.     Breath sounds: Wheezing present.     Comments: Some light wheezes in the lower lobes  Chest:     Chest wall: No tenderness.  Neurological:     Mental Status: She is alert.  Psychiatric:        Mood and Affect: Mood normal.        Behavior: Behavior normal.         Assessment And Plan:     1. Cough -Cough x 2 months and also presents with congestion today so will go ahead get a chest x-ray and treat with z-pack due to history of bronchitis and pneumonia and comorbidities. -Will swab for COVID- 22   -Educated patient to take rest and drink plenty of fluids. -Take vitamin C, D and zinc  -Call if symptoms worsen, experience any SOB seek emergency care - benzonatate (TESSALON PERLES) 100 MG capsule; Take 1 capsule (100 mg total) by mouth 3 (three) times daily as needed for cough.  Dispense: 30 capsule; Refill: 0 - azithromycin (ZITHROMAX) 250 MG tablet; Take 2 tablets (500 mg) on  Day 1,  followed by 1 tablet (250 mg) once daily on Days 2 through 5.  Dispense: 6 each; Refill: 0 - DG Chest 2 View; Future - Novel Coronavirus, NAA ([facility])    Patient was given opportunity to ask questions. Patient  verbalized understanding of the plan and was able to repeat key elements of the plan. All questions were answered to their satisfaction.  [name], NP   I, [name], NP, have reviewed all documentation for this visit. The documentation on 11/15/20 for the exam, diagnosis, procedures, and orders are all accurate and complete.  THE PATIENT IS ENCOURAGED TO PRACTICE SOCIAL DISTANCING DUE TO THE COVID-19 PANDEMIC.

## 2020-11-16 LAB — NOVEL CORONAVIRUS, NAA: SARS-CoV-2, NAA: NOT DETECTED

## 2020-11-16 LAB — SARS-COV-2, NAA 2 DAY TAT

## 2020-11-27 ENCOUNTER — Other Ambulatory Visit: Payer: Self-pay | Admitting: Nurse Practitioner

## 2020-12-10 ENCOUNTER — Other Ambulatory Visit: Payer: Self-pay | Admitting: Nurse Practitioner

## 2020-12-21 LAB — HM MAMMOGRAPHY

## 2020-12-27 ENCOUNTER — Encounter: Payer: Self-pay | Admitting: Internal Medicine

## 2020-12-27 IMAGING — CR DG CHEST 2V
2 series · 2 of 2 positions shown · non-contrast
Comparison: None.

CLINICAL DATA: Cough for 2 months.  Diabetes.

EXAM:
CHEST - 2 VIEW

[w chest pa]
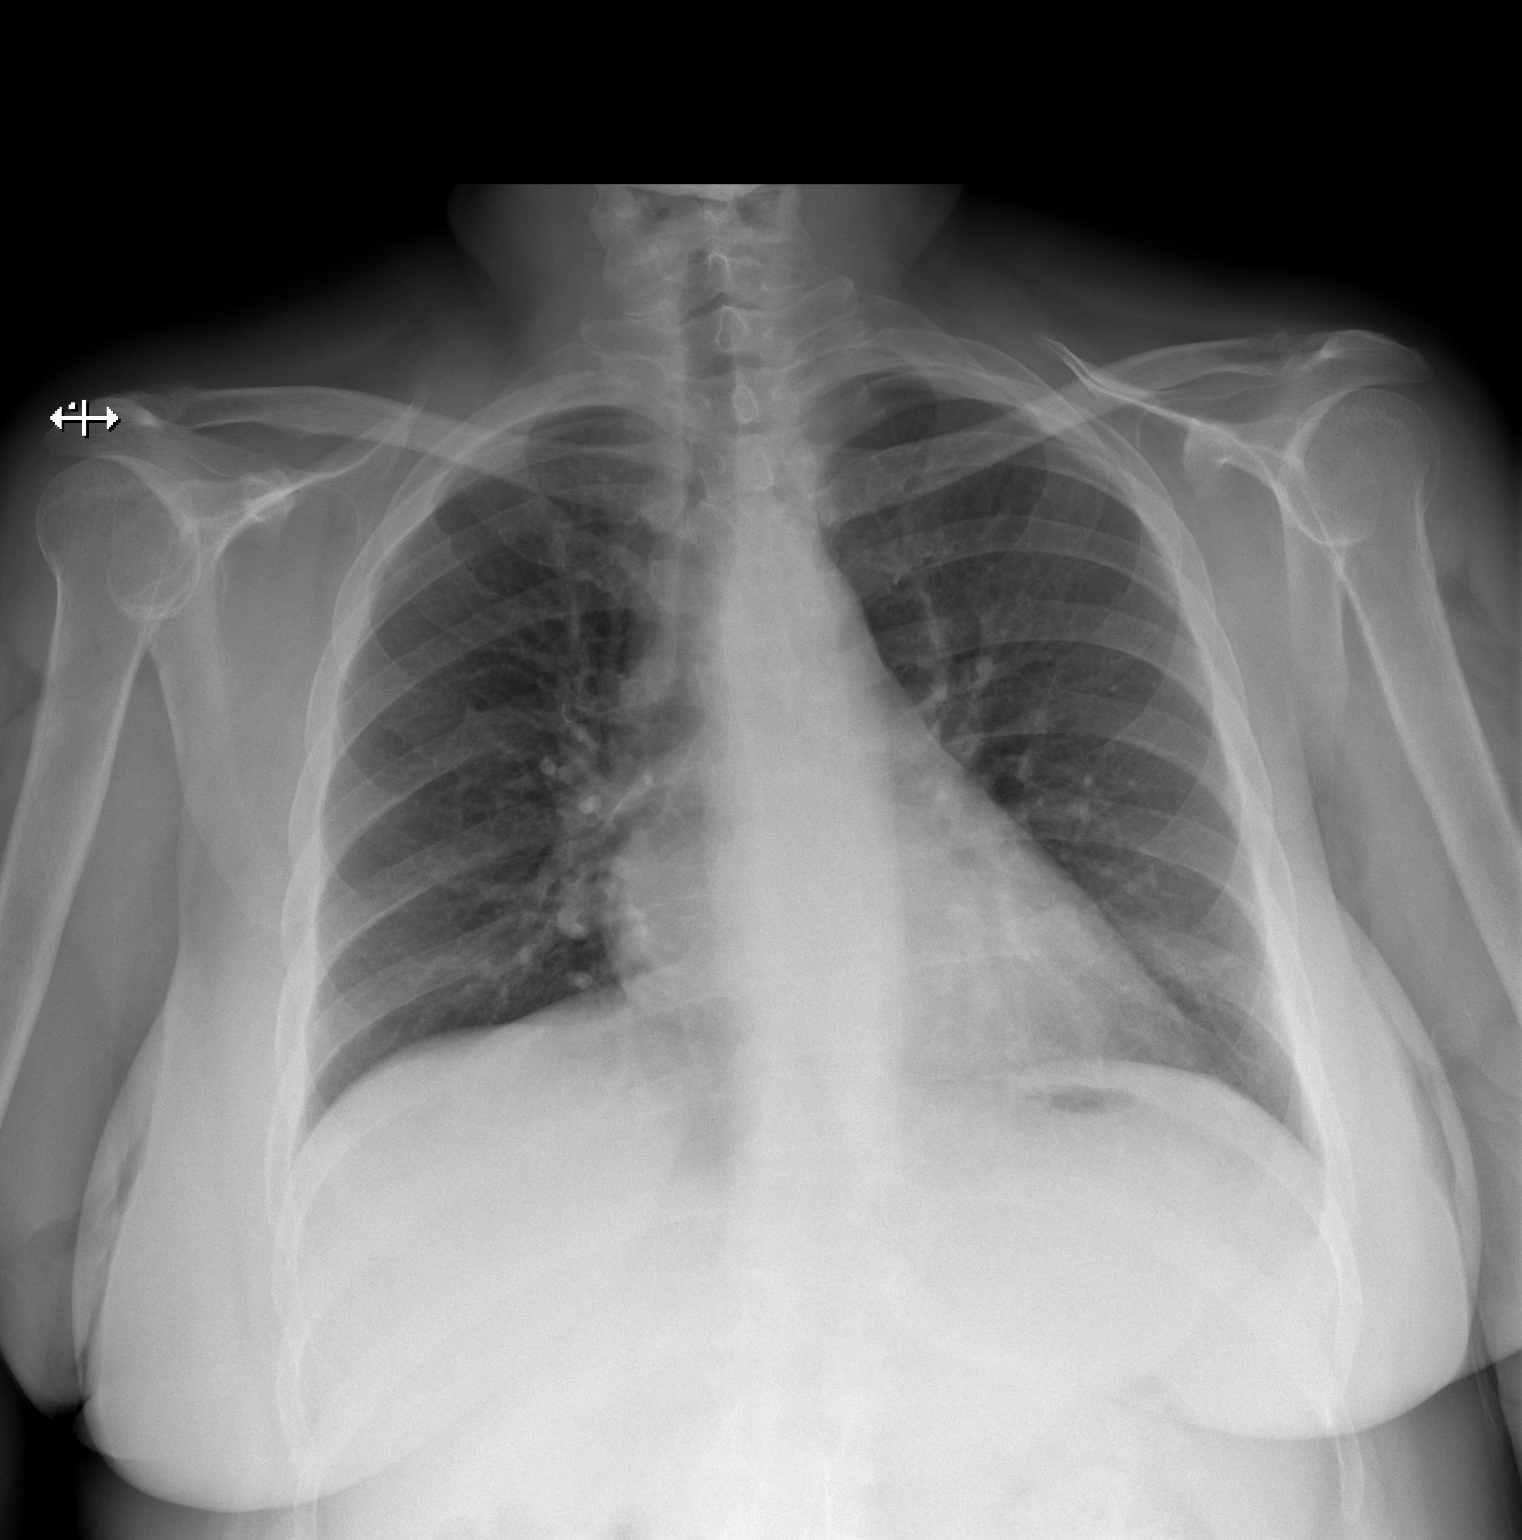

[w chest lat]
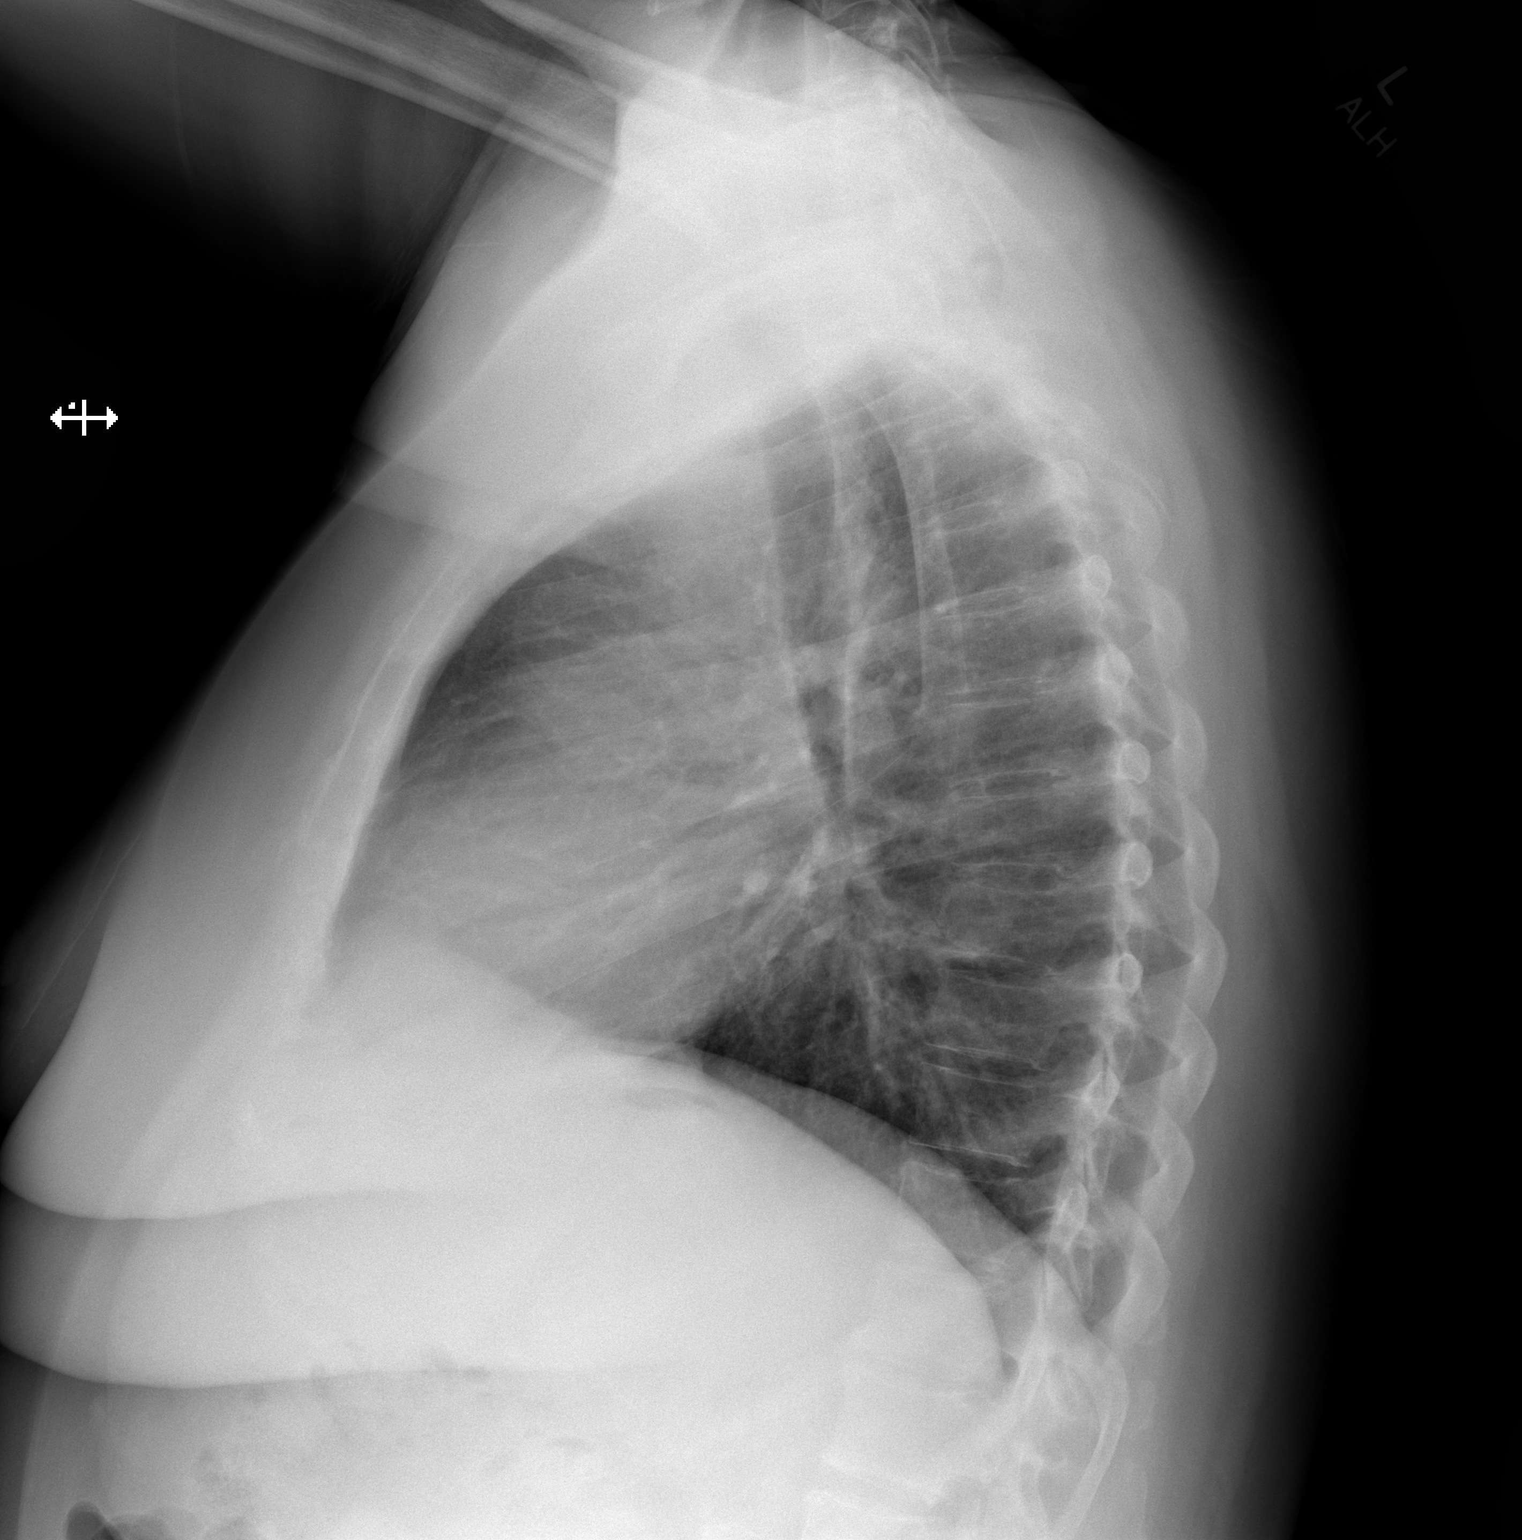

[2 of 2 positions shown; findings below may reference images not displayed]

FINDINGS: The heart size and mediastinal contours are within normal limits.
Both lungs are clear. The visualized skeletal structures are
unremarkable.
IMPRESSION: No active cardiopulmonary disease.

## 2021-01-04 ENCOUNTER — Other Ambulatory Visit: Payer: Self-pay | Admitting: Internal Medicine

## 2021-01-10 ENCOUNTER — Telehealth: Payer: Self-pay

## 2021-01-10 NOTE — Telephone Encounter (Signed)
I returned the pt's call and notified her that the pt her surgical clearance form was sent back by Laurance Flatten, [place].

## 2021-01-16 ENCOUNTER — Ambulatory Visit: Payer: BC Managed Care – PPO | Admitting: Nurse Practitioner

## 2021-01-16 ENCOUNTER — Encounter: Payer: Self-pay | Admitting: Nurse Practitioner

## 2021-01-16 ENCOUNTER — Other Ambulatory Visit: Payer: Self-pay

## 2021-01-16 ENCOUNTER — Other Ambulatory Visit: Payer: Self-pay | Admitting: Nurse Practitioner

## 2021-01-16 VITALS — BP 124/76 | HR 105 | Temp 98.1°F | Ht 62.4 in | Wt 184.0 lb

## 2021-01-16 DIAGNOSIS — I129 Hypertensive chronic kidney disease with stage 1 through stage 4 chronic kidney disease, or unspecified chronic kidney disease: Secondary | ICD-10-CM

## 2021-01-16 DIAGNOSIS — Z01818 Encounter for other preprocedural examination: Secondary | ICD-10-CM

## 2021-01-16 DIAGNOSIS — Z794 Long term (current) use of insulin: Secondary | ICD-10-CM

## 2021-01-16 DIAGNOSIS — E1122 Type 2 diabetes mellitus with diabetic chronic kidney disease: Secondary | ICD-10-CM | POA: Diagnosis not present

## 2021-01-16 DIAGNOSIS — N182 Chronic kidney disease, stage 2 (mild): Secondary | ICD-10-CM

## 2021-01-16 NOTE — Progress Notes (Signed)
I,Yamilka Roman [employer] as a Education administrator for [employer], FNP.,have documented all relevant documentation on the behalf of Minette Brine, FNP,as directed by  Minette Brine, FNP while in the presence of Minette Brine, [place]. This visit occurred during the SARS-CoV-2 public health emergency.  Safety protocols were in place, including screening questions prior to the visit, additional usage of staff PPE, and extensive cleaning of exam room while observing appropriate contact time as indicated for disinfecting solutions.  Subjective:     Patient ID: Meagan [name] , female    DOB: 01/05/1961 , 60 y.o.   MRN: [id number]   Chief Complaint  Patient presents with  . Pre-op Exam    Patient presents today to have an EKG done so that she can be scheduled for her surgery    HPI  Patient presents today for a pre op.  She is here today for her EKG for medical clearance. She drank coffee this morning about 9am.  Denies chest pain.  She had a heart murmur as a teenager. She is to have left hand carpal tunnel surgery    Past Medical History:  Diagnosis Date  . Diabetes mellitus ([place])   . GERD (gastroesophageal reflux disease)   . Hyperlipidemia   . Hypertension      Family History  Problem Relation Age of Onset  . Kidney failure Mother   . Liver disease Mother   . Early death Father   . Hypertension Brother   . Colon cancer Neg Hx   . Stomach cancer Neg Hx      Current Outpatient Medications:  .  albuterol (PROAIR HFA) 108 (90 Base) MCG/ACT inhaler, Inhale 1-2 puffs into the lungs every 6 (six) hours as needed for wheezing or shortness of breath. (Patient not taking: Reported on 02/13/2021), Disp: 1 Inhaler, Rfl: 0 .  amLODipine (NORVASC) 5 MG tablet, TAKE 1 TABLET BY MOUTH EVERY DAY, Disp: 90 tablet, Rfl: 1 .  atorvastatin (LIPITOR) 10 MG tablet, Take 1 tablet (10 mg total) by mouth daily., Disp: 90 tablet, Rfl: 1 .  benzonatate (TESSALON PERLES) 100 MG capsule, Take 1 capsule (100 mg total)  by mouth 3 (three) times daily as needed for cough., Disp: 30 capsule, Rfl: 0 .  Cholecalciferol (DIALYVITE VITAMIN D 5000 PO), Take 2 capsules by mouth daily., Disp: , Rfl:  .  empagliflozin (JARDIANCE) 10 MG TABS tablet, Take 1 tablet (10 mg total) by mouth every morning., Disp: 90 tablet, Rfl: 1 .  glucose blood (ONETOUCH VERIO) test strip, USE TO CHECK BLOOD SUGARS TWICE A DAY  DX:E11.65, Disp: 100 each, Rfl: 12 .  insulin aspart (NOVOLOG FLEXPEN) 100 UNIT/ML FlexPen, Inject 12 units 3 times per day with meals not to exceed 50 units, Disp: 15 pen, Rfl: 1 .  Insulin Pen Needle (B-D ULTRAFINE III SHORT PEN) 31G X 8 MM MISC, Use as directed with insulin pen, Disp: 100 each, Rfl: 3 .  JANUMET 50-1000 MG tablet, TAKE 1 TABLET BY MOUTH TWICE A DAY, Disp: 180 tablet, Rfl: 1 .  MAGNESIUM PO, Take by mouth. Take 4 tablets d (Patient not taking: Reported on 02/13/2021), Disp: , Rfl:  .  Multiple Vitamins-Minerals (MULTIVITAMIN WITH MINERALS) tablet, Take 1 tablet by mouth daily., Disp: , Rfl:  .  PRILOSEC OTC 20 MG tablet, Take 20 mg by mouth daily. , Disp: , Rfl:  .  sertraline (ZOLOFT) 50 MG tablet, Take 1 tablet (50 mg total) by mouth daily., Disp: 90 tablet, Rfl: 0 .  TRESIBA  FLEXTOUCH 200 UNIT/ML FlexTouch Pen, INJECT 66 UNITS SQ DAILY, Disp: 12 mL, Rfl: 1 .  celecoxib (CELEBREX) 100 MG capsule, Take 100 mg by mouth 2 (two) times daily., Disp: , Rfl:  .  gabapentin (NEURONTIN) 100 MG capsule, Take 100 mg by mouth 3 (three) times daily., Disp: , Rfl:  .  Influenza Virus Vaccine Split SUSP, Flulaval 2013-2014 45 mcg (15 mcg x 3)/0.5 mL intramuscular suspension, Disp: , Rfl:    Allergies  Allergen Reactions  . Lisinopril Itching and Swelling  . Phenothiazines     Bells palsy     Review of Systems  Constitutional: Negative.   HENT: Negative.   Eyes: Negative.   Respiratory: Negative.   Cardiovascular: Negative.   Gastrointestinal: Negative.   Endocrine: Negative.   Genitourinary: Negative.    Musculoskeletal: Negative.   Skin: Negative.   Neurological: Negative.   Hematological: Negative.   Psychiatric/Behavioral: Negative.      Today's Vitals   01/16/21 1158  BP: 124/76  Pulse: (!) 105  Temp: 98.1 F (36.7 C)  TempSrc: Oral  Weight: 184 lb (83.5 kg)  Height: 5' 2.4" (1.585 m)  PainSc: 0-No pain   Body mass index is 33.22 kg/m.   Objective:  Physical Exam Constitutional:      General: She is not in acute distress.    Appearance: Normal appearance. She is obese.  Cardiovascular:     Rate and Rhythm: Normal rate and regular rhythm.     Pulses: Normal pulses.     Heart sounds: Normal heart sounds.  Pulmonary:     Effort: Pulmonary effort is normal.     Breath sounds: Normal breath sounds.  Abdominal:     General: Abdomen is flat. Bowel sounds are normal.     Palpations: Abdomen is soft.  Musculoskeletal:        General: Normal range of motion.     Cervical back: Normal range of motion and neck supple.  Skin:    General: Skin is warm and dry.     Capillary Refill: Capillary refill takes less than 2 seconds.  Neurological:     General: No focal deficit present.     Mental Status: She is alert and oriented to person, place, and time.  Psychiatric:        Mood and Affect: Mood normal.        Behavior: Behavior normal.        Thought Content: Thought content normal.        Judgment: Judgment normal.         Assessment And Plan:     1. Pre-op exam  EKG done with HR 105  She is cleared for surgery but may need to see a cardiologist in the future if this persists - EKG 12-Lead     Patient was given opportunity to ask questions. Patient verbalized understanding of the plan and was able to repeat key elements of the plan. All questions were answered to their satisfaction.  Minette Brine, FNP   I, Minette Brine, FNP, have reviewed all documentation for this visit. The documentation on 02/19/21 for the exam, diagnosis, procedures, and orders are all  accurate and complete.  THE PATIENT IS ENCOURAGED TO PRACTICE SOCIAL DISTANCING DUE TO THE COVID-19 PANDEMIC.

## 2021-01-31 HISTORY — PX: CARPAL TUNNEL RELEASE: SHX101

## 2021-02-01 ENCOUNTER — Encounter: Payer: Self-pay | Admitting: Internal Medicine

## 2021-02-13 ENCOUNTER — Ambulatory Visit (INDEPENDENT_AMBULATORY_CARE_PROVIDER_SITE_OTHER): Payer: BC Managed Care – PPO | Admitting: Internal Medicine

## 2021-02-13 ENCOUNTER — Encounter: Payer: Self-pay | Admitting: Internal Medicine

## 2021-02-13 ENCOUNTER — Other Ambulatory Visit: Payer: Self-pay

## 2021-02-13 VITALS — BP 126/88 | HR 74 | Temp 98.7°F | Ht 62.4 in | Wt 183.0 lb

## 2021-02-13 DIAGNOSIS — E6609 Other obesity due to excess calories: Secondary | ICD-10-CM | POA: Diagnosis not present

## 2021-02-13 DIAGNOSIS — N182 Chronic kidney disease, stage 2 (mild): Secondary | ICD-10-CM

## 2021-02-13 DIAGNOSIS — I129 Hypertensive chronic kidney disease with stage 1 through stage 4 chronic kidney disease, or unspecified chronic kidney disease: Secondary | ICD-10-CM

## 2021-02-13 DIAGNOSIS — E66811 Obesity, class 1: Secondary | ICD-10-CM

## 2021-02-13 DIAGNOSIS — Z23 Encounter for immunization: Secondary | ICD-10-CM

## 2021-02-13 DIAGNOSIS — E1122 Type 2 diabetes mellitus with diabetic chronic kidney disease: Secondary | ICD-10-CM | POA: Diagnosis not present

## 2021-02-13 DIAGNOSIS — Z6833 Body mass index (BMI) 33.0-33.9, adult: Secondary | ICD-10-CM

## 2021-02-13 DIAGNOSIS — Z794 Long term (current) use of insulin: Secondary | ICD-10-CM

## 2021-02-13 NOTE — Patient Instructions (Signed)
Diabetes Mellitus and Foot Care Foot care is an important part of your health, especially when you have diabetes. Diabetes may cause you to have problems because of poor blood flow (circulation) to your feet and legs, which can cause your skin to:  Become thinner and drier.  Break more easily.  Heal more slowly.  Peel and crack. You may also have nerve damage (neuropathy) in your legs and feet, causing decreased feeling in them. This means that you may not notice minor injuries to your feet that could lead to more serious problems. Noticing and addressing any potential problems early is the best way to prevent future foot problems. How to care for your feet Foot hygiene  Wash your feet daily with warm water and mild soap. Do not use hot water. Then, pat your feet and the areas between your toes until they are completely dry. Do not soak your feet as this can dry your skin.  Trim your toenails straight across. Do not dig under them or around the cuticle. File the edges of your nails with an emery board or nail file.  Apply a moisturizing lotion or petroleum jelly to the skin on your feet and to dry, brittle toenails. Use lotion that does not contain alcohol and is unscented. Do not apply lotion between your toes.   Shoes and socks  Wear clean socks or stockings every day. Make sure they are not too tight. Do not wear knee-high stockings since they may decrease blood flow to your legs.  Wear shoes that fit properly and have enough cushioning. Always look in your shoes before you put them on to be sure there are no objects inside.  To break in new shoes, wear them for just a few hours a day. This prevents injuries on your feet. Wounds, scrapes, corns, and calluses  Check your feet daily for blisters, cuts, bruises, sores, and redness. If you cannot see the bottom of your feet, use a mirror or ask someone for help.  Do not cut corns or calluses or try to remove them with medicine.  If you  find a minor scrape, cut, or break in the skin on your feet, keep it and the skin around it clean and dry. You may clean these areas with mild soap and water. Do not clean the area with peroxide, alcohol, or iodine.  If you have a wound, scrape, corn, or callus on your foot, look at it several times a day to make sure it is healing and not infected. Check for: ? Redness, swelling, or pain. ? Fluid or blood. ? Warmth. ? Pus or a bad smell.   General tips  Do not cross your legs. This may decrease blood flow to your feet.  Do not use heating pads or hot water bottles on your feet. They may burn your skin. If you have lost feeling in your feet or legs, you may not know this is happening until it is too late.  Protect your feet from hot and cold by wearing shoes, such as at the beach or on hot pavement.  Schedule a complete foot exam at least once a year (annually) or more often if you have foot problems. Report any cuts, sores, or bruises to your health care provider immediately. Where to find more information  American Diabetes Association: www.diabetes.org  Association of Diabetes Care & Education Specialists: www.diabeteseducator.org Contact a health care provider if:  You have a medical condition that increases your risk of infection and   you have any cuts, sores, or bruises on your feet.  You have an injury that is not healing.  You have redness on your legs or feet.  You feel burning or tingling in your legs or feet.  You have pain or cramps in your legs and feet.  Your legs or feet are numb.  Your feet always feel cold.  You have pain around any toenails. Get help right away if:  You have a wound, scrape, corn, or callus on your foot and: ? You have pain, swelling, or redness that gets worse. ? You have fluid or blood coming from the wound, scrape, corn, or callus. ? Your wound, scrape, corn, or callus feels warm to the touch. ? You have pus or a bad smell coming from  the wound, scrape, corn, or callus. ? You have a fever. ? You have a red line going up your leg. Summary  Check your feet every day for blisters, cuts, bruises, sores, and redness.  Apply a moisturizing lotion or petroleum jelly to the skin on your feet and to dry, brittle toenails.  Wear shoes that fit properly and have enough cushioning.  If you have foot problems, report any cuts, sores, or bruises to your health care provider immediately.  Schedule a complete foot exam at least once a year (annually) or more often if you have foot problems. This information is not intended to replace advice given to you by your health care provider. Make sure you discuss any questions you have with your health care provider. Document Revised: 06/07/2020 Document Reviewed: 06/07/2020 Elsevier Patient Education  2021 Elsevier Inc.  

## 2021-02-13 NOTE — Progress Notes (Signed)
I,[name],acting as a Education administrator for [name], MD.,have documented all relevant documentation on the behalf of [name], MD,as directed by  [name], MD while in the presence of [name], MD.  This visit occurred during the SARS-CoV-2 public health emergency.  Safety protocols were in place, including screening questions prior to the visit, additional usage of staff PPE, and extensive cleaning of exam room while observing appropriate contact time as indicated for disinfecting solutions.  Subjective:     Patient ID: Meagan [name] , female    DOB: [birth year] , 60 y.o.   MRN: [id number]   Chief Complaint  Patient presents with  . Diabetes  . Hypertension    HPI  The patient is here today for a diabetes/HTN f/u. She reports compliance with meds. Admits she is not yet exercising as much as she should. She denies headaches, chest pain and shortness of breath.  Diabetes She presents for her follow-up diabetic visit. She has type 2 diabetes mellitus. Her disease course has been improving. Pertinent negatives for hypoglycemia include no hunger. Pertinent negatives for diabetes include no blurred vision and no visual change. There are no hypoglycemic complications. Symptoms are improving. Diabetic complications include nephropathy. Risk factors for coronary artery disease include hypertension, obesity and diabetes mellitus. Current diabetic treatment includes insulin injections and oral agent (monotherapy). She is compliant with treatment most of the time. She is following a generally healthy diet. She has not had a previous visit with a dietitian. She participates in exercise intermittently. Her breakfast blood glucose is taken between 9-10 am. Her breakfast blood glucose range is generally 110-130 mg/dl. She does not see a podiatrist.Eye exam is current.  Hypertension This is a chronic problem. The current episode started more than 1 year ago. Pertinent negatives  include no blurred vision. Risk factors for coronary artery disease include diabetes mellitus, dyslipidemia, obesity, post-menopausal state and sedentary lifestyle. Past treatments include ACE inhibitors. Compliance problems include exercise.      Past Medical History:  Diagnosis Date  . Diabetes mellitus ([place])   . GERD (gastroesophageal reflux disease)   . Hyperlipidemia   . Hypertension      Family History  Problem Relation Age of Onset  . Kidney failure Mother   . Liver disease Mother   . Early death Father   . Hypertension Brother   . Colon cancer Neg Hx   . Stomach cancer Neg Hx      Current Outpatient Medications:  .  amLODipine (NORVASC) 5 MG tablet, TAKE 1 TABLET BY MOUTH EVERY DAY, Disp: 90 tablet, Rfl: 1 .  atorvastatin (LIPITOR) 10 MG tablet, Take 1 tablet (10 mg total) by mouth daily., Disp: 90 tablet, Rfl: 1 .  Cholecalciferol (DIALYVITE VITAMIN D 5000 PO), Take 2 capsules by mouth daily., Disp: , Rfl:  .  empagliflozin (JARDIANCE) 10 MG TABS tablet, Take 1 tablet (10 mg total) by mouth every morning., Disp: 90 tablet, Rfl: 1 .  glucose blood (ONETOUCH VERIO) test strip, USE TO CHECK BLOOD SUGARS TWICE A DAY  DX:E11.65, Disp: 100 each, Rfl: 12 .  insulin aspart (NOVOLOG FLEXPEN) 100 UNIT/ML FlexPen, Inject 12 units 3 times per day with meals not to exceed 50 units, Disp: 15 pen, Rfl: 1 .  Insulin Pen Needle (B-D ULTRAFINE III SHORT PEN) 31G X 8 MM MISC, Use as directed with insulin pen, Disp: 100 each, Rfl: 3 .  JANUMET 50-1000 MG tablet, TAKE 1 TABLET BY MOUTH TWICE A DAY,  Disp: 180 tablet, Rfl: 1 .  Multiple Vitamins-Minerals (MULTIVITAMIN WITH MINERALS) tablet, Take 1 tablet by mouth daily., Disp: , Rfl:  .  PRILOSEC OTC 20 MG tablet, Take 20 mg by mouth daily. , Disp: , Rfl:  .  sertraline (ZOLOFT) 50 MG tablet, Take 1 tablet (50 mg total) by mouth daily., Disp: 90 tablet, Rfl: 0 .  TRESIBA FLEXTOUCH 200 UNIT/ML FlexTouch Pen, INJECT 66 UNITS SQ DAILY, Disp: 12 mL,  Rfl: 1 .  albuterol (PROAIR HFA) 108 (90 Base) MCG/ACT inhaler, Inhale 1-2 puffs into the lungs every 6 (six) hours as needed for wheezing or shortness of breath. (Patient not taking: Reported on 02/13/2021), Disp: 1 Inhaler, Rfl: 0 .  benzonatate (TESSALON PERLES) 100 MG capsule, Take 1 capsule (100 mg total) by mouth 3 (three) times daily as needed for cough., Disp: 30 capsule, Rfl: 0 .  celecoxib (CELEBREX) 100 MG capsule, Take 100 mg by mouth 2 (two) times daily., Disp: , Rfl:  .  gabapentin (NEURONTIN) 100 MG capsule, Take 100 mg by mouth 3 (three) times daily., Disp: , Rfl:  .  Influenza Virus Vaccine Split SUSP, Flulaval 2013-2014 45 mcg (15 mcg x 3)/0.5 mL intramuscular suspension, Disp: , Rfl:  .  MAGNESIUM PO, Take by mouth. Take 4 tablets d (Patient not taking: Reported on 02/13/2021), Disp: , Rfl:    Allergies  Allergen Reactions  . Lisinopril Itching and Swelling  . Phenothiazines     Bells palsy     Review of Systems  Constitutional: Negative.   Eyes: Negative for blurred vision.  Respiratory: Negative.   Cardiovascular: Negative.   Gastrointestinal: Negative.   Psychiatric/Behavioral: Negative.   All other systems reviewed and are negative.    Today's Vitals   02/13/21 0943  BP: 126/88  Pulse: 74  Temp: 98.7 F (37.1 C)  TempSrc: Oral  Weight: 183 lb (83 kg)  Height: 5' 2.4" (1.585 m)   Body mass index is 33.04 kg/m.  Wt Readings from Last 3 Encounters:  02/13/21 183 lb (83 kg)  01/16/21 184 lb (83.5 kg)  10/24/20 191 lb 6.4 oz (86.8 kg)   Objective:  Physical Exam Vitals and nursing note reviewed.  Constitutional:      Appearance: Normal appearance. She is obese.  HENT:     Head: Normocephalic and atraumatic.     Nose:     Comments: Masked        Mouth/Throat:     Comments: Masked  Eyes:     Extraocular Movements: Extraocular movements intact.  Cardiovascular:     Rate and Rhythm: Normal rate and regular rhythm.     Heart sounds: Normal heart  sounds.  Pulmonary:     Effort: Pulmonary effort is normal.     Breath sounds: Normal breath sounds.  Musculoskeletal:     Cervical back: Normal range of motion.  Skin:    General: Skin is warm.  Neurological:     General: No focal deficit present.     Mental Status: She is alert and oriented to person, place, and time.         Assessment And Plan:     1. Type 2 diabetes mellitus with stage 2 chronic kidney disease, with long-term current use of insulin (HCC) Comments: Chronic, I will check labs as listed below. Medication, dietary and exercise compliance were d/w patient. She will f/u 3 months for re-evaluation.  - BMP8+EGFR - Hemoglobin A1c  2. Benign hypertensive renal disease Comments: Chronic, fair control  She is aware goal BP is less than 130/80.  She is encouraged to follow low sodium diet.   3. Class 1 obesity due to excess calories with serious comorbidity and body mass index (BMI) of 33.0 to 33.9 in adult Comments:  She is encouraged to strive for BMI less than 30 to decrease cardiac risk. Advised to aim for at least 150 minutes of exercise per week.  4. Immunization due - Pneumococcal polysaccharide vaccine 23-valent greater than or equal to 2yo subcutaneous/IM  Patient was given opportunity to ask questions. Patient verbalized understanding of the plan and was able to repeat key elements of the plan. All questions were answered to their satisfaction.   I, [name], MD, have reviewed all documentation for this visit. The documentation on 02/16/21 for the exam, diagnosis, procedures, and orders are all accurate and complete.   IF YOU HAVE BEEN REFERRED TO A SPECIALIST, IT MAY TAKE 1-2 WEEKS TO SCHEDULE/PROCESS THE REFERRAL. IF YOU HAVE NOT HEARD FROM US/SPECIALIST IN TWO WEEKS, PLEASE GIVE [place] A CALL AT [phone].   THE PATIENT IS ENCOURAGED TO PRACTICE SOCIAL DISTANCING DUE TO THE COVID-19 PANDEMIC.

## 2021-02-14 LAB — BMP8+EGFR
BUN/Creatinine Ratio: 22 (ref 9–23)
BUN: 18 mg/dL (ref 6–24)
CO2: 20 mmol/L (ref 20–29)
Calcium: 9.2 mg/dL (ref 8.7–10.2)
Chloride: 103 mmol/L (ref 96–106)
Creatinine, Ser: 0.81 mg/dL (ref 0.57–1.00)
Glucose: 101 mg/dL — ABNORMAL HIGH (ref 65–99)
Potassium: 4.8 mmol/L (ref 3.5–5.2)
Sodium: 140 mmol/L (ref 134–144)
eGFR: 84 mL/min/{1.73_m2} (ref >59)

## 2021-02-14 LAB — HEMOGLOBIN A1C
Est. average glucose Bld gHb Est-mCnc: 171 mg/dL
Hgb A1c MFr Bld: 7.6 % — ABNORMAL HIGH (ref 4.8–5.6)

## 2021-02-19 ENCOUNTER — Encounter: Payer: Self-pay | Admitting: Nurse Practitioner

## 2021-02-27 ENCOUNTER — Ambulatory Visit: Payer: BC Managed Care – PPO | Admitting: Internal Medicine

## 2021-03-15 ENCOUNTER — Other Ambulatory Visit: Payer: Self-pay | Admitting: Internal Medicine

## 2021-03-27 ENCOUNTER — Encounter: Payer: Self-pay | Admitting: Internal Medicine

## 2021-04-09 ENCOUNTER — Other Ambulatory Visit: Payer: Self-pay

## 2021-04-09 MED ORDER — ONETOUCH VERIO FLEX SYSTEM W/DEVICE KIT: PACK | 1 refills | Status: AC

## 2021-04-09 NOTE — Telephone Encounter (Signed)
I left the pt a message that a prescription for a glucose monitor kit has been faxed to her pharmacy.

## 2021-04-16 ENCOUNTER — Other Ambulatory Visit: Payer: Self-pay | Admitting: Internal Medicine

## 2021-04-16 DIAGNOSIS — F419 Anxiety disorder, unspecified: Secondary | ICD-10-CM

## 2021-04-24 ENCOUNTER — Ambulatory Visit (INDEPENDENT_AMBULATORY_CARE_PROVIDER_SITE_OTHER): Payer: BC Managed Care – PPO | Admitting: Nurse Practitioner

## 2021-04-24 ENCOUNTER — Encounter: Payer: Self-pay | Admitting: Nurse Practitioner

## 2021-04-24 ENCOUNTER — Other Ambulatory Visit: Payer: Self-pay

## 2021-04-24 VITALS — BP 134/80 | HR 61 | Temp 98.3°F | Ht 61.8 in | Wt 182.0 lb

## 2021-04-24 DIAGNOSIS — L03012 Cellulitis of left finger: Secondary | ICD-10-CM | POA: Diagnosis not present

## 2021-04-24 DIAGNOSIS — N1832 Chronic kidney disease, stage 3b: Secondary | ICD-10-CM

## 2021-04-24 DIAGNOSIS — E1122 Type 2 diabetes mellitus with diabetic chronic kidney disease: Secondary | ICD-10-CM | POA: Diagnosis not present

## 2021-04-24 DIAGNOSIS — Z794 Long term (current) use of insulin: Secondary | ICD-10-CM | POA: Diagnosis not present

## 2021-04-24 LAB — CBC
Hematocrit: 30.5 % — ABNORMAL LOW (ref 34.0–46.6)
Hemoglobin: 9.2 g/dL — ABNORMAL LOW (ref 11.1–15.9)
MCH: 21.4 pg — ABNORMAL LOW (ref 26.6–33.0)
MCHC: 30.2 g/dL — ABNORMAL LOW (ref 31.5–35.7)
MCV: 71 fL — ABNORMAL LOW (ref 79–97)
Platelets: 602 10*3/uL — ABNORMAL HIGH (ref 150–450)
RBC: 4.3 x10E6/uL (ref 3.77–5.28)
RDW: 19.3 % — ABNORMAL HIGH (ref 11.7–15.4)
WBC: 9.3 10*3/uL (ref 3.4–10.8)

## 2021-04-24 MED ORDER — CEPHALEXIN 500 MG PO CAPS: 500.0000 mg | ORAL_CAPSULE | Freq: Four times a day (QID) | ORAL | 0 refills | Status: AC

## 2021-04-24 NOTE — Patient Instructions (Signed)
Diabetes Mellitus Basics  Diabetes mellitus, or diabetes, is a long-term (chronic) disease. It occurs when the body does not properly use sugar (glucose) that is released from food after you eat. Diabetes mellitus may be caused by one or both of these problems:  Your pancreas does not make enough of a hormone called insulin.  Your body does not react in a normal way to the insulin that it makes. Insulin lets glucose enter cells in your body. This gives you energy. If you have diabetes, glucose cannot get into cells. This causes high blood glucose (hyperglycemia). How to treat and manage diabetes You may need to take insulin or other diabetes medicines daily to keep your glucose in balance. If you are prescribed insulin, you will learn how to give yourself insulin by injection. You may need to adjust the amount of insulin you take based on the foods that you eat. You will need to check your blood glucose levels using a glucose monitor as told by your health care provider. The readings can help determine if you have low or high blood glucose. Generally, you should have these blood glucose levels:  Before meals (preprandial): 80-130 mg/dL (4.4-7.2 mmol/L).  After meals (postprandial): below 180 mg/dL (10 mmol/L).  Hemoglobin A1c (HbA1c) level: less than 7%. Your health care provider will set treatment goals for you. Keep all follow-up visits. This is important. Follow these instructions at home: Diabetes medicines Take your diabetes medicines every day as told by your health care provider. List your diabetes medicines here:  Name of medicine: ______________________________ ? Amount (dose): _______________ Time (a.m./p.m.): _______________ Notes: ___________________________________  Name of medicine: ______________________________ ? Amount (dose): _______________ Time (a.m./p.m.): _______________ Notes: ___________________________________  Name of medicine:  ______________________________ ? Amount (dose): _______________ Time (a.m./p.m.): _______________ Notes: ___________________________________ Insulin If you use insulin, list the types of insulin you use here:  Insulin type: ______________________________ ? Amount (dose): _______________ Time (a.m./p.m.): _______________Notes: ___________________________________  Insulin type: ______________________________ ? Amount (dose): _______________ Time (a.m./p.m.): _______________ Notes: ___________________________________  Insulin type: ______________________________ ? Amount (dose): _______________ Time (a.m./p.m.): _______________ Notes: ___________________________________  Insulin type: ______________________________ ? Amount (dose): _______________ Time (a.m./p.m.): _______________ Notes: ___________________________________  Insulin type: ______________________________ ? Amount (dose): _______________ Time (a.m./p.m.): _______________ Notes: ___________________________________ Managing blood glucose Check your blood glucose levels using a glucose monitor as told by your health care provider. Write down the times that you check your glucose levels here:  Time: _______________ Notes: ___________________________________  Time: _______________ Notes: ___________________________________  Time: _______________ Notes: ___________________________________  Time: _______________ Notes: ___________________________________  Time: _______________ Notes: ___________________________________  Time: _______________ Notes: ___________________________________   Low blood glucose Low blood glucose (hypoglycemia) is when glucose is at or below 70 mg/dL (3.9 mmol/L). Symptoms may include:  Feeling: ? Hungry. ? Sweaty and clammy. ? Irritable or easily upset. ? Dizzy. ? Sleepy.  Having: ? A fast heartbeat. ? A headache. ? A change in your vision. ? Numbness around the mouth, lips, or  tongue.  Having trouble with: ? Moving (coordination). ? Sleeping. Treating low blood glucose To treat low blood glucose, eat or drink something containing sugar right away. If you can think clearly and swallow safely, follow the 15:15 rule:  Take 15 grams of a fast-acting carb (carbohydrate), as told by your health care provider.  Some fast-acting carbs are: ? Glucose tablets: take 3-4 tablets. ? Hard candy: eat 3-5 pieces. ? Fruit juice: drink 4 oz (120 mL). ? Regular (not diet) soda: drink 4-6 oz (120-180 mL). ? Honey or sugar:   eat 1 Tbsp (15 mL).  Check your blood glucose levels 15 minutes after you take the carb.  If your glucose is still at or below 70 mg/dL (3.9 mmol/L), take 15 grams of a carb again.  If your glucose does not go above 70 mg/dL (3.9 mmol/L) after 3 tries, get help right away.  After your glucose goes back to normal, eat a meal or a snack within 1 hour. Treating very low blood glucose If your glucose is at or below 54 mg/dL (3 mmol/L), you have very low blood glucose (severe hypoglycemia). This is an emergency. Do not wait to see if the symptoms will go away. Get medical help right away. Call your local emergency services (911 in the [place] Do not drive yourself to the hospital. Questions to ask your health care provider  Should I talk with a diabetes educator?  What equipment will I need to care for myself at home?  What diabetes medicines do I need? When should I take them?  How often do I need to check my blood glucose levels?  What number can I call if I have questions?  When is my follow-up visit?  Where can I find a support group for people with diabetes? Where to find more information  American Diabetes Association: www.diabetes.org  Association of Diabetes Care and Education Specialists: www.diabeteseducator.org Contact a health care provider if:  Your blood glucose is at or above 240 mg/dL (13.3 mmol/L) for 2 days in a row.  You have  been sick or have had a fever for 2 days or more, and you are not getting better.  You have any of these problems for more than 6 hours: ? You cannot eat or drink. ? You feel nauseous. ? You vomit. ? You have diarrhea. Get help right away if:  Your blood glucose is lower than 54 mg/dL (3 mmol/L).  You get confused.  You have trouble thinking clearly.  You have trouble breathing. These symptoms may represent a serious problem that is an emergency. Do not wait to see if the symptoms will go away. Get medical help right away. Call your local emergency services (911 in the [place] Do not drive yourself to the hospital. Summary  Diabetes mellitus is a chronic disease that occurs when the body does not properly use sugar (glucose) that is released from food after you eat.  Take insulin and diabetes medicines as told.  Check your blood glucose every day, as often as told.  Keep all follow-up visits. This is important. This information is not intended to replace advice given to you by your health care provider. Make sure you discuss any questions you have with your health care provider. Document Revised: 03/20/2020 Document Reviewed: 03/20/2020 Elsevier Patient Education  2021 [employer]  

## 2021-04-24 NOTE — Progress Notes (Signed)
I,Yamilka [employer] as a Education administrator for [employer], NP.,have documented all relevant documentation on the behalf of [employer], NP,as directed by  [name], NP while in the presence of [name], NP. This visit occurred during the SARS-CoV-2 public health emergency.  Safety protocols were in place, including screening questions prior to the visit, additional usage of staff PPE, and extensive cleaning of exam room while observing appropriate contact time as indicated for disinfecting solutions.  Subjective:     Patient ID: Meagan [name] , female    DOB: [birth year] , 60 y.o.   MRN: [id number]   Chief Complaint  Patient presents with  . Diabetes    HPI  Patient is here for cellulitis of her hand. She has an infection on her left 2nd hand. Her And also 4th finger is infected as well. She recently had gone to the nail shop and got a manicure and the infection started after that on one of her finger. She was on antx but not sure which one. Called the office where she got the antx and she was treated with doxycycline and bactrim as well a topical cream. Since then the infection has not gotten better. She does have an appt with the hand specialist in June. She has not had any fever that she knows of. She is concerned because she recently had hand surgery for her carpel tunnel and the wound started after that. She is also a diabetic and states that her blood sugar have been elevated more recently.    Diabetes She presents for her follow-up diabetic visit. She has type 2 diabetes mellitus. Her disease course has been improving. Pertinent negatives for hypoglycemia include no hunger or tremors. Pertinent negatives for diabetes include no blurred vision, no chest pain and no visual change. There are no hypoglycemic complications. Symptoms are improving. Diabetic complications include nephropathy. Risk factors for coronary artery disease include hypertension, obesity  and diabetes mellitus. Current diabetic treatment includes insulin injections and oral agent (monotherapy). She is compliant with treatment most of the time. She is following a generally healthy diet. She has not had a previous visit with a dietitian. She participates in exercise intermittently. Her breakfast blood glucose is taken between 9-10 am. Her breakfast blood glucose range is generally 110-130 mg/dl. She does not see a podiatrist.Eye exam is current.  Hypertension This is a chronic problem. The current episode started more than 1 year ago. Pertinent negatives include no blurred vision, chest pain, palpitations or shortness of breath. Risk factors for coronary artery disease include diabetes mellitus, dyslipidemia, obesity, post-menopausal state and sedentary lifestyle. Past treatments include ACE inhibitors. Compliance problems include exercise.   Hand Pain  Injury mechanism: cut on patient's finger. The pain is present in the left hand and right hand. The quality of the pain is described as aching, burning, cramping, shooting and stabbing. The pain is at a severity of 10/10. The pain is severe. The pain has been constant since the incident. Associated symptoms include numbness. Pertinent negatives include no chest pain. She has tried acetaminophen (also takes gabapentin) for the symptoms. The treatment provided mild relief.     Past Medical History:  Diagnosis Date  . Diabetes mellitus ([place])   . GERD (gastroesophageal reflux disease)   . Hyperlipidemia   . Hypertension      Family History  Problem Relation Age of Onset  . Kidney failure Mother   . Liver disease Mother   . Early death Father   . Hypertension  Brother   . Colon cancer Neg Hx   . Stomach cancer Neg Hx      Current Outpatient Medications:  .  albuterol (PROAIR HFA) 108 (90 Base) MCG/ACT inhaler, Inhale 1-2 puffs into the lungs every 6 (six) hours as needed for wheezing or shortness of breath., Disp: 1 Inhaler, Rfl:  0 .  amLODipine (NORVASC) 5 MG tablet, TAKE 1 TABLET BY MOUTH EVERY DAY, Disp: 90 tablet, Rfl: 1 .  atorvastatin (LIPITOR) 10 MG tablet, Take 1 tablet (10 mg total) by mouth daily., Disp: 90 tablet, Rfl: 1 .  benzonatate (TESSALON PERLES) 100 MG capsule, Take 1 capsule (100 mg total) by mouth 3 (three) times daily as needed for cough., Disp: 30 capsule, Rfl: 0 .  Blood Glucose Monitoring Suppl (ONETOUCH VERIO FLEX SYSTEM) w/Device KIT, Use as directed to check blood sugars 2 times per day dx: e11.65, Disp: 1 kit, Rfl: 1 .  cephALEXin (KEFLEX) 500 MG capsule, Take 1 capsule (500 mg total) by mouth 4 (four) times daily for 10 days., Disp: 40 capsule, Rfl: 0 .  Cholecalciferol (DIALYVITE VITAMIN D 5000 PO), Take 2 capsules by mouth daily., Disp: , Rfl:  .  empagliflozin (JARDIANCE) 10 MG TABS tablet, Take 1 tablet (10 mg total) by mouth every morning., Disp: 90 tablet, Rfl: 1 .  gabapentin (NEURONTIN) 100 MG capsule, Take 100 mg by mouth 3 (three) times daily., Disp: , Rfl:  .  glucose blood (ONETOUCH VERIO) test strip, USE TO CHECK BLOOD SUGARS TWICE A DAY  DX:E11.65, Disp: 100 each, Rfl: 12 .  insulin aspart (NOVOLOG FLEXPEN) 100 UNIT/ML FlexPen, Inject 12 units 3 times per day with meals not to exceed 50 units, Disp: 15 pen, Rfl: 1 .  Insulin Pen Needle (B-D ULTRAFINE III SHORT PEN) 31G X 8 MM MISC, Use as directed with insulin pen, Disp: 100 each, Rfl: 3 .  JANUMET 50-1000 MG tablet, TAKE 1 TABLET BY MOUTH TWICE A DAY, Disp: 180 tablet, Rfl: 1 .  MAGNESIUM PO, Take by mouth. Take 4 tablets d, Disp: , Rfl:  .  Multiple Vitamins-Minerals (MULTIVITAMIN WITH MINERALS) tablet, Take 1 tablet by mouth daily., Disp: , Rfl:  .  PRILOSEC OTC 20 MG tablet, Take 20 mg by mouth daily. , Disp: , Rfl:  .  sertraline (ZOLOFT) 50 MG tablet, TAKE 1 TABLET BY MOUTH EVERY DAY, Disp: 90 tablet, Rfl: 0 .  TRESIBA FLEXTOUCH 200 UNIT/ML FlexTouch Pen, INJECT 66 UNITS SQ DAILY, Disp: 9 mL, Rfl: 1 .  Influenza Virus  Vaccine Split SUSP, Flulaval 2013-2014 45 mcg (15 mcg x 3)/0.5 mL intramuscular suspension (Patient not taking: Reported on 04/24/2021), Disp: , Rfl:    Allergies  Allergen Reactions  . Lisinopril Itching and Swelling  . Phenothiazines     Bells palsy     Review of Systems  Constitutional: Negative for chills and fever.  HENT: Negative for congestion, rhinorrhea, sinus pressure and sinus pain.   Eyes: Negative for blurred vision.  Respiratory: Negative for chest tightness, shortness of breath and wheezing.   Cardiovascular: Negative for chest pain and palpitations.  Gastrointestinal: Negative for constipation and diarrhea.  Musculoskeletal: Negative for arthralgias.  Skin: Positive for wound.  Neurological: Positive for numbness. Negative for tremors.     Today's Vitals   04/24/21 1144  BP: 134/80  Pulse: 61  Temp: 98.3 F (36.8 C)  Weight: 182 lb (82.6 kg)  Height: 5' 1.8" (1.57 m)  PainSc: 10-Worst pain ever   Body mass index  is 33.5 kg/m.   Objective:  Physical Exam Constitutional:      Appearance: Normal appearance. She is obese.  Cardiovascular:     Rate and Rhythm: Normal rate and regular rhythm.     Pulses: Normal pulses.     Heart sounds: Normal heart sounds. No murmur heard.   Pulmonary:     Effort: Pulmonary effort is normal. No respiratory distress.     Breath sounds: Normal breath sounds. No wheezing.  Musculoskeletal:       Hands:     Comments: Both wounds are nonpurulent and tender to touch. Swollen at both wound sites.    Skin:    General: Skin is warm and dry.     Capillary Refill: Capillary refill takes less than 2 seconds.  Neurological:     Mental Status: She is alert and oriented to person, place, and time.  Psychiatric:        Mood and Affect: Mood normal.        Behavior: Behavior normal.        Thought Content: Thought content normal.        Judgment: Judgment normal.         Assessment And Plan:     1. Cellulitis of finger of  left hand - AMB referral to wound care center - CBC no Diff - cephALEXin (KEFLEX) 500 MG capsule; Take 1 capsule (500 mg total) by mouth 4 (four) times daily for 10 days.  Dispense: 40 capsule; Refill: 0 -The patient has already been on doxycycline and Bactrim as per patient.   -The patient was advised to call the hand specialist and make her appt with them sooner if she was able to do so.  -Advised patient if she develops fever or if the area gets any worse with swelling/reddness to go to the nearest urgent care   Side effects and appropriate use of all the medication(s) were discussed with the patient today. Patient advised to use the medication(s) as directed by their healthcare provider. The patient was encouraged to read, review, and understand all associated package inserts and contact our office with any questions or concerns. The patient accepts the risks of the treatment plan and had an opportunity to ask questions.   The patient was encouraged to call or send a message through [facility] for any questions or concerns.   Follow up: if symptoms persist or do not get better.   Patient was given opportunity to ask questions. Patient verbalized understanding of the plan and was able to repeat key elements of the plan. All questions were answered to their satisfaction.  Raman Nova Schmuhl, DNP   I, Raman Jayleen Afonso have reviewed all documentation for this visit. The documentation on 04/24/21 for the exam, diagnosis, procedures, and orders are all accurate and complete.    IF YOU HAVE BEEN REFERRED TO A SPECIALIST, IT MAY TAKE 1-2 WEEKS TO SCHEDULE/PROCESS THE REFERRAL. IF YOU HAVE NOT HEARD FROM US/SPECIALIST IN TWO WEEKS, PLEASE GIVE [place] A CALL AT [phone].   THE PATIENT IS ENCOURAGED TO PRACTICE SOCIAL DISTANCING DUE TO THE COVID-19 PANDEMIC.

## 2021-04-25 ENCOUNTER — Other Ambulatory Visit: Payer: No Typology Code available for payment source

## 2021-04-25 ENCOUNTER — Other Ambulatory Visit: Payer: Self-pay | Admitting: Nurse Practitioner

## 2021-04-25 DIAGNOSIS — D649 Anemia, unspecified: Secondary | ICD-10-CM

## 2021-04-26 LAB — IRON,TIBC AND FERRITIN PANEL
Ferritin: 27 ng/mL (ref 15–150)
Iron Saturation: 4 % — CL (ref 15–55)
Iron: 14 ug/dL — ABNORMAL LOW (ref 27–159)
Total Iron Binding Capacity: 314 ug/dL (ref 250–450)
UIBC: 300 ug/dL (ref 131–425)

## 2021-04-30 ENCOUNTER — Other Ambulatory Visit: Payer: Self-pay | Admitting: Nurse Practitioner

## 2021-04-30 MED ORDER — FERROUS SULFATE 325 (65 FE) MG PO TABS: 325.0000 mg | ORAL_TABLET | Freq: Every day | ORAL | 3 refills | Status: DC

## 2021-05-15 ENCOUNTER — Other Ambulatory Visit: Payer: Self-pay

## 2021-05-18 ENCOUNTER — Other Ambulatory Visit: Payer: Self-pay | Admitting: Internal Medicine

## 2021-05-21 ENCOUNTER — Encounter (HOSPITAL_BASED_OUTPATIENT_CLINIC_OR_DEPARTMENT_OTHER): Payer: No Typology Code available for payment source | Admitting: Internal Medicine

## 2021-06-07 ENCOUNTER — Other Ambulatory Visit: Payer: Self-pay | Admitting: Internal Medicine

## 2021-06-18 ENCOUNTER — Other Ambulatory Visit: Payer: Self-pay

## 2021-06-18 ENCOUNTER — Encounter: Payer: Self-pay | Admitting: Internal Medicine

## 2021-06-18 ENCOUNTER — Ambulatory Visit (INDEPENDENT_AMBULATORY_CARE_PROVIDER_SITE_OTHER): Payer: BC Managed Care – PPO | Admitting: Internal Medicine

## 2021-06-18 VITALS — BP 110/80 | HR 103 | Temp 98.5°F | Ht 61.8 in | Wt 177.2 lb

## 2021-06-18 DIAGNOSIS — M79642 Pain in left hand: Secondary | ICD-10-CM

## 2021-06-18 DIAGNOSIS — Z6832 Body mass index (BMI) 32.0-32.9, adult: Secondary | ICD-10-CM

## 2021-06-18 DIAGNOSIS — Z794 Long term (current) use of insulin: Secondary | ICD-10-CM

## 2021-06-18 DIAGNOSIS — I129 Hypertensive chronic kidney disease with stage 1 through stage 4 chronic kidney disease, or unspecified chronic kidney disease: Secondary | ICD-10-CM

## 2021-06-18 DIAGNOSIS — R0989 Other specified symptoms and signs involving the circulatory and respiratory systems: Secondary | ICD-10-CM

## 2021-06-18 DIAGNOSIS — Z0000 Encounter for general adult medical examination without abnormal findings: Secondary | ICD-10-CM

## 2021-06-18 DIAGNOSIS — E6609 Other obesity due to excess calories: Secondary | ICD-10-CM

## 2021-06-18 DIAGNOSIS — Z23 Encounter for immunization: Secondary | ICD-10-CM

## 2021-06-18 DIAGNOSIS — N182 Chronic kidney disease, stage 2 (mild): Secondary | ICD-10-CM

## 2021-06-18 DIAGNOSIS — Z0001 Encounter for general adult medical examination with abnormal findings: Secondary | ICD-10-CM | POA: Diagnosis not present

## 2021-06-18 DIAGNOSIS — E1122 Type 2 diabetes mellitus with diabetic chronic kidney disease: Secondary | ICD-10-CM | POA: Diagnosis not present

## 2021-06-18 DIAGNOSIS — D509 Iron deficiency anemia, unspecified: Secondary | ICD-10-CM

## 2021-06-18 LAB — POCT URINALYSIS DIPSTICK
Bilirubin, UA: NEGATIVE
Blood, UA: NEGATIVE
Glucose, UA: NEGATIVE
Ketones, UA: NEGATIVE
Leukocytes, UA: NEGATIVE
Nitrite, UA: NEGATIVE
Protein, UA: POSITIVE — AB
Spec Grav, UA: 1.025 (ref 1.010–1.025)
Urobilinogen, UA: NEGATIVE E.U./dL — AB
pH, UA: 5.5 (ref 5.0–8.0)

## 2021-06-18 LAB — POCT UA - MICROALBUMIN
Creatinine, POC: 300 mg/dL
Microalbumin Ur, POC: 150 mg/L

## 2021-06-18 MED ORDER — GABAPENTIN 300 MG PO CAPS: 300.0000 mg | ORAL_CAPSULE | Freq: Three times a day (TID) | ORAL | 2 refills | Status: DC

## 2021-06-18 MED ORDER — SHINGRIX 50 MCG/0.5ML IM SUSR: 0.5000 mL | Freq: Once | INTRAMUSCULAR | 0 refills | Status: AC

## 2021-06-18 MED ORDER — KETOROLAC TROMETHAMINE 60 MG/2ML IM SOLN
60.0000 mg | Freq: Once | INTRAMUSCULAR | Status: AC
  Administered 2021-06-18: 60 mg via INTRAMUSCULAR

## 2021-06-18 NOTE — Progress Notes (Signed)
Meagan [name],acting as a Education administrator for Meagan Greenland, MD.,have documented all relevant documentation on the behalf of Meagan Greenland, MD,as directed by  Meagan Greenland, MD while in the presence of Meagan Greenland, MD.  This visit occurred during the SARS-CoV-2 public health emergency.  Safety protocols were in place, including screening questions prior to the visit, additional usage of staff PPE, and extensive cleaning of exam room while observing appropriate contact time as indicated for disinfecting solutions.  Subjective:     Patient ID: Meagan [name] , female    DOB: 04-03-1961 , 60 y.o.   MRN: [id number]   Chief Complaint  Patient presents with   Annual Exam   Diabetes   Hypertension     HPI  The patient is here today for a physical examination. The patient is followed by Dr. Everett Graff and her pap was last done earlier this year. She reports compliance with meds, but admits that she is not exercise as much as she should.   Diabetes She presents for her follow-up diabetic visit. She has type 2 diabetes mellitus. Her disease course has been stable. There are no hypoglycemic associated symptoms. There are no diabetic associated symptoms. Pertinent negatives for diabetes include no blurred vision, no chest pain, no fatigue and no visual change. There are no hypoglycemic complications. Symptoms are stable. There are no diabetic complications. Risk factors for coronary artery disease include hypertension, obesity and diabetes mellitus. Current diabetic treatment includes oral agent (monotherapy). She is compliant with treatment all of the time. She has not had a previous visit with a dietitian. She participates in exercise intermittently. Her breakfast blood glucose is taken between 9-10 am. Her breakfast blood glucose range is generally 110-130 mg/dl. (Blood sugar averaging 130-140 was running in 90's) She does not see a podiatrist.Eye exam is current.  Hypertension This is a  chronic problem. The current episode started more than 1 year ago. Pertinent negatives include no blurred vision or chest pain. Risk factors for coronary artery disease include diabetes mellitus, dyslipidemia, obesity, post-menopausal state and sedentary lifestyle. Past treatments include ACE inhibitors. Compliance problems include exercise.     Past Medical History:  Diagnosis Date   Diabetes mellitus ([place])    GERD (gastroesophageal reflux disease)    Hyperlipidemia    Hypertension      Family History  Problem Relation Age of Onset   Kidney failure Mother    Liver disease Mother    Early death Father    Hypertension Brother    Colon cancer Neg Hx    Stomach cancer Neg Hx      Current Outpatient Medications:    atorvastatin (LIPITOR) 10 MG tablet, Take 1 tablet (10 mg total) by mouth daily., Disp: 90 tablet, Rfl: 1   Blood Glucose Monitoring Suppl (ONETOUCH VERIO FLEX SYSTEM) w/Device KIT, Use as directed to check blood sugars 2 times per day dx: e11.65, Disp: 1 kit, Rfl: 1   Cholecalciferol (DIALYVITE VITAMIN D 5000 PO), Take 2 capsules by mouth daily., Disp: , Rfl:    ferrous sulfate 325 (65 FE) MG tablet, Take 1 tablet (325 mg total) by mouth daily with breakfast., Disp: 30 tablet, Rfl: 3   gabapentin (NEURONTIN) 300 MG capsule, Take 1 capsule (300 mg total) by mouth 3 (three) times daily., Disp: 90 capsule, Rfl: 2   glucose blood (ONETOUCH VERIO) test strip, USE TO CHECK BLOOD SUGARS TWICE A DAY  DX:E11.65, Disp: 100 each, Rfl: 12   insulin aspart (NOVOLOG  FLEXPEN) 100 UNIT/ML FlexPen, Inject 12 units 3 times per day with meals not to exceed 50 units, Disp: 15 pen, Rfl: 1   Insulin Pen Needle (B-D ULTRAFINE III SHORT PEN) 31G X 8 MM MISC, Use as directed with insulin pen, Disp: 100 each, Rfl: 3   JANUMET 50-1000 MG tablet, TAKE 1 TABLET BY MOUTH TWICE A DAY, Disp: 180 tablet, Rfl: 1   JARDIANCE 10 MG TABS tablet, TAKE 1 TABLET BY MOUTH EVERY DAY IN THE MORNING, Disp: 90 tablet,  Rfl: 1   Multiple Vitamins-Minerals (MULTIVITAMIN WITH MINERALS) tablet, Take 1 tablet by mouth daily., Disp: , Rfl:    PRILOSEC OTC 20 MG tablet, Take 20 mg by mouth daily. , Disp: , Rfl:    sertraline (ZOLOFT) 50 MG tablet, TAKE 1 TABLET BY MOUTH EVERY DAY, Disp: 90 tablet, Rfl: 0   TRESIBA FLEXTOUCH 200 UNIT/ML FlexTouch Pen, INJECT 66 UNITS SQ DAILY, Disp: 9 mL, Rfl: 1   albuterol (PROAIR HFA) 108 (90 Base) MCG/ACT inhaler, Inhale 1-2 puffs into the lungs every 6 (six) hours as needed for wheezing or shortness of breath. (Patient not taking: Reported on 06/18/2021), Disp: 1 Inhaler, Rfl: 0   amLODipine (NORVASC) 5 MG tablet, TAKE 1 TABLET BY MOUTH EVERY DAY, Disp: 90 tablet, Rfl: 1   MAGNESIUM PO, Take by mouth. Take 4 tablets d (Patient not taking: Reported on 06/18/2021), Disp: , Rfl:    Allergies  Allergen Reactions   Lisinopril Itching and Swelling   Phenothiazines     Bells palsy      The patient states she uses post menopausal status for birth control. Last LMP was No LMP recorded. Patient is postmenopausal.. Negative for Dysmenorrhea. Negative for: breast discharge, breast lump(s), breast pain and breast self exam. Associated symptoms include abnormal vaginal bleeding. Pertinent negatives include abnormal bleeding (hematology), anxiety, decreased libido, depression, difficulty falling sleep, dyspareunia, history of infertility, nocturia, sexual dysfunction, sleep disturbances, urinary incontinence, urinary urgency, vaginal discharge and vaginal itching. Diet regular.The patient states her exercise level is  intermittent.  . The patient's tobacco use is:  Social History   Tobacco Use  Smoking Status Never  Smokeless Tobacco Never  . She has been exposed to passive smoke. The patient's alcohol use is:  Social History   Substance and Sexual Activity  Alcohol Use Not Currently   Comment: occasional    Review of Systems  Constitutional: Negative.  Negative for fatigue.  HENT:  Negative.    Eyes: Negative.  Negative for blurred vision.  Respiratory: Negative.    Cardiovascular: Negative.  Negative for chest pain.  Gastrointestinal: Negative.   Endocrine: Negative.   Genitourinary: Negative.   Musculoskeletal: Negative.   Skin: Negative.   Allergic/Immunologic: Negative.   Neurological: Negative.   Hematological: Negative.   Psychiatric/Behavioral: Negative.      Today's Vitals   06/18/21 1042  BP: 110/80  Pulse: (!) 103  Temp: 98.5 F (36.9 C)  TempSrc: Oral  Weight: 177 lb 3.2 oz (80.4 kg)  Height: 5' 1.8" (1.57 m)  PainSc: 7   PainLoc: Hand   Body mass index is 32.62 kg/m.  Wt Readings from Last 3 Encounters:  06/18/21 177 lb 3.2 oz (80.4 kg)  04/24/21 182 lb (82.6 kg)  02/13/21 183 lb (83 kg)    BP Readings from Last 3 Encounters:  06/18/21 110/80  04/24/21 134/80  02/13/21 126/88    Objective:  Physical Exam Vitals and nursing note reviewed.  Constitutional:  Appearance: Normal appearance.  HENT:     Head: Normocephalic and atraumatic.     Right Ear: Tympanic membrane, ear canal and external ear normal.     Left Ear: Tympanic membrane, ear canal and external ear normal.     Nose:     Comments: Masked     Mouth/Throat:     Comments: Masked  Eyes:     Extraocular Movements: Extraocular movements intact.     Conjunctiva/sclera: Conjunctivae normal.     Pupils: Pupils are equal, [name], and reactive to light.  Cardiovascular:     Rate and Rhythm: Normal rate and regular rhythm.     Pulses:          Dorsalis pedis pulses are 1+ on the right side and 1+ on the left side.     Heart sounds: Normal heart sounds.  Pulmonary:     Effort: Pulmonary effort is normal.     Breath sounds: Normal breath sounds.  Chest:  Breasts:    Tanner Score is 5.     Right: Normal.     Left: Normal.  Abdominal:     General: Abdomen is flat. Bowel sounds are normal.     Palpations: Abdomen is soft.  Genitourinary:    Comments:  deferred Musculoskeletal:        General: Normal range of motion.     Cervical back: Normal range of motion and neck supple.     Comments: Scarring right hand/fingers   Feet:     Right foot:     Protective Sensation: 5 sites tested.  5 sites sensed.     Skin integrity: Dry skin present.     Toenail Condition: Right toenails are long.     Left foot:     Protective Sensation: 5 sites tested.  5 sites sensed.     Skin integrity: Dry skin present.     Toenail Condition: Left toenails are long.  Skin:    General: Skin is warm and dry.  Neurological:     General: No focal deficit present.     Mental Status: She is alert and oriented to person, place, and time.  Psychiatric:        Mood and Affect: Mood normal.        Behavior: Behavior normal.        Assessment And Plan:     1. Encounter for general adult medical examination w/o abnormal findings Comments: A full exam was performed. Importance of monthly self breast exams was discussed with the patient. PATIENT IS ADVISED TO GET 30-45 MINUTES REGULAR EXERCISE NO LESS THAN FOUR TO FIVE DAYS PER WEEK - BOTH WEIGHTBEARING EXERCISES AND AEROBIC ARE RECOMMENDED.  PATIENT IS ADVISED TO FOLLOW A HEALTHY DIET WITH AT LEAST SIX FRUITS/VEGGIES PER DAY, DECREASE INTAKE OF RED MEAT, AND TO INCREASE FISH INTAKE TO TWO DAYS PER WEEK.  MEATS/FISH SHOULD NOT BE FRIED, BAKED OR BROILED IS PREFERABLE.  IT IS ALSO IMPORTANT TO CUT BACK ON YOUR SUGAR INTAKE. PLEASE AVOID ANYTHING WITH ADDED SUGAR, CORN SYRUP OR OTHER SWEETENERS. IF YOU MUST USE A SWEETENER, YOU CAN TRY STEVIA. IT IS ALSO IMPORTANT TO AVOID ARTIFICIALLY SWEETENERS AND DIET BEVERAGES. LASTLY, I SUGGEST WEARING SPF 50 SUNSCREEN ON EXPOSED PARTS AND ESPECIALLY WHEN IN THE DIRECT SUNLIGHT FOR AN EXTENDED PERIOD OF TIME.  PLEASE AVOID FAST FOOD RESTAURANTS AND INCREASE YOUR WATER INTAKE.  - Hemoglobin A1c - CMP14+EGFR - Lipid panel - CBC  2. Type 2 diabetes mellitus with stage  2 chronic kidney  disease, with long-term current use of insulin ([place]) Comments: Diabetic foot exam was performed. I DISCUSSED WITH THE PATIENT AT LENGTH REGARDING THE GOALS OF GLYCEMIC CONTROL AND POSSIBLE LONG-TERM COMPLICATIONS.  I  ALSO STRESSED THE IMPORTANCE OF COMPLIANCE WITH HOME GLUCOSE MONITORING, DIETARY RESTRICTIONS INCLUDING AVOIDANCE OF SUGARY DRINKS/PROCESSED FOODS,  ALONG WITH REGULAR EXERCISE.  I  ALSO STRESSED THE IMPORTANCE OF ANNUAL EYE EXAMS, SELF FOOT CARE AND COMPLIANCE WITH OFFICE VISITS.  - POCT Urinalysis Dipstick (81002) - POCT UA - Microalbumin  3. Benign hypertensive renal disease Comments: Chronic, well controlled. EKG performed, ST w/o acute changes. She is encouraged to follow low sodium diet. She will f/u in 4 months for re-evaluation.  - EKG 12-Lead  4. Bilateral carotid bruits Comments: I will refer her for b/l carotid ultrasound.  - VAS US CAROTID; Future  5. Decreased pulses in feet She is encouraged to increase daily activity and follow a heart healthy diet.  - VAS [place] ABI WITH/WO TBI; Future  6. Iron deficiency anemia, unspecified iron deficiency anemia type Comments: I will check labs as listed below. Currently denies GI/gingival bleeding.  - Iron, TIBC and Ferritin Panel - Vitamin B12  7. Hand pain, left Comments: She is s/p carpal tunnel performed on 01/31/21. Unfortunately, she is still having severe pain. Mgmt as per Hand Surgery. She was given Toradol IM x 1 and rx gabapentin to use nightly  - ketorolac (TORADOL) injection 60 mg  8. Class 1 obesity due to excess calories with serious comorbidity and body mass index (BMI) of 32.0 to 32.9 in adult Comments: She is encouraged to strive for BMI less than 29 to decrease cardiac risk. Advised to aim for at least 150 minutes of exercise per week.   9. Immunization due I will send rx Shingrix to her local pharmacy.   Patient was given opportunity to ask questions. Patient verbalized understanding of the plan and was  able to repeat key elements of the plan. All questions were answered to their satisfaction.   I, Meagan Greenland, MD, have reviewed all documentation for this visit. The documentation on 06/25/21 for the exam, diagnosis, procedures, and orders are all accurate and complete.  THE PATIENT IS ENCOURAGED TO PRACTICE SOCIAL DISTANCING DUE TO THE COVID-19 PANDEMIC.

## 2021-06-19 LAB — LIPID PANEL
Chol/HDL Ratio: 3 ratio (ref 0.0–4.4)
Cholesterol, Total: 113 mg/dL (ref 100–199)
HDL: 38 mg/dL — ABNORMAL LOW (ref >39)
LDL Chol Calc (NIH): 57 mg/dL (ref 0–99)
Triglycerides: 94 mg/dL (ref 0–149)
VLDL Cholesterol Cal: 18 mg/dL (ref 5–40)

## 2021-06-19 LAB — CBC
Hematocrit: 32.2 % — ABNORMAL LOW (ref 34.0–46.6)
Hemoglobin: 9.4 g/dL — ABNORMAL LOW (ref 11.1–15.9)
MCH: 21.3 pg — ABNORMAL LOW (ref 26.6–33.0)
MCHC: 29.2 g/dL — ABNORMAL LOW (ref 31.5–35.7)
MCV: 73 fL — ABNORMAL LOW (ref 79–97)
Platelets: 659 10*3/uL — ABNORMAL HIGH (ref 150–450)
RBC: 4.42 x10E6/uL (ref 3.77–5.28)
RDW: 20.3 % — ABNORMAL HIGH (ref 11.7–15.4)
WBC: 9.9 10*3/uL (ref 3.4–10.8)

## 2021-06-19 LAB — CMP14+EGFR
ALT: 6 IU/L (ref 0–32)
AST: 8 IU/L (ref 0–40)
Albumin/Globulin Ratio: 1.1 — ABNORMAL LOW (ref 1.2–2.2)
Albumin: 3.5 g/dL — ABNORMAL LOW (ref 3.8–4.9)
Alkaline Phosphatase: 100 IU/L (ref 44–121)
BUN/Creatinine Ratio: 19 (ref 9–23)
BUN: 15 mg/dL (ref 6–24)
Bilirubin Total: <0.2 mg/dL (ref 0.0–1.2)
CO2: 18 mmol/L — ABNORMAL LOW (ref 20–29)
Calcium: 8.8 mg/dL (ref 8.7–10.2)
Chloride: 104 mmol/L (ref 96–106)
Creatinine, Ser: 0.77 mg/dL (ref 0.57–1.00)
Globulin, Total: 3.3 g/dL (ref 1.5–4.5)
Glucose: 94 mg/dL (ref 65–99)
Potassium: 5.6 mmol/L — ABNORMAL HIGH (ref 3.5–5.2)
Sodium: 135 mmol/L (ref 134–144)
Total Protein: 6.8 g/dL (ref 6.0–8.5)
eGFR: 89 mL/min/{1.73_m2} (ref >59)

## 2021-06-19 LAB — IRON,TIBC AND FERRITIN PANEL
Ferritin: 48 ng/mL (ref 15–150)
Iron Saturation: 5 % — CL (ref 15–55)
Iron: 15 ug/dL — ABNORMAL LOW (ref 27–159)
Total Iron Binding Capacity: 323 ug/dL (ref 250–450)
UIBC: 308 ug/dL (ref 131–425)

## 2021-06-19 LAB — HEMOGLOBIN A1C
Est. average glucose Bld gHb Est-mCnc: 206 mg/dL
Hgb A1c MFr Bld: 8.8 % — ABNORMAL HIGH (ref 4.8–5.6)

## 2021-06-19 LAB — VITAMIN B12: Vitamin B-12: 670 pg/mL (ref 232–1245)

## 2021-07-01 ENCOUNTER — Ambulatory Visit (INDEPENDENT_AMBULATORY_CARE_PROVIDER_SITE_OTHER)
Admission: RE | Admit: 2021-07-01 | Discharge: 2021-07-01 | Disposition: A | Discharge status: Home / Self Care | Payer: BC Managed Care – PPO | Source: Ambulatory Visit | Attending: Internal Medicine | Admitting: Internal Medicine

## 2021-07-01 ENCOUNTER — Ambulatory Visit (HOSPITAL_COMMUNITY)
Admission: RE | Admit: 2021-07-01 | Discharge: 2021-07-01 | Disposition: A | Discharge status: Home / Self Care | Payer: BC Managed Care – PPO | Source: Ambulatory Visit | Attending: Internal Medicine | Admitting: Internal Medicine

## 2021-07-01 ENCOUNTER — Other Ambulatory Visit: Payer: Self-pay

## 2021-07-01 DIAGNOSIS — R0989 Other specified symptoms and signs involving the circulatory and respiratory systems: Secondary | ICD-10-CM

## 2021-07-02 ENCOUNTER — Other Ambulatory Visit: Payer: Self-pay | Admitting: Internal Medicine

## 2021-07-02 ENCOUNTER — Telehealth: Payer: Self-pay

## 2021-07-02 DIAGNOSIS — I739 Peripheral vascular disease, unspecified: Secondary | ICD-10-CM

## 2021-07-02 DIAGNOSIS — I779 Disorder of arteries and arterioles, unspecified: Secondary | ICD-10-CM

## 2021-07-02 NOTE — Telephone Encounter (Signed)
I left the pt a message that Dr. Baird Cancer is going to refer her to a vascular specialist because the pt has plaque in her arteries. I also asked that the pt call the office back.

## 2021-07-09 ENCOUNTER — Other Ambulatory Visit (INDEPENDENT_AMBULATORY_CARE_PROVIDER_SITE_OTHER): Payer: BC Managed Care – PPO

## 2021-07-09 DIAGNOSIS — D649 Anemia, unspecified: Secondary | ICD-10-CM

## 2021-07-09 LAB — HEMOCCULT GUIAC POC 1CARD (OFFICE)
Card #2 Fecal Occult Blod, POC: NEGATIVE
Card #3 Fecal Occult Blood, POC: NEGATIVE
Fecal Occult Blood, POC: NEGATIVE

## 2021-07-16 ENCOUNTER — Other Ambulatory Visit: Payer: Self-pay

## 2021-07-16 ENCOUNTER — Ambulatory Visit (INDEPENDENT_AMBULATORY_CARE_PROVIDER_SITE_OTHER): Payer: BC Managed Care – PPO | Admitting: Vascular Surgery

## 2021-07-16 ENCOUNTER — Encounter: Payer: Self-pay | Admitting: Vascular Surgery

## 2021-07-16 DIAGNOSIS — I6529 Occlusion and stenosis of unspecified carotid artery: Secondary | ICD-10-CM | POA: Insufficient documentation

## 2021-07-16 DIAGNOSIS — I6523 Occlusion and stenosis of bilateral carotid arteries: Secondary | ICD-10-CM

## 2021-07-16 DIAGNOSIS — I739 Peripheral vascular disease, unspecified: Secondary | ICD-10-CM | POA: Insufficient documentation

## 2021-07-16 NOTE — Progress Notes (Signed)
Patient name: Meagan [name] MRN: [id number] DOB: 01/08/1961 Sex: female  REASON FOR CONSULT: Evaluate carotid stenosis and PVD  HPI: Meagan [name] is a 60 y.o. female, with history of hypertension, hyperlipidemia, diabetes that presents for evaluation of carotid artery disease as well as PAD.  As it relates to her carotid artery disease she denies any history of TIAs or strokes.  She states her PCP heard a bruit that prompted the carotid ultrasound.  This showed 40 to 59% stenosis in the right ICA with a 60 to 79% stenosis in the left ICA.  She also complains of bilateral calf cramping after about 250 yards.  She has no rest pain or tissue loss.  ABIs were obtained on 07/01/2021 that showed dampened monophasic flow at the ankle.  She was also noted to have severely dampened brachial artery flow.  She has wounds on the left hand that she states is related to a hand infection after a pedicure earlier this year in February.  She states she is being followed by hand surgery.  Past Medical History:  Diagnosis Date   Diabetes mellitus ([place])    GERD (gastroesophageal reflux disease)    Hyperlipidemia    Hypertension     Past Surgical History:  Procedure Laterality Date   CARPAL TUNNEL RELEASE Left 01/31/2021   CARPAL TUNNEL RELEASE Right 2021   Dr. Mardelle Matte   DILATION AND CURETTAGE OF UTERUS  2007   ROTATOR CUFF REPAIR Right 2018   Dr. Mardelle Matte   TUBAL LIGATION  1992    Family History  Problem Relation Age of Onset   Kidney failure Mother    Liver disease Mother    Early death Father    Hypertension Brother    Colon cancer Neg Hx    Stomach cancer Neg Hx     SOCIAL HISTORY: Social History   Socioeconomic History   Marital status: Single    Spouse name: Not on file   Number of children: Not on file   Years of education: Not on file   Highest education level: Not on file  Occupational History   Not on file  Tobacco Use   Smoking status: Never   Smokeless tobacco:  Never  Vaping Use   Vaping Use: Never used  Substance and Sexual Activity   Alcohol use: Not Currently    Comment: occasional   Drug use: Never   Sexual activity: Not on file  Other Topics Concern   Not on file  Social History Narrative   Not on file   Social Determinants of Health   Financial Resource Strain: Not on file  Food Insecurity: Not on file  Transportation Needs: Not on file  Physical Activity: Not on file  Stress: Not on file  Social Connections: Not on file  Intimate Partner Violence: Not on file    Allergies  Allergen Reactions   Promethazine Hcl Other (See Comments)   Lisinopril Itching and Swelling   Other    Phenothiazines Other (See Comments)    Bells palsy Bells palsy    Current Outpatient Medications  Medication Sig Dispense Refill   amLODipine (NORVASC) 5 MG tablet TAKE 1 TABLET BY MOUTH EVERY DAY 90 tablet 1   atorvastatin (LIPITOR) 10 MG tablet Take 1 tablet (10 mg total) by mouth daily. 90 tablet 1   Blood Glucose Monitoring Suppl ([place]) w/Device KIT Use as directed to check blood sugars 2 times per day dx: e11.65 1 kit 1  ferrous sulfate 325 (65 FE) MG tablet Take 1 tablet (325 mg total) by mouth daily with breakfast. 30 tablet 3   gabapentin (NEURONTIN) 300 MG capsule Take 1 capsule (300 mg total) by mouth 3 (three) times daily. 90 capsule 2   glucose blood (ONETOUCH VERIO) test strip USE TO CHECK BLOOD SUGARS TWICE A DAY  DX:E11.65 100 each 12   insulin aspart (NOVOLOG FLEXPEN) 100 UNIT/ML FlexPen Inject 12 units 3 times per day with meals not to exceed 50 units 15 pen 1   Insulin Pen Needle (B-D ULTRAFINE III SHORT PEN) 31G X 8 MM MISC Use as directed with insulin pen 100 each 3   JANUMET 50-1000 MG tablet TAKE 1 TABLET BY MOUTH TWICE A DAY 180 tablet 1   JARDIANCE 10 MG TABS tablet TAKE 1 TABLET BY MOUTH EVERY DAY IN THE MORNING 90 tablet 1   MAGNESIUM PO Take by mouth. Take 4 tablets d     Multiple Vitamins-Minerals  (MULTIVITAMIN WITH MINERALS) tablet Take 1 tablet by mouth daily.     PRILOSEC OTC 20 MG tablet Take 20 mg by mouth daily.      sertraline (ZOLOFT) 50 MG tablet TAKE 1 TABLET BY MOUTH EVERY DAY 90 tablet 0   TRESIBA FLEXTOUCH 200 UNIT/ML FlexTouch Pen INJECT 66 UNITS SQ DAILY 9 mL 1   albuterol (PROAIR HFA) 108 (90 Base) MCG/ACT inhaler Inhale 1-2 puffs into the lungs every 6 (six) hours as needed for wheezing or shortness of breath. (Patient not taking: No sig reported) 1 Inhaler 0   Cholecalciferol (DIALYVITE VITAMIN D 5000 PO) Take 2 capsules by mouth daily. (Patient not taking: Reported on 07/16/2021)     No current facility-administered medications for this visit.    REVIEW OF SYSTEMS:  [X] denotes positive finding, [ ] denotes negative finding Cardiac  Comments:  Chest pain or chest pressure:    Shortness of breath upon exertion:    Short of breath when lying flat:    Irregular heart rhythm:        Vascular    Pain in calf, thigh, or hip brought on by ambulation:    Pain in feet at night that wakes you up from your sleep:     Blood clot in your veins:    Leg swelling:         Pulmonary    Oxygen at home:    Productive cough:     Wheezing:         Neurologic    Sudden weakness in arms or legs:     Sudden numbness in arms or legs:     Sudden onset of difficulty speaking or slurred speech:    Temporary loss of vision in one eye:     Problems with dizziness:         Gastrointestinal    Blood in stool:     Vomited blood:         Genitourinary    Burning when urinating:     Blood in urine:        Psychiatric    Major depression:         Hematologic    Bleeding problems:    Problems with blood clotting too easily:        Skin    Rashes or ulcers:        Constitutional    Fever or chills:      PHYSICAL EXAM: Vitals:   07/16/21 1546 07/16/21 1557  BP: 100/64 92/61  Pulse: 88 90  Resp: 14   Temp: 97.6 F (36.4 C)   TempSrc: Temporal   SpO2: 98%   Weight:  178 lb (80.7 kg)   Height: 5' 3" (1.6 m)     GENERAL: The patient is a well-nourished female, in no acute distress. The vital signs are documented above. CARDIAC: There is a regular rate and rhythm.  VASCULAR:  Left upper extremity tissue loss pictured below with no palpable radial ulnar or brachial pulse but I can palpate a left subclavian pulse Bilateral femoral pulses palpable No palpable pedal pulses No lower extremity tissue loss PULMONARY: There is good air exchange bilaterally without wheezing or rales. ABDOMEN: Soft and non-tender MUSCULOSKELETAL: There are no major deformities or cyanosis. NEUROLOGIC: No focal weakness or paresthesias are detected. PSYCHIATRIC: The patient has a normal affect.     DATA:   Carotid ultrasound shows 40 to 59% right ICA stenosis with a 60 to 79% left ICA stenosis  ABIs are noncompressible with dampened monophasic waveforms bilaterally -also noted to have severely dampened monophasic flow in the brachial arteries  Assessment/Plan:  60 year old female presents for evaluation of carotid artery disease and peripheral vascular disease.  As it relates to her carotid artery disease this is asymptomatic and the highest grade stenosis is on the left which is 60 to 79% in the ICA.  I discussed in the setting of asymptomatic disease would reserve surgical intervention for greater than 80% stenosis for stroke risk reduction.  I will plan to see her again in 6 months with carotid ultrasound.  Her lower extremity symptoms are consistent with claudication.  She is able to walk about 250 yards without stopping.  I do not consider this lifestyle limiting and recommend medical therapy as I discussed with her today.  I think her biggest issue is her left hand tissue loss as pictured above.  She states this is all related to a hand infection that started in February after a pedicure.  She states she is being followed by hand surgery. I cannot palpate a brachial  radial or ulnar pulse.  She has tissue necrosis and I think she would benefit from left upper extremity arteriogram with possible intervention to give her the best chance of healing these wounds.  Risk benefits discussed.  We got her scheduled today.   Marty Heck, MD [facility] Office: [phone]

## 2021-07-17 ENCOUNTER — Other Ambulatory Visit: Payer: Self-pay

## 2021-07-20 ENCOUNTER — Other Ambulatory Visit: Payer: Self-pay | Admitting: Internal Medicine

## 2021-07-20 DIAGNOSIS — F419 Anxiety disorder, unspecified: Secondary | ICD-10-CM

## 2021-07-26 ENCOUNTER — Other Ambulatory Visit: Payer: Self-pay | Admitting: Nurse Practitioner

## 2021-07-27 ENCOUNTER — Other Ambulatory Visit: Payer: Self-pay | Admitting: Internal Medicine

## 2021-08-01 ENCOUNTER — Ambulatory Visit (HOSPITAL_COMMUNITY)
Admission: RE | Admit: 2021-08-01 | Discharge: 2021-08-01 | Disposition: A | Discharge status: Home / Self Care | Payer: BC Managed Care – PPO | Attending: Vascular Surgery | Admitting: Vascular Surgery

## 2021-08-01 ENCOUNTER — Ambulatory Visit (HOSPITAL_BASED_OUTPATIENT_CLINIC_OR_DEPARTMENT_OTHER): Payer: BC Managed Care – PPO

## 2021-08-01 ENCOUNTER — Other Ambulatory Visit: Payer: Self-pay

## 2021-08-01 ENCOUNTER — Encounter (HOSPITAL_COMMUNITY)
Admission: RE | Disposition: A | Discharge status: Home / Self Care | Payer: Self-pay | Source: Home / Self Care | Attending: Vascular Surgery

## 2021-08-01 DIAGNOSIS — Z888 Allergy status to other drugs, medicaments and biological substances status: Secondary | ICD-10-CM | POA: Insufficient documentation

## 2021-08-01 DIAGNOSIS — K219 Gastro-esophageal reflux disease without esophagitis: Secondary | ICD-10-CM | POA: Insufficient documentation

## 2021-08-01 DIAGNOSIS — I10 Essential (primary) hypertension: Secondary | ICD-10-CM | POA: Insufficient documentation

## 2021-08-01 DIAGNOSIS — Z79899 Other long term (current) drug therapy: Secondary | ICD-10-CM | POA: Insufficient documentation

## 2021-08-01 DIAGNOSIS — Z8249 Family history of ischemic heart disease and other diseases of the circulatory system: Secondary | ICD-10-CM | POA: Diagnosis not present

## 2021-08-01 DIAGNOSIS — E785 Hyperlipidemia, unspecified: Secondary | ICD-10-CM | POA: Insufficient documentation

## 2021-08-01 DIAGNOSIS — Z794 Long term (current) use of insulin: Secondary | ICD-10-CM | POA: Diagnosis not present

## 2021-08-01 DIAGNOSIS — Z01810 Encounter for preprocedural cardiovascular examination: Secondary | ICD-10-CM | POA: Diagnosis not present

## 2021-08-01 DIAGNOSIS — Z7984 Long term (current) use of oral hypoglycemic drugs: Secondary | ICD-10-CM | POA: Diagnosis not present

## 2021-08-01 DIAGNOSIS — I70228 Atherosclerosis of native arteries of extremities with rest pain, other extremity: Secondary | ICD-10-CM | POA: Diagnosis not present

## 2021-08-01 DIAGNOSIS — E1151 Type 2 diabetes mellitus with diabetic peripheral angiopathy without gangrene: Secondary | ICD-10-CM | POA: Diagnosis not present

## 2021-08-01 DIAGNOSIS — I6523 Occlusion and stenosis of bilateral carotid arteries: Secondary | ICD-10-CM | POA: Insufficient documentation

## 2021-08-01 HISTORY — PX: AORTIC ARCH ANGIOGRAPHY: CATH118224

## 2021-08-01 HISTORY — PX: UPPER EXTREMITY ANGIOGRAPHY: CATH118270

## 2021-08-01 LAB — POCT I-STAT, CHEM 8
BUN: 21 mg/dL — ABNORMAL HIGH (ref 6–20)
Calcium, Ion: 1.21 mmol/L (ref 1.15–1.40)
Chloride: 103 mmol/L (ref 98–111)
Creatinine, Ser: 0.8 mg/dL (ref 0.44–1.00)
Glucose, Bld: 379 mg/dL — ABNORMAL HIGH (ref 70–99)
HCT: 36 % (ref 36.0–46.0)
Hemoglobin: 12.2 g/dL (ref 12.0–15.0)
Potassium: 4.6 mmol/L (ref 3.5–5.1)
Sodium: 137 mmol/L (ref 135–145)
TCO2: 24 mmol/L (ref 22–32)

## 2021-08-01 LAB — GLUCOSE, CAPILLARY: Glucose-Capillary: 306 mg/dL — ABNORMAL HIGH (ref 70–99)

## 2021-08-01 SURGERY — AORTIC ARCH ANGIOGRAPHY
Anesthesia: LOCAL

## 2021-08-01 MED ORDER — SODIUM CHLORIDE 0.9% FLUSH: 3.0000 mL | INTRAVENOUS | Status: DC | PRN

## 2021-08-01 MED ORDER — HEPARIN SODIUM (PORCINE) 1000 UNIT/ML IJ SOLN
INTRAMUSCULAR | Status: DC | PRN
  Administered 2021-08-01: 5000 [IU] via INTRAVENOUS

## 2021-08-01 MED ORDER — SODIUM CHLORIDE 0.9 % IV SOLN: 250.0000 mL | INTRAVENOUS | Status: DC | PRN

## 2021-08-01 MED ORDER — SODIUM CHLORIDE 0.9 % IV BOLUS
250.0000 mL | Freq: Once | INTRAVENOUS | Status: AC
  Administered 2021-08-01: 250 mL via INTRAVENOUS

## 2021-08-01 MED ORDER — HYDRALAZINE HCL 20 MG/ML IJ SOLN
5.0000 mg | INTRAMUSCULAR | Status: DC | PRN
Start: 2021-08-01 — End: 2021-08-01

## 2021-08-01 MED ORDER — HEPARIN (PORCINE) IN NACL 1000-0.9 UT/500ML-% IV SOLN
INTRAVENOUS | Status: AC
  Filled 2021-08-01: qty 1000

## 2021-08-01 MED ORDER — ONDANSETRON HCL 4 MG/2ML IJ SOLN: 4.0000 mg | Freq: Four times a day (QID) | INTRAMUSCULAR | Status: DC | PRN

## 2021-08-01 MED ORDER — SODIUM CHLORIDE 0.9 % IV SOLN: INTRAVENOUS | Status: DC

## 2021-08-01 MED ORDER — SODIUM CHLORIDE 0.9% FLUSH: 3.0000 mL | Freq: Two times a day (BID) | INTRAVENOUS | Status: DC

## 2021-08-01 MED ORDER — ASPIRIN EC 81 MG PO TBEC: 81.0000 mg | DELAYED_RELEASE_TABLET | Freq: Every day | ORAL | 2 refills | Status: AC

## 2021-08-01 MED ORDER — IODIXANOL 320 MG/ML IV SOLN
INTRAVENOUS | Status: DC | PRN
  Administered 2021-08-01: 170 mL via INTRA_ARTERIAL

## 2021-08-01 MED ORDER — MIDAZOLAM HCL 2 MG/2ML IJ SOLN
INTRAMUSCULAR | Status: AC
  Filled 2021-08-01: qty 2

## 2021-08-01 MED ORDER — FENTANYL CITRATE (PF) 100 MCG/2ML IJ SOLN
INTRAMUSCULAR | Status: AC
  Filled 2021-08-01: qty 2

## 2021-08-01 MED ORDER — LIDOCAINE HCL (PF) 1 % IJ SOLN
INTRAMUSCULAR | Status: AC
  Filled 2021-08-01: qty 30

## 2021-08-01 MED ORDER — INSULIN ASPART 100 UNIT/ML IJ SOLN
10.0000 [IU] | Freq: Once | INTRAMUSCULAR | Status: AC
  Administered 2021-08-01: 10 [IU] via SUBCUTANEOUS

## 2021-08-01 MED ORDER — ACETAMINOPHEN 325 MG PO TABS
650.0000 mg | ORAL_TABLET | ORAL | Status: DC | PRN
Start: 2021-08-01 — End: 2021-08-01

## 2021-08-01 MED ORDER — LIDOCAINE HCL (PF) 1 % IJ SOLN
INTRAMUSCULAR | Status: DC | PRN
  Administered 2021-08-01: 15 mL via SUBCUTANEOUS

## 2021-08-01 MED ORDER — HEPARIN SODIUM (PORCINE) 1000 UNIT/ML IJ SOLN
INTRAMUSCULAR | Status: AC
  Filled 2021-08-01: qty 1

## 2021-08-01 MED ORDER — ASPIRIN EC 81 MG PO TBEC
81.0000 mg | DELAYED_RELEASE_TABLET | Freq: Every day | ORAL | Status: DC
  Administered 2021-08-01: 81 mg via ORAL
  Filled 2021-08-01: qty 1

## 2021-08-01 MED ORDER — HEPARIN (PORCINE) IN NACL 1000-0.9 UT/500ML-% IV SOLN
INTRAVENOUS | Status: DC | PRN
  Administered 2021-08-01 (×2): 500 mL

## 2021-08-01 MED ORDER — MIDAZOLAM HCL 2 MG/2ML IJ SOLN
INTRAMUSCULAR | Status: DC | PRN
  Administered 2021-08-01: 1 mg via INTRAVENOUS

## 2021-08-01 MED ORDER — FENTANYL CITRATE (PF) 100 MCG/2ML IJ SOLN
INTRAMUSCULAR | Status: DC | PRN
  Administered 2021-08-01: 50 ug via INTRAVENOUS

## 2021-08-01 MED ORDER — LABETALOL HCL 5 MG/ML IV SOLN
10.0000 mg | INTRAVENOUS | Status: DC | PRN
Start: 2021-08-01 — End: 2021-08-01

## 2021-08-01 SURGICAL SUPPLY — 9 items
CATH ANGIO 5F PIGTAIL 100CM (CATHETERS) ×1 IMPLANT
DEVICE CLOSURE MYNXGRIP 5F (Vascular Products) ×1 IMPLANT
KIT MICROPUNCTURE NIT STIFF (SHEATH) ×1 IMPLANT
KIT PV (KITS) ×3 IMPLANT
SHEATH PINNACLE 5F 10CM (SHEATH) ×1 IMPLANT
SYR MEDRAD MARK 7 150ML (SYRINGE) ×3 IMPLANT
TRANSDUCER W/STOPCOCK (MISCELLANEOUS) ×3 IMPLANT
TRAY PV CATH (CUSTOM PROCEDURE TRAY) ×3 IMPLANT
WIRE STARTER BENTSON 035X150 (WIRE) ×1 IMPLANT

## 2021-08-01 NOTE — H&P (Signed)
History and Physical Interval Note:  08/01/2021 12:02 PM  Meagan [name]  has presented today for surgery, with the diagnosis of carotid stenosis - pvd.  The various methods of treatment have been discussed with the patient and family. After consideration of risks, benefits and other options for treatment, the patient has consented to  Procedure(s): AORTIC ARCH ANGIOGRAPHY (N/A) UPPER EXTREMITY ANGIOGRAPHY (Left) as a surgical intervention.  The patient's history has been reviewed, patient examined, no change in status, stable for surgery.  I have reviewed the patient's chart and labs.  Questions were answered to the patient's satisfaction.     Meagan [name]  Patient name: Meagan [name]       MRN: [id number]        DOB: 05/28/1961          Sex: female   REASON FOR CONSULT: Evaluate carotid stenosis and PVD   HPI: Meagan [name] is a 60 y.o. female, with history of hypertension, hyperlipidemia, diabetes that presents for evaluation of carotid artery disease as well as PAD.  As it relates to her carotid artery disease she denies any history of TIAs or strokes.  She states her PCP heard a bruit that prompted the carotid ultrasound.  This showed 40 to 59% stenosis in the right ICA with a 60 to 79% stenosis in the left ICA.   She also complains of bilateral calf cramping after about 250 yards.  She has no rest pain or tissue loss.  ABIs were obtained on 07/01/2021 that showed dampened monophasic flow at the ankle.  She was also noted to have severely dampened brachial artery flow.   She has wounds on the left hand that she states is related to a hand infection after a pedicure earlier this year in February.  She states she is being followed by hand surgery.       Past Medical History:  Diagnosis Date   Diabetes mellitus ([place])     GERD (gastroesophageal reflux disease)     Hyperlipidemia     Hypertension             Past Surgical History:  Procedure Laterality Date   CARPAL  TUNNEL RELEASE Left 01/31/2021   CARPAL TUNNEL RELEASE Right 2021    Dr. Mardelle Matte   DILATION AND CURETTAGE OF UTERUS   2007   ROTATOR CUFF REPAIR Right 2018    Dr. Mardelle Matte   TUBAL LIGATION   1992           Family History  Problem Relation Age of Onset   Kidney failure Mother     Liver disease Mother     Early death Father     Hypertension Brother     Colon cancer Neg Hx     Stomach cancer Neg Hx        SOCIAL HISTORY: Social History         Socioeconomic History   Marital status: Single      Spouse name: Not on file   Number of children: Not on file   Years of education: Not on file   Highest education level: Not on file  Occupational History   Not on file  Tobacco Use   Smoking status: Never   Smokeless tobacco: Never  Vaping Use   Vaping Use: Never used  Substance and Sexual Activity   Alcohol use: Not Currently      Comment: occasional   Drug use: Never   Sexual activity: Not on file  Other Topics  Concern   Not on file  Social History Narrative   Not on file    Social Determinants of Health    Financial Resource Strain: Not on file  Food Insecurity: Not on file  Transportation Needs: Not on file  Physical Activity: Not on file  Stress: Not on file  Social Connections: Not on file  Intimate Partner Violence: Not on file           Allergies  Allergen Reactions   Promethazine Hcl Other (See Comments)   Lisinopril Itching and Swelling   Other     Phenothiazines Other (See Comments)      Bells palsy Bells palsy            Current Outpatient Medications  Medication Sig Dispense Refill   amLODipine (NORVASC) 5 MG tablet TAKE 1 TABLET BY MOUTH EVERY DAY 90 tablet 1   atorvastatin (LIPITOR) 10 MG tablet Take 1 tablet (10 mg total) by mouth daily. 90 tablet 1   Blood Glucose Monitoring Suppl ([place]) w/Device KIT Use as directed to check blood sugars 2 times per day dx: e11.65 1 kit 1   ferrous sulfate 325 (65 FE) MG tablet Take 1  tablet (325 mg total) by mouth daily with breakfast. 30 tablet 3   gabapentin (NEURONTIN) 300 MG capsule Take 1 capsule (300 mg total) by mouth 3 (three) times daily. 90 capsule 2   glucose blood (ONETOUCH VERIO) test strip USE TO CHECK BLOOD SUGARS TWICE A DAY  DX:E11.65 100 each 12   insulin aspart (NOVOLOG FLEXPEN) 100 UNIT/ML FlexPen Inject 12 units 3 times per day with meals not to exceed 50 units 15 pen 1   Insulin Pen Needle (B-D ULTRAFINE III SHORT PEN) 31G X 8 MM MISC Use as directed with insulin pen 100 each 3   JANUMET 50-1000 MG tablet TAKE 1 TABLET BY MOUTH TWICE A DAY 180 tablet 1   JARDIANCE 10 MG TABS tablet TAKE 1 TABLET BY MOUTH EVERY DAY IN THE MORNING 90 tablet 1   MAGNESIUM PO Take by mouth. Take 4 tablets d       Multiple Vitamins-Minerals (MULTIVITAMIN WITH MINERALS) tablet Take 1 tablet by mouth daily.       PRILOSEC OTC 20 MG tablet Take 20 mg by mouth daily.        sertraline (ZOLOFT) 50 MG tablet TAKE 1 TABLET BY MOUTH EVERY DAY 90 tablet 0   TRESIBA FLEXTOUCH 200 UNIT/ML FlexTouch Pen INJECT 66 UNITS SQ DAILY 9 mL 1   albuterol (PROAIR HFA) 108 (90 Base) MCG/ACT inhaler Inhale 1-2 puffs into the lungs every 6 (six) hours as needed for wheezing or shortness of breath. (Patient not taking: No sig reported) 1 Inhaler 0   Cholecalciferol (DIALYVITE VITAMIN D 5000 PO) Take 2 capsules by mouth daily. (Patient not taking: Reported on 07/16/2021)        No current facility-administered medications for this visit.      REVIEW OF SYSTEMS:  _0  denotes positive finding, _1  denotes negative finding Cardiac   Comments:  Chest pain or chest pressure:      Shortness of breath upon exertion:      Short of breath when lying flat:      Irregular heart rhythm:             Vascular      Pain in calf, thigh, or hip brought on by ambulation:      Pain in feet at  night that wakes you up from your sleep:       Blood clot in your veins:      Leg swelling:              Pulmonary       Oxygen at home:      Productive cough:       Wheezing:              Neurologic      Sudden weakness in arms or legs:       Sudden numbness in arms or legs:       Sudden onset of difficulty speaking or slurred speech:      Temporary loss of vision in one eye:       Problems with dizziness:              Gastrointestinal      Blood in stool:       Vomited blood:              Genitourinary      Burning when urinating:       Blood in urine:             Psychiatric      Major depression:              Hematologic      Bleeding problems:      Problems with blood clotting too easily:             Skin      Rashes or ulcers:             Constitutional      Fever or chills:          PHYSICAL EXAM:     Vitals:    07/16/21 1546 07/16/21 1557  BP: 100/64 92/61  Pulse: 88 90  Resp: 14    Temp: 97.6 F (36.4 C)    TempSrc: Temporal    SpO2: 98%    Weight: 178 lb (80.7 kg)    Height: _0  (1.6 m)        GENERAL: The patient is a well-nourished female, in no acute distress. The vital signs are documented above. CARDIAC: There is a regular rate and rhythm.  VASCULAR:  Left upper extremity tissue loss pictured below with no palpable radial ulnar or brachial pulse but I can palpate a left subclavian pulse Bilateral femoral pulses palpable No palpable pedal pulses No lower extremity tissue loss PULMONARY: There is good air exchange bilaterally without wheezing or rales. ABDOMEN: Soft and non-tender MUSCULOSKELETAL: There are no major deformities or cyanosis. NEUROLOGIC: No focal weakness or paresthesias are detected. PSYCHIATRIC: The patient has a normal affect.       DATA:    Carotid ultrasound shows 40 to 59% right ICA stenosis with a 60 to 79% left ICA stenosis   ABIs are noncompressible with dampened monophasic waveforms bilaterally -also noted to have severely dampened monophasic flow in the brachial arteries   Assessment/Plan:   60 year old female presents  for evaluation of carotid artery disease and peripheral vascular disease.  As it relates to her carotid artery disease this is asymptomatic and the highest grade stenosis is on the left which is 60 to 79% in the ICA.  I discussed in the setting of asymptomatic disease would reserve surgical intervention for greater than 80% stenosis for stroke risk reduction.  I will plan to see her again in 6 months with carotid ultrasound.  Her lower extremity symptoms are consistent with claudication.  She is able to walk about 250 yards without stopping.  I do not consider this lifestyle limiting and recommend medical therapy as I discussed with her today.   I think her biggest issue is her left hand tissue loss as pictured above.  She states this is all related to a hand infection that started in February after a pedicure.  She states she is being followed by hand surgery. I cannot palpate a brachial radial or ulnar pulse.  She has tissue necrosis and I think she would benefit from left upper extremity arteriogram with possible intervention to give her the best chance of healing these wounds.  Risk benefits discussed.  We got her scheduled today.     Meagan Heck, MD [facility] Office: [phone]

## 2021-08-01 NOTE — Op Note (Signed)
    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  Ultrasound-guided access right common femoral artery 2.  Limited arch aortogram with catheter selection of aorta 3.  Left upper extremity arteriogram 4.  29 minutes of monitored moderate conscious sedation time 5.  Mynx closure of the right common femoral artery  Indications: 60 year old female that was seen in the office for evaluation of PAD.  As part of her evaluation we noted that she had significant tissue loss to the left upper extremity with no palpable pulses at the wrist.  She presents for left upper extremity arteriogram with possible intervention after risk benefits discussed.  Findings:   Arch aortogram shows a type I arch.  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  She does reconstitute her brachial artery in the left upper extremity with ulnar and radial runoff to the wrist.  Dominant runoff into the hand through the ulnar.  Flow into the left hand is limited on evaluation given underfilling with proximal occlusive disease  The left carotid was also evaluated and the common carotid appears widely patent although there is a moderate 60 to 79% stenosis in the left ICA consistent with her ultrasound.  Visualized portions of the innominate and right subclavian and right common carotid are patent.   Procedure:  The patient was identified in the holding area and taken to room 8.  The patient was then placed supine on the table and prepped and draped in the usual sterile  fashion.  A time out was called.  Ultrasound was used to evaluate the right common femoral artery.  It was patent .  A digital ultrasound image was acquired.  A micropuncture needle was used to access the right common femoral artery under ultrasound guidance.  An 018 wire was advanced without resistance and a micropuncture sheath was placed.  The 018 wire was removed and a benson wire was placed.  The patient was given 5000 units of IV heparin.  A pigtail catheter was advanced all the way up into the ascending aorta.  Limited arch aortogram was obtained.  The left subclavian artery was noted to be patent proximally then occluded beyond the vertebral artery.  We then got additional imaging of the left upper extremity given that this is the extremity of interest and she did reconstitute her brachial artery in the upper arm.  He also got some images of the common carotid and vertebral artery with an additional arch aortogram.  Wires and catheters were removed.  A mynx closure device was deployed in the right common femoral artery.  Plan: We will get vein mapping today.  Patient will be scheduled for left upper extremity bypass with me on 08/12/2021 and likely left carotid to brachial artery bypass.   Marty Heck, MD [facility] Office: [phone]

## 2021-08-02 ENCOUNTER — Encounter (HOSPITAL_COMMUNITY): Payer: Self-pay | Admitting: Vascular Surgery

## 2021-08-02 ENCOUNTER — Other Ambulatory Visit: Payer: Self-pay

## 2021-08-03 ENCOUNTER — Other Ambulatory Visit: Payer: Self-pay | Admitting: Internal Medicine

## 2021-08-03 DIAGNOSIS — E782 Mixed hyperlipidemia: Secondary | ICD-10-CM

## 2021-08-07 NOTE — Pre-Procedure Instructions (Signed)
Meagan [name]  08/07/2021     Your procedure is scheduled on Mon., Sept. 12, 2022 from 10:00AM-12:10PM  Report to [facility] Entrance "A" at 8:00AM  Call this number if you have problems the morning of surgery:  [phone]   Remember:  Do not eat or drink after midnight on Sept. 11th    Take these medicines the morning of surgery with A SIP OF WATER: AmLODipine (NORVASC) Aspirin EC Atorvastatin (LIPITOR) Gabapentin (NEURONTIN) PRILOSEC  ValACYclovir (VALTREX)  As of today, STOP taking all Aspirin (unless instructed by your doctor) and Other Aspirin containing products, Vitamins, Fish oils, and Herbal medications. Also stop all NSAIDS i.e. Advil, Ibuprofen, Motrin, Aleve, Anaprox, Naproxen, BC, Goody Powders, and all Supplements.    WHAT DO I DO ABOUT MY DIABETES MEDICATION?  Take last dose of JARDIANCE on 08/10/21. Do not take JANUMET and JARDIANCE the morning of surgery.    THE MORNING OF SURGERY, take ______33_______ units of ____insulin degludec (TRESIBA FLEXTOUCH)______insulin.  How to Manage Your Diabetes Before and After Surgery  Why is it important to control my blood sugar before and after surgery? Improving blood sugar levels before and after surgery helps healing and can limit problems. A way of improving blood sugar control is eating a healthy diet by:  Eating less sugar and carbohydrates  Increasing activity/exercise  Talking with your doctor about reaching your blood sugar goals High blood sugars (greater than 180 mg/dL) can raise your risk of infections and slow your recovery, so you will need to focus on controlling your diabetes during the weeks before surgery. Make sure that the doctor who takes care of your diabetes knows about your planned surgery including the date and location.  How do I manage my blood sugar before surgery? Check your blood sugar at least 4 times a day, starting 2 days before surgery, to make sure that the level is not  too high or low. Check your blood sugar the morning of your surgery when you wake up and every 2 hours until you get to the Short Stay unit. If your blood sugar is less than 70 mg/dL, you will need to treat for low blood sugar: Do not take insulin. Treat a low blood sugar (less than 70 mg/dL) with  cup of clear juice (cranberry or apple), 4 glucose tablets, OR glucose gel. Recheck blood sugar in 15 minutes after treatment (to make sure it is greater than 70 mg/dL). If your blood sugar is not greater than 70 mg/dL on recheck, call [phone]  for further instructions.         If your CBG is greater than 220 mg/dL, you may take (6 units) of your Novolog insulin.  Reviewed and Endorsed by [facility] Patient Education Committee, August 2015   No Smoking of any kind, Tobacco/Vaping, or Alcohol products 24 hours prior to your procedure. If you use a Cpap at night, you may bring all equipment for your overnight stay.    Day of Surgery:  Do not wear jewelry, make-up. Do Not wear nail polish, gel polish, artificial nails, or any other type of covering on  natural nails including finger and toenails. If patients have artificial nails, gel coating, etc. that need to be removed by a nail salon please have this removed prior to surgery or surgery may need to be canceled/delayed if the surgeon/ anesthesia feels like the patient is unable to be adequately monitored.  Do not wear lotions, powders, or perfumes/colognes, or deodorant.  Do not shave 48 hours prior to surgery.    Do not bring valuables to the hospital.  [facility] is not responsible for any belongings or valuables.  Contacts, dentures or bridgework may not be worn into surgery.    For patients admitted to the hospital, discharge time will be determined by your treatment team.  Patients discharged the day of surgery will not be allowed to drive home, and someone age 62 and over needs to stay with them for 24 hours.  Oral Hygiene is  also important to reduce your risk of infection.  Remember - BRUSH YOUR TEETH THE MORNING OF SURGERY WITH YOUR REGULAR TOOTHPASTE  Special instructions:  [place]- Preparing For Surgery  Before surgery, you can play an important role. Because skin is not sterile, your skin needs to be as free of germs as possible. You can reduce the number of germs on your skin by washing with CHG (chlorahexidine gluconate) Soap before surgery.  CHG is an antiseptic cleaner which kills germs and bonds with the skin to continue killing germs even after washing.    Please do not use if you have an allergy to CHG or antibacterial soaps. If your skin becomes reddened/irritated stop using the CHG.  Do not shave (including legs and underarms) for at least 48 hours prior to first CHG shower. It is OK to shave your face.  Please follow these instructions carefully.   Shower the NIGHT BEFORE SURGERY and the MORNING OF SURGERY with CHG.   If you chose to wash your hair, wash your hair first as usual with your normal shampoo.  After you shampoo, rinse your hair and body thoroughly to remove the shampoo.  Use CHG as you would any other liquid soap. You can apply CHG directly to the skin and wash gently with a scrungie or a clean washcloth.   Apply the CHG Soap to your body ONLY FROM THE NECK DOWN.  Do not use on open wounds or open sores. Avoid contact with your eyes, ears, mouth and genitals (private parts). Wash Face and genitals (private parts)  with your normal soap.  Wash thoroughly, paying special attention to the area where your surgery will be performed.  Thoroughly rinse your body with warm water from the neck down.  DO NOT shower/wash with your normal soap after using and rinsing off the CHG Soap.  Pat yourself dry with a CLEAN TOWEL.  Wear CLEAN PAJAMAS to bed the night before surgery, wear comfortable clothes the morning of surgery  Place CLEAN SHEETS on your bed the night of your first shower and  DO NOT SLEEP WITH PETS.  Reminders: Do not apply any deodorants/lotions.  Please wear clean clothes to the hospital/surgery center.   Remember to brush your teeth WITH YOUR REGULAR TOOTHPASTE.  Please read over the following fact sheets that you were given.

## 2021-08-08 ENCOUNTER — Other Ambulatory Visit: Payer: Self-pay

## 2021-08-08 ENCOUNTER — Encounter (HOSPITAL_COMMUNITY): Payer: Self-pay

## 2021-08-08 ENCOUNTER — Encounter (HOSPITAL_COMMUNITY)
Admission: RE | Admit: 2021-08-08 | Discharge: 2021-08-08 | Disposition: A | Discharge status: Home / Self Care | Payer: BC Managed Care – PPO | Source: Ambulatory Visit | Attending: Vascular Surgery | Admitting: Vascular Surgery

## 2021-08-08 DIAGNOSIS — Z01812 Encounter for preprocedural laboratory examination: Secondary | ICD-10-CM | POA: Insufficient documentation

## 2021-08-08 DIAGNOSIS — Z7982 Long term (current) use of aspirin: Secondary | ICD-10-CM | POA: Insufficient documentation

## 2021-08-08 DIAGNOSIS — I6522 Occlusion and stenosis of left carotid artery: Secondary | ICD-10-CM | POA: Diagnosis not present

## 2021-08-08 DIAGNOSIS — Z794 Long term (current) use of insulin: Secondary | ICD-10-CM | POA: Insufficient documentation

## 2021-08-08 DIAGNOSIS — Z7984 Long term (current) use of oral hypoglycemic drugs: Secondary | ICD-10-CM | POA: Insufficient documentation

## 2021-08-08 DIAGNOSIS — Z79899 Other long term (current) drug therapy: Secondary | ICD-10-CM | POA: Insufficient documentation

## 2021-08-08 DIAGNOSIS — Z20822 Contact with and (suspected) exposure to covid-19: Secondary | ICD-10-CM | POA: Diagnosis not present

## 2021-08-08 DIAGNOSIS — I129 Hypertensive chronic kidney disease with stage 1 through stage 4 chronic kidney disease, or unspecified chronic kidney disease: Secondary | ICD-10-CM | POA: Insufficient documentation

## 2021-08-08 DIAGNOSIS — Z7901 Long term (current) use of anticoagulants: Secondary | ICD-10-CM | POA: Diagnosis not present

## 2021-08-08 DIAGNOSIS — E1122 Type 2 diabetes mellitus with diabetic chronic kidney disease: Secondary | ICD-10-CM | POA: Insufficient documentation

## 2021-08-08 DIAGNOSIS — N189 Chronic kidney disease, unspecified: Secondary | ICD-10-CM | POA: Diagnosis not present

## 2021-08-08 HISTORY — DX: Chronic kidney disease, unspecified: N18.9

## 2021-08-08 HISTORY — DX: Other complications of anesthesia, initial encounter: T88.59XA

## 2021-08-08 HISTORY — DX: Nausea with vomiting, unspecified: R11.2

## 2021-08-08 HISTORY — DX: Unspecified osteoarthritis, unspecified site: M19.90

## 2021-08-08 HISTORY — DX: Nausea with vomiting, unspecified: Z98.890

## 2021-08-08 LAB — COMPREHENSIVE METABOLIC PANEL
ALT: 18 U/L (ref 0–44)
AST: 24 U/L (ref 15–41)
Albumin: 3 g/dL — ABNORMAL LOW (ref 3.5–5.0)
Alkaline Phosphatase: 90 U/L (ref 38–126)
Anion gap: 11 (ref 5–15)
BUN: 15 mg/dL (ref 6–20)
CO2: 20 mmol/L — ABNORMAL LOW (ref 22–32)
Calcium: 8.8 mg/dL — ABNORMAL LOW (ref 8.9–10.3)
Chloride: 104 mmol/L (ref 98–111)
Creatinine, Ser: 1.16 mg/dL — ABNORMAL HIGH (ref 0.44–1.00)
GFR, Estimated: 54 mL/min — ABNORMAL LOW (ref >60)
Glucose, Bld: 181 mg/dL — ABNORMAL HIGH (ref 70–99)
Potassium: 3.9 mmol/L (ref 3.5–5.1)
Sodium: 135 mmol/L (ref 135–145)
Total Bilirubin: 0.5 mg/dL (ref 0.3–1.2)
Total Protein: 7.7 g/dL (ref 6.5–8.1)

## 2021-08-08 LAB — URINALYSIS, ROUTINE W REFLEX MICROSCOPIC
Bilirubin Urine: NEGATIVE
Glucose, UA: >=500 mg/dL — AB
Hgb urine dipstick: NEGATIVE
Ketones, ur: NEGATIVE mg/dL
Leukocytes,Ua: NEGATIVE
Nitrite: NEGATIVE
Protein, ur: NEGATIVE mg/dL
Specific Gravity, Urine: 1.01 (ref 1.005–1.030)
pH: 6 (ref 5.0–8.0)

## 2021-08-08 LAB — URINALYSIS, MICROSCOPIC (REFLEX)

## 2021-08-08 LAB — CBC
HCT: 36.1 % (ref 36.0–46.0)
Hemoglobin: 10.1 g/dL — ABNORMAL LOW (ref 12.0–15.0)
MCH: 22.1 pg — ABNORMAL LOW (ref 26.0–34.0)
MCHC: 28 g/dL — ABNORMAL LOW (ref 30.0–36.0)
MCV: 79 fL — ABNORMAL LOW (ref 80.0–100.0)
Platelets: 521 10*3/uL — ABNORMAL HIGH (ref 150–400)
RBC: 4.57 MIL/uL (ref 3.87–5.11)
RDW: 20.6 % — ABNORMAL HIGH (ref 11.5–15.5)
WBC: 8.5 10*3/uL (ref 4.0–10.5)
nRBC: 0 % (ref 0.0–0.2)

## 2021-08-08 LAB — PROTIME-INR
INR: 0.9 (ref 0.8–1.2)
Prothrombin Time: 12.6 seconds (ref 11.4–15.2)

## 2021-08-08 LAB — TYPE AND SCREEN
ABO/RH(D): B POS
Antibody Screen: NEGATIVE

## 2021-08-08 LAB — APTT: aPTT: 32 seconds (ref 24–36)

## 2021-08-08 LAB — SURGICAL PCR SCREEN
MRSA, PCR: NEGATIVE
Staphylococcus aureus: NEGATIVE

## 2021-08-08 LAB — GLUCOSE, CAPILLARY: Glucose-Capillary: 188 mg/dL — ABNORMAL HIGH (ref 70–99)

## 2021-08-08 LAB — SARS CORONAVIRUS 2 (TAT 6-24 HRS): SARS Coronavirus 2: NEGATIVE

## 2021-08-08 NOTE — Progress Notes (Signed)
PCP - Glendale Chard, MD  Cardiologist - Monica Martinez, MD  PPM/ICD - n/a  Chest x-ray - 11/15/20 EKG - 06/18/21 Stress Test - pt denies ECHO - pt denies Cardiac Cath - pt denies  Sleep Study - pt denies  Fasting Blood Sugar - 140; 188 in PAT Checks Blood Sugar once a day  Blood Thinner Instructions: n/a Aspirin Instructions: Follow your surgeon's instructions on when to stop Aspirin.  If no instructions were given by your surgeon then you will need to call the office to get those instructions.     NPO at midnight  COVID TEST- 08/08/21 in PAT  Anesthesia review: abnormal A1C  **pt's BP in PAT 97/47 initially. Has not taken amlodipine today. Repeat BP 90/65. Levada Dy, NP notified, and advised RN to have pt hold amlodipine and contact PCP about recent low BP's. BP's written down and given to pt, who verbalized understanding. Pt in NAD.  Patient denies shortness of breath, fever, cough and chest pain at PAT appointment   All instructions explained to the patient, with a verbal understanding of the material. Patient agrees to go over the instructions while at home for a better understanding. Patient also instructed to self quarantine after being tested for COVID-19. The opportunity to ask questions was provided.

## 2021-08-09 NOTE — Anesthesia Preprocedure Evaluation (Addendum)
Anesthesia Evaluation  Patient identified by MRN, date of birth, ID band Patient awake    Reviewed: Allergy & Precautions, NPO status , Patient's Chart, lab work & pertinent test results  History of Anesthesia Complications (+) PONV  Airway Mallampati: III  TM Distance: >3 FB Neck ROM: Full    Dental  (+) Missing   Pulmonary neg pulmonary ROS,    Pulmonary exam normal breath sounds clear to auscultation       Cardiovascular hypertension, Pt. on medications + Peripheral Vascular Disease  Normal cardiovascular exam Rhythm:Regular Rate:Normal     Neuro/Psych negative neurological ROS  negative psych ROS   GI/Hepatic Neg liver ROS, GERD  Medicated and Controlled,  Endo/Other  diabetes, Insulin Dependent  Renal/GU Renal InsufficiencyRenal disease     Musculoskeletal  (+) Arthritis ,   Abdominal (+) + obese,   Peds  Hematology HLD   Anesthesia Other Findings Carotid Artery Stenosis  Reproductive/Obstetrics                           Anesthesia Physical Anesthesia Plan  ASA: 3  Anesthesia Plan: General   Post-op Pain Management:    Induction: Intravenous  PONV Risk Score and Plan: 4 or greater and Ondansetron, Dexamethasone, Midazolam, Scopolamine patch - Pre-op and Treatment may vary due to age or medical condition  Airway Management Planned: Oral ETT  Additional Equipment: Arterial line  Intra-op Plan:   Post-operative Plan: Extubation in OR  Informed Consent: I have reviewed the patients History and Physical, chart, labs and discussed the procedure including the risks, benefits and alternatives for the proposed anesthesia with the patient or authorized representative who has indicated his/her understanding and acceptance.     Dental advisory given  Plan Discussed with: CRNA  Anesthesia Plan Comments: (Reviewed APP note by Durel Salts, FNP )      Anesthesia Quick  Evaluation

## 2021-08-09 NOTE — Progress Notes (Signed)
Anesthesia Chart Review:   Case: [id number] Date/Time: 08/12/21 0945   Procedure: LEFT CAROTID-BRACHIAL ARTERY BYPASS (Left)   Anesthesia type: General   Pre-op diagnosis: Carotid Artery Stenosis   Location: [facility] OR ROOM 11 / [place] OR   Surgeons: Marty Heck, MD       DISCUSSION: Pt is 60 years old with hx HTN, DM, CKD  BP at preadmission testing was initially 97/47, 90/65 on recheck.  Patient reports her blood pressures have been low lately and she did not take her amlodipine today.  I advised the patient to continue holding her amlodipine and to contact her PCP to let her know what was going on.  Patient denies dizziness or shortness of breath.   VS: BP 90/65   Pulse 60   Temp 36.7 C (Oral)   Resp 17   Ht '5\' 3"'  (1.6 m)   Wt 82.2 kg   SpO2 94%   BMI 32.10 kg/m   PROVIDERS: - PCP is Glendale Chard, MD   LABS: Labs reviewed: Acceptable for surgery. - HbA1c was 8.8 on 06/18/21  (all labs ordered are listed, but only abnormal results are displayed)  Labs Reviewed  GLUCOSE, CAPILLARY - Abnormal; Notable for the following components:      Result Value   Glucose-Capillary 188 (*)    All other components within normal limits  CBC - Abnormal; Notable for the following components:   Hemoglobin 10.1 (*)    MCV 79.0 (*)    MCH 22.1 (*)    MCHC 28.0 (*)    RDW 20.6 (*)    Platelets 521 (*)    All other components within normal limits  COMPREHENSIVE METABOLIC PANEL - Abnormal; Notable for the following components:   CO2 20 (*)    Glucose, Bld 181 (*)    Creatinine, Ser 1.16 (*)    Calcium 8.8 (*)    Albumin 3.0 (*)    GFR, Estimated 54 (*)    All other components within normal limits  URINALYSIS, ROUTINE W REFLEX MICROSCOPIC - Abnormal; Notable for the following components:   Glucose, UA >=500 (*)    All other components within normal limits  URINALYSIS, MICROSCOPIC (REFLEX) - Abnormal; Notable for the following components:   Bacteria, UA RARE (*)    All other  components within normal limits  SURGICAL PCR SCREEN  SARS CORONAVIRUS 2 (TAT 6-24 HRS)  PROTIME-INR  APTT  TYPE AND SCREEN     IMAGES: CXR 11/15/20: No active cardiopulmonary disease   EKG 06/18/21: Sinus tachycardia (105 bpm)   CV: Carotid duplex 07/01/21:  - Right Carotid: Velocities in the right ICA are consistent with a 40-59% stenosis. The ECA appears >50% stenosed.  - Left Carotid: Velocities in the left ICA are consistent with a 60-79% stenosis. The ECA appears >50% stenosed.   Past Medical History:  Diagnosis Date   Arthritis    Chronic kidney disease    Complication of anesthesia    Diabetes mellitus (HCC)    GERD (gastroesophageal reflux disease)    Hyperlipidemia    Hypertension    PONV (postoperative nausea and vomiting)     Past Surgical History:  Procedure Laterality Date   AORTIC ARCH ANGIOGRAPHY N/A 08/01/2021   Procedure: AORTIC ARCH ANGIOGRAPHY;  Surgeon: Marty Heck, MD;  Location: [place] [facility];  Service: Cardiovascular;  Laterality: N/A;   CARPAL TUNNEL RELEASE Left 01/31/2021   CARPAL TUNNEL RELEASE Right 2021   Dr. Mardelle Matte   DILATION AND CURETTAGE  OF UTERUS  2007   ROTATOR CUFF REPAIR Right 2018   Dr. Mardelle Matte   TUBAL LIGATION  1992   UPPER EXTREMITY ANGIOGRAPHY Left 08/01/2021   Procedure: UPPER EXTREMITY ANGIOGRAPHY;  Surgeon: Marty Heck, MD;  Location: [place] [facility];  Service: Cardiovascular;  Laterality: Left;    MEDICATIONS:  amLODipine (NORVASC) 5 MG tablet   aspirin EC 81 MG tablet   atorvastatin (LIPITOR) 10 MG tablet   Blood Glucose Monitoring Suppl (ONETOUCH VERIO FLEX SYSTEM) w/Device KIT   Cholecalciferol (DIALYVITE VITAMIN D 5000 PO)   diclofenac Sodium (VOLTAREN) 1 % GEL   ferrous sulfate 325 (65 FE) MG tablet   gabapentin (NEURONTIN) 300 MG capsule   glucose blood (ONETOUCH VERIO) test strip   insulin aspart (NOVOLOG FLEXPEN) 100 UNIT/ML FlexPen   insulin degludec (TRESIBA FLEXTOUCH) 200 UNIT/ML  FlexTouch Pen   Insulin Pen Needle (B-D ULTRAFINE III SHORT PEN) 31G X 8 MM MISC   JANUMET 50-1000 MG tablet   JARDIANCE 10 MG TABS tablet   MAGNESIUM PO   Multiple Vitamin (MULTIVITAMIN WITH MINERALS) TABS tablet   OIL OF OREGANO PO   Omega-3 Fatty Acids (FISH OIL PO)   PRILOSEC OTC 20 MG tablet   sertraline (ZOLOFT) 50 MG tablet   valACYclovir (VALTREX) 500 MG tablet   No current facility-administered medications for this encounter.    If no changes, I anticipate pt can proceed with surgery as scheduled.   Willeen Cass, PhD, FNP-BC [facility]/Anesthesiology Phone: [phone] 08/09/2021 12:15 PM

## 2021-08-12 ENCOUNTER — Inpatient Hospital Stay (HOSPITAL_COMMUNITY)
Admission: RE | Admit: 2021-08-12 | Discharge: 2021-08-14 | DRG: 253 | Disposition: A | Discharge status: Home / Self Care | Payer: BC Managed Care – PPO | Attending: Vascular Surgery | Admitting: Vascular Surgery

## 2021-08-12 ENCOUNTER — Encounter (HOSPITAL_COMMUNITY)
Admission: RE | Disposition: A | Discharge status: Home / Self Care | Payer: Self-pay | Source: Home / Self Care | Attending: Vascular Surgery

## 2021-08-12 ENCOUNTER — Other Ambulatory Visit: Payer: Self-pay

## 2021-08-12 ENCOUNTER — Encounter (HOSPITAL_COMMUNITY): Payer: Self-pay | Admitting: Vascular Surgery

## 2021-08-12 ENCOUNTER — Inpatient Hospital Stay (HOSPITAL_COMMUNITY): Payer: BC Managed Care – PPO | Admitting: Emergency Medicine

## 2021-08-12 DIAGNOSIS — L089 Local infection of the skin and subcutaneous tissue, unspecified: Secondary | ICD-10-CM | POA: Diagnosis present

## 2021-08-12 DIAGNOSIS — Z0000 Encounter for general adult medical examination without abnormal findings: Secondary | ICD-10-CM

## 2021-08-12 DIAGNOSIS — Z7984 Long term (current) use of oral hypoglycemic drugs: Secondary | ICD-10-CM

## 2021-08-12 DIAGNOSIS — I998 Other disorder of circulatory system: Secondary | ICD-10-CM | POA: Diagnosis present

## 2021-08-12 DIAGNOSIS — K219 Gastro-esophageal reflux disease without esophagitis: Secondary | ICD-10-CM | POA: Diagnosis present

## 2021-08-12 DIAGNOSIS — I10 Essential (primary) hypertension: Secondary | ICD-10-CM | POA: Diagnosis present

## 2021-08-12 DIAGNOSIS — I82B12 Acute embolism and thrombosis of left subclavian vein: Secondary | ICD-10-CM | POA: Diagnosis present

## 2021-08-12 DIAGNOSIS — E1151 Type 2 diabetes mellitus with diabetic peripheral angiopathy without gangrene: Secondary | ICD-10-CM | POA: Diagnosis present

## 2021-08-12 DIAGNOSIS — Z8249 Family history of ischemic heart disease and other diseases of the circulatory system: Secondary | ICD-10-CM | POA: Diagnosis not present

## 2021-08-12 DIAGNOSIS — Z794 Long term (current) use of insulin: Secondary | ICD-10-CM | POA: Diagnosis not present

## 2021-08-12 DIAGNOSIS — E785 Hyperlipidemia, unspecified: Secondary | ICD-10-CM | POA: Diagnosis present

## 2021-08-12 DIAGNOSIS — Z888 Allergy status to other drugs, medicaments and biological substances status: Secondary | ICD-10-CM

## 2021-08-12 DIAGNOSIS — Z79899 Other long term (current) drug therapy: Secondary | ICD-10-CM | POA: Diagnosis not present

## 2021-08-12 DIAGNOSIS — I6523 Occlusion and stenosis of bilateral carotid arteries: Secondary | ICD-10-CM | POA: Diagnosis present

## 2021-08-12 DIAGNOSIS — I70228 Atherosclerosis of native arteries of extremities with rest pain, other extremity: Principal | ICD-10-CM | POA: Diagnosis present

## 2021-08-12 DIAGNOSIS — I6522 Occlusion and stenosis of left carotid artery: Secondary | ICD-10-CM

## 2021-08-12 HISTORY — PX: CAROTID-SUBCLAVIAN BYPASS GRAFT: SHX910

## 2021-08-12 LAB — CBC
HCT: 31.1 % — ABNORMAL LOW (ref 36.0–46.0)
Hemoglobin: 8.9 g/dL — ABNORMAL LOW (ref 12.0–15.0)
MCH: 22.4 pg — ABNORMAL LOW (ref 26.0–34.0)
MCHC: 28.6 g/dL — ABNORMAL LOW (ref 30.0–36.0)
MCV: 78.1 fL — ABNORMAL LOW (ref 80.0–100.0)
Platelets: 446 10*3/uL — ABNORMAL HIGH (ref 150–400)
RBC: 3.98 MIL/uL (ref 3.87–5.11)
RDW: 20.4 % — ABNORMAL HIGH (ref 11.5–15.5)
WBC: 11.9 10*3/uL — ABNORMAL HIGH (ref 4.0–10.5)
nRBC: 0 % (ref 0.0–0.2)

## 2021-08-12 LAB — CREATININE, SERUM
Creatinine, Ser: 0.93 mg/dL (ref 0.44–1.00)
GFR, Estimated: >60 mL/min (ref >60)

## 2021-08-12 LAB — GLUCOSE, CAPILLARY
Glucose-Capillary: 165 mg/dL — ABNORMAL HIGH (ref 70–99)
Glucose-Capillary: 157 mg/dL — ABNORMAL HIGH (ref 70–99)
Glucose-Capillary: 260 mg/dL — ABNORMAL HIGH (ref 70–99)
Glucose-Capillary: 193 mg/dL — ABNORMAL HIGH (ref 70–99)
Glucose-Capillary: 187 mg/dL — ABNORMAL HIGH (ref 70–99)

## 2021-08-12 LAB — POCT ACTIVATED CLOTTING TIME
Activated Clotting Time: 277 seconds
Activated Clotting Time: 248 seconds

## 2021-08-12 LAB — ABO/RH: ABO/RH(D): B POS

## 2021-08-12 SURGERY — CREATION, BYPASS, ARTERIAL, SUBCLAVIAN TO CAROTID, USING GRAFT
Anesthesia: General | Site: Neck | Laterality: Left

## 2021-08-12 MED ORDER — DEXAMETHASONE SODIUM PHOSPHATE 10 MG/ML IJ SOLN
INTRAMUSCULAR | Status: DC | PRN
  Administered 2021-08-12 (×2): 5 mg via INTRAVENOUS

## 2021-08-12 MED ORDER — INSULIN ASPART 100 UNIT/ML IJ SOLN: 0.0000 [IU] | Freq: Three times a day (TID) | INTRAMUSCULAR | Status: DC

## 2021-08-12 MED ORDER — LABETALOL HCL 5 MG/ML IV SOLN: 10.0000 mg | INTRAVENOUS | Status: DC | PRN

## 2021-08-12 MED ORDER — ACETAMINOPHEN 325 MG PO TABS: 325.0000 mg | ORAL_TABLET | ORAL | Status: DC | PRN

## 2021-08-12 MED ORDER — PHENOL 1.4 % MT LIQD: 1.0000 | OROMUCOSAL | Status: DC | PRN

## 2021-08-12 MED ORDER — VALACYCLOVIR HCL 500 MG PO TABS
500.0000 mg | ORAL_TABLET | Freq: Every morning | ORAL | Status: DC
  Administered 2021-08-13 – 2021-08-14 (×2): 500 mg via ORAL
  Filled 2021-08-12 (×3): qty 1

## 2021-08-12 MED ORDER — GUAIFENESIN-DM 100-10 MG/5ML PO SYRP: 15.0000 mL | ORAL_SOLUTION | ORAL | Status: DC | PRN

## 2021-08-12 MED ORDER — INSULIN ASPART 100 UNIT/ML FLEXPEN: 12.0000 [IU] | PEN_INJECTOR | Freq: Three times a day (TID) | SUBCUTANEOUS | Status: DC

## 2021-08-12 MED ORDER — HEPARIN 6000 UNIT IRRIGATION SOLUTION
Status: DC | PRN
  Administered 2021-08-12: 1

## 2021-08-12 MED ORDER — POTASSIUM CHLORIDE CRYS ER 20 MEQ PO TBCR: 20.0000 meq | EXTENDED_RELEASE_TABLET | Freq: Every day | ORAL | Status: DC | PRN

## 2021-08-12 MED ORDER — INSULIN ASPART 100 UNIT/ML IJ SOLN
INTRAMUSCULAR | Status: AC
  Administered 2021-08-12: 100 [IU]
  Filled 2021-08-12: qty 1

## 2021-08-12 MED ORDER — INSULIN ASPART 100 UNIT/ML IJ SOLN
5.0000 [IU] | Freq: Once | INTRAMUSCULAR | Status: AC
  Administered 2021-08-12: 5 [IU] via SUBCUTANEOUS

## 2021-08-12 MED ORDER — PHENYLEPHRINE HCL-NACL 20-0.9 MG/250ML-% IV SOLN
INTRAVENOUS | Status: DC | PRN
  Administered 2021-08-12: 50 ug/min via INTRAVENOUS

## 2021-08-12 MED ORDER — FENTANYL CITRATE (PF) 250 MCG/5ML IJ SOLN
INTRAMUSCULAR | Status: DC | PRN
  Administered 2021-08-12 (×3): 50 ug via INTRAVENOUS
  Administered 2021-08-12: 25 ug via INTRAVENOUS
  Administered 2021-08-12: 100 ug via INTRAVENOUS
  Administered 2021-08-12: 50 ug via INTRAVENOUS

## 2021-08-12 MED ORDER — CHLORHEXIDINE GLUCONATE CLOTH 2 % EX PADS
6.0000 | MEDICATED_PAD | Freq: Once | CUTANEOUS | Status: DC
  Administered 2021-08-12: 6 via TOPICAL

## 2021-08-12 MED ORDER — FERROUS SULFATE 325 (65 FE) MG PO TABS
325.0000 mg | ORAL_TABLET | Freq: Every day | ORAL | Status: DC
  Administered 2021-08-13 – 2021-08-14 (×2): 325 mg via ORAL
  Filled 2021-08-12 (×3): qty 1

## 2021-08-12 MED ORDER — ONDANSETRON HCL 4 MG/2ML IJ SOLN
INTRAMUSCULAR | Status: DC | PRN
  Administered 2021-08-12: 4 mg via INTRAVENOUS

## 2021-08-12 MED ORDER — INSULIN ASPART 100 UNIT/ML IJ SOLN: 0.0000 [IU] | Freq: Every day | INTRAMUSCULAR | Status: DC

## 2021-08-12 MED ORDER — SODIUM CHLORIDE 0.9 % IV SOLN: INTRAVENOUS | Status: DC

## 2021-08-12 MED ORDER — MORPHINE SULFATE (PF) 2 MG/ML IV SOLN: 2.0000 mg | INTRAVENOUS | Status: DC | PRN

## 2021-08-12 MED ORDER — CHLORHEXIDINE GLUCONATE 0.12 % MT SOLN
OROMUCOSAL | Status: AC
  Administered 2021-08-12: 15 mL
  Filled 2021-08-12: qty 15

## 2021-08-12 MED ORDER — EPHEDRINE SULFATE-NACL 50-0.9 MG/10ML-% IV SOSY
PREFILLED_SYRINGE | INTRAVENOUS | Status: DC | PRN
  Administered 2021-08-12 (×2): 5 mg via INTRAVENOUS

## 2021-08-12 MED ORDER — CEFAZOLIN SODIUM-DEXTROSE 2-4 GM/100ML-% IV SOLN
2.0000 g | INTRAVENOUS | Status: AC
  Administered 2021-08-12: 2 g via INTRAVENOUS
  Filled 2021-08-12: qty 100

## 2021-08-12 MED ORDER — 0.9 % SODIUM CHLORIDE (POUR BTL) OPTIME
TOPICAL | Status: DC | PRN
  Administered 2021-08-12: 2000 mL

## 2021-08-12 MED ORDER — HEPARIN SODIUM (PORCINE) 5000 UNIT/ML IJ SOLN
5000.0000 [IU] | Freq: Three times a day (TID) | INTRAMUSCULAR | Status: DC
  Administered 2021-08-13 – 2021-08-14 (×4): 5000 [IU] via SUBCUTANEOUS
  Filled 2021-08-12 (×4): qty 1

## 2021-08-12 MED ORDER — INSULIN ASPART 100 UNIT/ML FLEXPEN
12.0000 [IU] | PEN_INJECTOR | Freq: Three times a day (TID) | SUBCUTANEOUS | Status: DC
  Filled 2021-08-12: qty 3

## 2021-08-12 MED ORDER — ALBUMIN HUMAN 5 % IV SOLN
INTRAVENOUS | Status: DC | PRN
Start: 2021-08-12 — End: 2021-08-12

## 2021-08-12 MED ORDER — FENTANYL CITRATE (PF) 100 MCG/2ML IJ SOLN
INTRAMUSCULAR | Status: AC
  Filled 2021-08-12: qty 2

## 2021-08-12 MED ORDER — HEPARIN SODIUM (PORCINE) 1000 UNIT/ML IJ SOLN
INTRAMUSCULAR | Status: AC
  Filled 2021-08-12: qty 1

## 2021-08-12 MED ORDER — EMPAGLIFLOZIN 10 MG PO TABS
10.0000 mg | ORAL_TABLET | Freq: Every day | ORAL | Status: DC
  Administered 2021-08-13 – 2021-08-14 (×2): 10 mg via ORAL
  Filled 2021-08-12 (×3): qty 1

## 2021-08-12 MED ORDER — SITAGLIPTIN PHOS-METFORMIN HCL 50-1000 MG PO TABS: 1.0000 | ORAL_TABLET | Freq: Two times a day (BID) | ORAL | Status: DC

## 2021-08-12 MED ORDER — METFORMIN HCL 500 MG PO TABS
1000.0000 mg | ORAL_TABLET | Freq: Two times a day (BID) | ORAL | Status: DC
  Administered 2021-08-13 – 2021-08-14 (×3): 1000 mg via ORAL
  Filled 2021-08-12 (×3): qty 2

## 2021-08-12 MED ORDER — OXYCODONE HCL 5 MG/5ML PO SOLN: 5.0000 mg | Freq: Once | ORAL | Status: DC | PRN

## 2021-08-12 MED ORDER — FENTANYL CITRATE (PF) 100 MCG/2ML IJ SOLN
25.0000 ug | INTRAMUSCULAR | Status: DC | PRN
  Administered 2021-08-12: 25 ug via INTRAVENOUS

## 2021-08-12 MED ORDER — SODIUM CHLORIDE 0.9 % IV SOLN: INTRAVENOUS | Status: DC | PRN

## 2021-08-12 MED ORDER — INSULIN GLARGINE-YFGN 100 UNIT/ML ~~LOC~~ SOLN
40.0000 [IU] | Freq: Every day | SUBCUTANEOUS | Status: DC
  Administered 2021-08-12 – 2021-08-13 (×2): 40 [IU] via SUBCUTANEOUS
  Filled 2021-08-12 (×4): qty 0.4

## 2021-08-12 MED ORDER — FENTANYL CITRATE (PF) 250 MCG/5ML IJ SOLN
INTRAMUSCULAR | Status: AC
  Filled 2021-08-12: qty 5

## 2021-08-12 MED ORDER — PHENYLEPHRINE 40 MCG/ML (10ML) SYRINGE FOR IV PUSH (FOR BLOOD PRESSURE SUPPORT)
PREFILLED_SYRINGE | INTRAVENOUS | Status: DC | PRN
  Administered 2021-08-12: 8 ug via INTRAVENOUS

## 2021-08-12 MED ORDER — BISACODYL 5 MG PO TBEC: 5.0000 mg | DELAYED_RELEASE_TABLET | Freq: Every day | ORAL | Status: DC | PRN

## 2021-08-12 MED ORDER — MAGNESIUM SULFATE 2 GM/50ML IV SOLN
2.0000 g | Freq: Every day | INTRAVENOUS | Status: DC | PRN
Start: 2021-08-12 — End: 2021-08-14

## 2021-08-12 MED ORDER — OXYCODONE-ACETAMINOPHEN 5-325 MG PO TABS
1.0000 | ORAL_TABLET | ORAL | Status: DC | PRN
  Administered 2021-08-12: 1 via ORAL
  Administered 2021-08-12 – 2021-08-13 (×2): 2 via ORAL
  Administered 2021-08-13: 1 via ORAL
  Administered 2021-08-13: 2 via ORAL
  Administered 2021-08-13: 1 via ORAL
  Administered 2021-08-14: 2 via ORAL
  Filled 2021-08-12 (×3): qty 2
  Filled 2021-08-12: qty 1
  Filled 2021-08-12 (×4): qty 2

## 2021-08-12 MED ORDER — ADULT MULTIVITAMIN W/MINERALS CH
1.0000 | ORAL_TABLET | Freq: Every day | ORAL | Status: DC
  Administered 2021-08-12 – 2021-08-14 (×3): 1 via ORAL
  Filled 2021-08-12 (×3): qty 1

## 2021-08-12 MED ORDER — INSULIN ASPART 100 UNIT/ML IJ SOLN
0.0000 [IU] | Freq: Three times a day (TID) | INTRAMUSCULAR | Status: DC
  Administered 2021-08-13: 3 [IU] via SUBCUTANEOUS
  Administered 2021-08-13: 5 [IU] via SUBCUTANEOUS
  Administered 2021-08-14: 3 [IU] via SUBCUTANEOUS

## 2021-08-12 MED ORDER — DOCUSATE SODIUM 100 MG PO CAPS
100.0000 mg | ORAL_CAPSULE | Freq: Every day | ORAL | Status: DC
  Administered 2021-08-13 – 2021-08-14 (×2): 100 mg via ORAL
  Filled 2021-08-12 (×2): qty 1

## 2021-08-12 MED ORDER — OXYCODONE HCL 5 MG PO TABS: 5.0000 mg | ORAL_TABLET | Freq: Once | ORAL | Status: DC | PRN

## 2021-08-12 MED ORDER — LIDOCAINE 2% (20 MG/ML) 5 ML SYRINGE
INTRAMUSCULAR | Status: DC | PRN
  Administered 2021-08-12: 60 mg via INTRAVENOUS

## 2021-08-12 MED ORDER — AMISULPRIDE (ANTIEMETIC) 5 MG/2ML IV SOLN: 5.0000 mg | Freq: Once | INTRAVENOUS | Status: DC | PRN

## 2021-08-12 MED ORDER — SCOPOLAMINE 1 MG/3DAYS TD PT72
MEDICATED_PATCH | TRANSDERMAL | Status: DC | PRN
  Administered 2021-08-12: 1 via TRANSDERMAL

## 2021-08-12 MED ORDER — GABAPENTIN 300 MG PO CAPS
300.0000 mg | ORAL_CAPSULE | Freq: Three times a day (TID) | ORAL | Status: DC
  Administered 2021-08-12 – 2021-08-14 (×6): 300 mg via ORAL
  Filled 2021-08-12 (×6): qty 1

## 2021-08-12 MED ORDER — ROCURONIUM BROMIDE 10 MG/ML (PF) SYRINGE
PREFILLED_SYRINGE | INTRAVENOUS | Status: AC
  Filled 2021-08-12: qty 20

## 2021-08-12 MED ORDER — PROPOFOL 10 MG/ML IV BOLUS
INTRAVENOUS | Status: DC | PRN
  Administered 2021-08-12: 200 mg via INTRAVENOUS

## 2021-08-12 MED ORDER — PANTOPRAZOLE SODIUM 40 MG PO TBEC
40.0000 mg | DELAYED_RELEASE_TABLET | Freq: Every day | ORAL | Status: DC
  Administered 2021-08-12 – 2021-08-14 (×3): 40 mg via ORAL
  Filled 2021-08-12 (×3): qty 1

## 2021-08-12 MED ORDER — AMLODIPINE BESYLATE 5 MG PO TABS
5.0000 mg | ORAL_TABLET | Freq: Every day | ORAL | Status: DC
  Administered 2021-08-14: 5 mg via ORAL
  Filled 2021-08-12 (×3): qty 1

## 2021-08-12 MED ORDER — INSULIN ASPART 100 UNIT/ML IJ SOLN
12.0000 [IU] | Freq: Three times a day (TID) | INTRAMUSCULAR | Status: DC
  Administered 2021-08-13 – 2021-08-14 (×4): 12 [IU] via SUBCUTANEOUS

## 2021-08-12 MED ORDER — ATORVASTATIN CALCIUM 10 MG PO TABS
10.0000 mg | ORAL_TABLET | Freq: Every day | ORAL | Status: DC
  Administered 2021-08-12: 10 mg via ORAL
  Filled 2021-08-12: qty 1

## 2021-08-12 MED ORDER — HYDRALAZINE HCL 20 MG/ML IJ SOLN
5.0000 mg | INTRAMUSCULAR | Status: DC | PRN
Start: 2021-08-12 — End: 2021-08-14

## 2021-08-12 MED ORDER — CEFAZOLIN SODIUM-DEXTROSE 2-4 GM/100ML-% IV SOLN
2.0000 g | Freq: Three times a day (TID) | INTRAVENOUS | Status: AC
Start: 2021-08-12 — End: 2021-08-13
  Administered 2021-08-12 – 2021-08-13 (×2): 2 g via INTRAVENOUS
  Filled 2021-08-12 (×2): qty 100

## 2021-08-12 MED ORDER — HEPARIN SODIUM (PORCINE) 1000 UNIT/ML IJ SOLN
INTRAMUSCULAR | Status: DC | PRN
  Administered 2021-08-12: 9000 [IU] via INTRAVENOUS
  Administered 2021-08-12: 2000 [IU] via INTRAVENOUS

## 2021-08-12 MED ORDER — SUGAMMADEX SODIUM 200 MG/2ML IV SOLN
INTRAVENOUS | Status: DC | PRN
  Administered 2021-08-12: 200 mg via INTRAVENOUS

## 2021-08-12 MED ORDER — SCOPOLAMINE 1 MG/3DAYS TD PT72
MEDICATED_PATCH | TRANSDERMAL | Status: AC
  Filled 2021-08-12: qty 1

## 2021-08-12 MED ORDER — POLYETHYLENE GLYCOL 3350 17 G PO PACK: 17.0000 g | PACK | Freq: Every day | ORAL | Status: DC | PRN

## 2021-08-12 MED ORDER — LINAGLIPTIN 5 MG PO TABS
5.0000 mg | ORAL_TABLET | Freq: Every day | ORAL | Status: DC
  Administered 2021-08-13 – 2021-08-14 (×2): 5 mg via ORAL
  Filled 2021-08-12: qty 1

## 2021-08-12 MED ORDER — ACETAMINOPHEN 650 MG RE SUPP: 325.0000 mg | RECTAL | Status: DC | PRN

## 2021-08-12 MED ORDER — ASPIRIN EC 81 MG PO TBEC
81.0000 mg | DELAYED_RELEASE_TABLET | Freq: Every day | ORAL | Status: DC
  Administered 2021-08-12 – 2021-08-14 (×3): 81 mg via ORAL
  Filled 2021-08-12 (×3): qty 1

## 2021-08-12 MED ORDER — PROTAMINE SULFATE 10 MG/ML IV SOLN
INTRAVENOUS | Status: DC | PRN
  Administered 2021-08-12 (×2): 20 mg via INTRAVENOUS

## 2021-08-12 MED ORDER — ALUM & MAG HYDROXIDE-SIMETH 200-200-20 MG/5ML PO SUSP: 15.0000 mL | ORAL | Status: DC | PRN

## 2021-08-12 MED ORDER — METOPROLOL TARTRATE 5 MG/5ML IV SOLN: 2.0000 mg | INTRAVENOUS | Status: DC | PRN

## 2021-08-12 MED ORDER — ACETAMINOPHEN 10 MG/ML IV SOLN: 1000.0000 mg | Freq: Once | INTRAVENOUS | Status: DC | PRN

## 2021-08-12 MED ORDER — ONDANSETRON HCL 4 MG/2ML IJ SOLN
4.0000 mg | Freq: Four times a day (QID) | INTRAMUSCULAR | Status: DC | PRN
  Filled 2021-08-12: qty 2

## 2021-08-12 MED ORDER — MIDAZOLAM HCL 5 MG/5ML IJ SOLN
INTRAMUSCULAR | Status: DC | PRN
  Administered 2021-08-12: 2 mg via INTRAVENOUS

## 2021-08-12 MED ORDER — VITAMIN D3 25 MCG (1000 UNIT) PO TABS
5000.0000 [IU] | ORAL_TABLET | Freq: Every morning | ORAL | Status: DC
  Administered 2021-08-13 – 2021-08-14 (×2): 5000 [IU] via ORAL
  Filled 2021-08-12 (×4): qty 5

## 2021-08-12 MED ORDER — MIDAZOLAM HCL 2 MG/2ML IJ SOLN
INTRAMUSCULAR | Status: AC
  Filled 2021-08-12: qty 2

## 2021-08-12 MED ORDER — SODIUM CHLORIDE 0.9 % IV SOLN: 500.0000 mL | Freq: Once | INTRAVENOUS | Status: DC | PRN

## 2021-08-12 MED ORDER — ROCURONIUM BROMIDE 10 MG/ML (PF) SYRINGE
PREFILLED_SYRINGE | INTRAVENOUS | Status: DC | PRN
  Administered 2021-08-12: 30 mg via INTRAVENOUS
  Administered 2021-08-12: 40 mg via INTRAVENOUS
  Administered 2021-08-12: 30 mg via INTRAVENOUS
  Administered 2021-08-12 (×2): 20 mg via INTRAVENOUS
  Administered 2021-08-12: 10 mg via INTRAVENOUS
  Administered 2021-08-12: 30 mg via INTRAVENOUS

## 2021-08-12 SURGICAL SUPPLY — 61 items
ADH SKN CLS APL DERMABOND .7 (GAUZE/BANDAGES/DRESSINGS) ×3
AGENT HMST SPONGE THK3/8 (HEMOSTASIS)
BAG COUNTER SPONGE SURGICOUNT (BAG) ×2 IMPLANT
BAG SPNG CNTER NS LX DISP (BAG) ×1
CANISTER SUCT 3000ML PPV (MISCELLANEOUS) ×2 IMPLANT
CATH ROBINSON RED A/P 18FR (CATHETERS) ×1 IMPLANT
CLIP TI MEDIUM 24 (CLIP) ×1 IMPLANT
CLIP TI MEDIUM 6 (CLIP) IMPLANT
CLIP TI WIDE RED SMALL 24 (CLIP) ×1 IMPLANT
CLIP VESOCCLUDE MED 24/CT (CLIP) ×2 IMPLANT
CLIP VESOCCLUDE SM WIDE 24/CT (CLIP) ×2 IMPLANT
COVER SURGICAL LIGHT HANDLE (MISCELLANEOUS) ×1 IMPLANT
DERMABOND ADVANCED (GAUZE/BANDAGES/DRESSINGS) ×3
DERMABOND ADVANCED .7 DNX12 (GAUZE/BANDAGES/DRESSINGS) ×1 IMPLANT
DRAIN CHANNEL 15F RND FF W/TCR (WOUND CARE) IMPLANT
DRAPE INCISE IOBAN 66X45 STRL (DRAPES) ×4 IMPLANT
DRAPE ORTHO SPLIT 77X108 STRL (DRAPES) ×2
DRAPE SURG ORHT 6 SPLT 77X108 (DRAPES) IMPLANT
DRAPE WARM FLUID 44X44 (DRAPES) ×1 IMPLANT
ELECT REM PT RETURN 9FT ADLT (ELECTROSURGICAL) ×2
ELECTRODE REM PT RTRN 9FT ADLT (ELECTROSURGICAL) ×1 IMPLANT
EVACUATOR SILICONE 100CC (DRAIN) IMPLANT
GAUZE 4X4 16PLY ~~LOC~~+RFID DBL (SPONGE) ×1 IMPLANT
GAUZE SPONGE 4X4 12PLY STRL (GAUZE/BANDAGES/DRESSINGS) ×1 IMPLANT
GLOVE SRG 8 PF TXTR STRL LF DI (GLOVE) ×1 IMPLANT
GLOVE SURG ENC MOIS LTX SZ7.5 (GLOVE) ×2 IMPLANT
GLOVE SURG UNDER POLY LF SZ6.5 (GLOVE) ×3 IMPLANT
GLOVE SURG UNDER POLY LF SZ8 (GLOVE) ×2
GOWN STRL REUS W/ TWL LRG LVL3 (GOWN DISPOSABLE) ×3 IMPLANT
GOWN STRL REUS W/ TWL XL LVL3 (GOWN DISPOSABLE) ×1 IMPLANT
GOWN STRL REUS W/TWL LRG LVL3 (GOWN DISPOSABLE) ×8
GOWN STRL REUS W/TWL XL LVL3 (GOWN DISPOSABLE) ×2
HEMOSTAT SPONGE AVITENE ULTRA (HEMOSTASIS) IMPLANT
INSERT FOGARTY SM (MISCELLANEOUS) ×6 IMPLANT
KIT BASIN OR (CUSTOM PROCEDURE TRAY) ×2 IMPLANT
KIT DRAIN CSF ACCUDRAIN (MISCELLANEOUS) IMPLANT
KIT TURNOVER KIT B (KITS) ×2 IMPLANT
LOOP VESSEL MAXI BLUE (MISCELLANEOUS) ×1 IMPLANT
LOOP VESSEL MINI RED (MISCELLANEOUS) ×1 IMPLANT
MARKER SKIN DUAL TIP RULER LAB (MISCELLANEOUS) ×2 IMPLANT
NS IRRIG 1000ML POUR BTL (IV SOLUTION) ×4 IMPLANT
PACK CAROTID (CUSTOM PROCEDURE TRAY) ×2 IMPLANT
PAD ARMBOARD 7.5X6 YLW CONV (MISCELLANEOUS) ×4 IMPLANT
PENCIL BUTTON HOLSTER BLD 10FT (ELECTRODE) ×1 IMPLANT
POSITIONER HEAD DONUT 9IN (MISCELLANEOUS) ×2 IMPLANT
SPONGE T-LAP 18X18 ~~LOC~~+RFID (SPONGE) ×1 IMPLANT
SUT ETHILON 3 0 PS 1 (SUTURE) IMPLANT
SUT MNCRL AB 4-0 PS2 18 (SUTURE) ×6 IMPLANT
SUT PROLENE 5 0 C 1 24 (SUTURE) ×4 IMPLANT
SUT PROLENE 6 0 BV (SUTURE) ×8 IMPLANT
SUT PROLENE 6 0 CC (SUTURE) ×2 IMPLANT
SUT PROLENE 7 0 BV 1 (SUTURE) IMPLANT
SUT VIC AB 2-0 CT1 27 (SUTURE) ×6
SUT VIC AB 2-0 CT1 36 (SUTURE) ×1 IMPLANT
SUT VIC AB 2-0 CT1 TAPERPNT 27 (SUTURE) ×1 IMPLANT
SUT VIC AB 3-0 SH 27 (SUTURE) ×10
SUT VIC AB 3-0 SH 27X BRD (SUTURE) IMPLANT
SUT VIC AB 3-0 SH 27XBRD (SUTURE) ×1 IMPLANT
SYR TB 1ML LUER SLIP (SYRINGE) IMPLANT
TOWEL GREEN STERILE (TOWEL DISPOSABLE) ×2 IMPLANT
WATER STERILE IRR 1000ML POUR (IV SOLUTION) ×2 IMPLANT

## 2021-08-12 NOTE — Anesthesia Postprocedure Evaluation (Signed)
Anesthesia Post Note  Patient: Meagan [name]  Procedure(s) Performed: LEFT CAROTID-BRACHIAL ARTERY BYPASS using Left greater saphenous Vein, harvested from left leg. (Left: Neck)     Patient location during evaluation: PACU Anesthesia Type: General Level of consciousness: awake Pain management: pain level controlled Vital Signs Assessment: post-procedure vital signs reviewed and stable Respiratory status: spontaneous breathing, nonlabored ventilation, respiratory function stable and patient connected to nasal cannula oxygen Cardiovascular status: blood pressure returned to baseline and stable Postop Assessment: no apparent nausea or vomiting Anesthetic complications: no   No notable events documented.  Last Vitals:  Vitals:   08/12/21 1641 08/12/21 1926  BP: (!) 115/36 (!) 106/45  Pulse: 99 98  Resp: 19 17  Temp: 36.6 C 36.6 C  SpO2: 100% 99%    Last Pain:  Vitals:   08/12/21 1926  TempSrc: Oral  PainSc:                  Goodwin Kamphaus P Hannahgrace Lalli

## 2021-08-12 NOTE — Transfer of Care (Signed)
Immediate Anesthesia Transfer of Care Note  Patient: Meagan [name]  Procedure(s) Performed: LEFT CAROTID-BRACHIAL ARTERY BYPASS using Left greater saphenous Vein, harvested from left leg. (Left: Neck)  Patient Location: PACU  Anesthesia Type:General  Level of Consciousness: drowsy, patient cooperative and responds to stimulation  Airway & Oxygen Therapy: Patient Spontanous Breathing and Patient connected to face mask oxygen  Post-op Assessment: Report given to RN, Post -op Vital signs reviewed and stable and Patient moving all extremities X 4  Post vital signs: Reviewed and stable  Last Vitals:  Vitals Value Taken Time  BP 105/39 08/12/21 1417  Temp    Pulse 116 08/12/21 1418  Resp    SpO2 96 % 08/12/21 1418  Vitals shown include unvalidated device data.  Last Pain:  Vitals:   08/12/21 0821  TempSrc:   PainSc: 5          Complications: No notable events documented.

## 2021-08-12 NOTE — Anesthesia Procedure Notes (Signed)
Procedure Name: Intubation Date/Time: 08/12/2021 10:37 AM Performed by: Wilburn Cornelia, CRNA Pre-anesthesia Checklist: Patient identified, Emergency Drugs available, Suction available and Patient being monitored Patient Re-evaluated:Patient Re-evaluated prior to induction Oxygen Delivery Method: Circle System Utilized Preoxygenation: Pre-oxygenation with 100% oxygen Induction Type: IV induction Ventilation: Mask ventilation without difficulty and Oral airway inserted - appropriate to patient size Laryngoscope Size: Glidescope and 4 Grade View: Grade II Tube type: Oral Tube size: 7.0 mm Number of attempts: 1 Airway Equipment and Method: Stylet and Oral airway Placement Confirmation: ETT inserted through vocal cords under direct vision, positive ETCO2 and breath sounds checked- equal and bilateral Secured at: 22 cm Tube secured with: Tape Dental Injury: Teeth and Oropharynx as per pre-operative assessment  Difficulty Due To: Difficulty was unanticipated Comments: Merrilyn Puma CRNA attempted DL with MAC 3 with grade 4 view; Glidescope 4 used with grade 2 view with + ETCO2, chest rise and visualization through the cords. Taped at 22 at lip

## 2021-08-12 NOTE — Progress Notes (Signed)
Pt arrived from PACU, VSS, CHG complete, orders released, oriented to unit, call light within reach, incisions clean dry and intact  Chrisandra Carota, RN 08/12/2021 4:50 PM

## 2021-08-12 NOTE — Op Note (Signed)
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  Harvest of left leg great saphenous vein 2.  Left common carotid to brachial artery bypass with reversed left leg great saphenous vein  Surgeon: Dr. Marty Heck, MD  Assistant: Dr. Amil Amen, MD and Risa Grill, PA  Indications: Patient is a 60 year old female who was seen in the office initially for consultation for carotid disease and peripheral vascular disease.  It was discovered that she had significant tissue loss to the left upper extremity related to a hand infection after a pedicure in early February and this had been non-healing.  She had no palpable pulses in the left upper extremity and she underwent arteriogram that showed left subclavian as well as axillary artery occlusion.  Based on the images we recommended a left carotid to brachial artery bypass.  She presents today after risks benefits discussed.  Findings: The great saphenous vein was harvested in the left thigh from the saphenofemoral junction to just below the knee and this was of good caliber.  This was then sewn to the common carotid artery on the left just above the clavicle where it was soft with an end to side anastomosis after spatulating the vein in reversed fashion.  I then tunneled this under the clavicle with a counterincision infraclavicular and then through the axillary artery exposure to the brachial artery in the mid upper arm where the artery appeared to reconstitute and look healthy on arteriogram.  A end to side anastomosis was sewn to the left brachial artery in mid upper arm.  She had a palpable left radial pulse at completion.  Anesthesia: General  Details: Patient was taken to the operating room after informed consent was obtained.  Placed on the operating table in supine  position.  General endotracheal anesthesia was induced.  I initially used SonoSite and the saphenous vein was marked in the left thigh from the saphenofemoral junction to just below the knee where it looked to be of good caliber.  The left arm, left neck, and left leg were then prepped and draped in usual sterile fashion.  Antibiotics were given.  Timeout was performed.  Initially made a transverse incision 1 fingerbreadth above the clavicle over the sternocleidomastoid on the left neck.  Dissected down and opened platysma with Bovie cautery.  The clavicular head of the sternocleidomastoid was partially divided with Bovie cautery.  I then used cerebellar retractors and mobilized the jugular vein and vagus nerve medial until I identified the left common carotid artery that was controlled with Vesseloops.  I then went ahead and dissected out the brachial artery in the mid upper left arm where this appeared to reconstitute on arteriogram.  Longitudinal incision was made over the left brachial artery and I used retractors for added visualization and opened subcutaneous tissue and the fascia to gain exposure to the brachial artery that was controlled proximally distally.  This was nice and soft although it was quite small and underfilled.  While I was doing this Dr. Unk Lightning made two skip incisions in the left thigh and circumferentially dissected out the great saphenous vein and all side branches were ligated between 3-0 silk ties small clips and divided.  I went ahead and made a third incision infraclavicular over the axillary artery on the left chest wall.  Dissected down with Bovie cautery open the fascia and  the pectoralis major was divided with a muscle-sparing technique and I could visualize pectoralis minor under the incision.  I then bluntly made a tunnel under pectoralis minor under the clavicle to the common carotid artery exposure and then another tunnel from the brachial artery exposure up through the axilla  to our axillary artery exposure.  At that point time we finished harvesting saphenous vein after we had a long segment and this was ligated with 2-0 silk ties at the saphenofemoral junction all the way down to the proximal calf.  The vein was passed off the field reversed and pressurized after vessel cannula was placed.  We had to ligate several branches with 4-0 silk ties over a mosquito clamp.  Also repaired one branch with a 6-0 Prolene.  At that point time the patient was given 100 units/kg IV heparin.  An ACT was checked to maintain greater than 250.  Once we had a therapeutic ACT, I clamped the common carotid proximal and distal in our exposure just above the clavicle with baby profunda clamps.  The common carotid was opened with 11 blade scalpel extended with Potts scissors.  The vein was then brought on the field and in  non-reversed fashion I spatulated and an end-to-side anastomosis was sewn to the left common carotid artery 5-0 Prolene parachute technique.  We had good hemostasis at completion and we had excellent pulsatile flow through the bypass.  I did listen with a pencil Doppler distally and had good flow in the common carotid distal to the bypass.  I then marked the vein for orientation after placing several medium clips on it.  This was then carefully tunneled over the fat pad and under the clavicle into the axillary artery exposure to the infraclavicular incision.  This was with a atraumatic sponge stick clamp.  I then tunneled additionally from this axillary exposure all the way down to the brachial artery making sure not to twist the bypass.  We made sure we had good one-to-one tension.  I then clamped the brachial artery with Vesseloops and opened this with 11 blade scalpel and Potts scissors.  The bypass was then controlled with a fistula clamp in the neck incision.  We had good length of vein and this was spatulated and a end to side anastomosis was sewn to the left brachial artery in the mid  upper arm.  We de-aired everything prior to completion.  We then had a palpable radial pulse at the wrist.  I did have to place several repair stitches in the distal anastomosis on the brachial artery with 6-0 Prolene.  Satisfied with the results protamine was given for reversal.  All the incisions were irrigated out.  We used multiple layers of 2-0 Vicryl 3-0 Vicryl 4-0 Monocryl and dermabond in he neck incisions as well as the arm incision.  The saphenous vein harvest incisions were closed with 3-0 Vicryl 4-0 Monocryl and Dermabond.  Awakened with no deficits and taken to recovery in stable condition.  Complication: None  Condition: Stable  Marty Heck, MD [facility] Office: [place]

## 2021-08-12 NOTE — H&P (Signed)
History and Physical Interval Note:  08/12/2021 10:08 AM  Meagan [name]  has presented today for surgery, with the diagnosis of Carotid Artery Stenosis.  The various methods of treatment have been discussed with the patient and family. After consideration of risks, benefits and other options for treatment, the patient has consented to  Procedure(s): LEFT CAROTID-BRACHIAL ARTERY BYPASS (Left) as a surgical intervention.  The patient's history has been reviewed, patient examined, no change in status, stable for surgery.  I have reviewed the patient's chart and labs.  Questions were answered to the patient's satisfaction.    Left carotid brachial artery bypass  Marty Heck  REASON FOR CONSULT: Evaluate carotid stenosis and PVD   HPI: Meagan [name] is a 60 y.o. female, with history of hypertension, hyperlipidemia, diabetes that presents for evaluation of carotid artery disease as well as PAD.  As it relates to her carotid artery disease she denies any history of TIAs or strokes.  She states her PCP heard a bruit that prompted the carotid ultrasound.  This showed 40 to 59% stenosis in the right ICA with a 60 to 79% stenosis in the left ICA.   She also complains of bilateral calf cramping after about 250 yards.  She has no rest pain or tissue loss.  ABIs were obtained on 07/01/2021 that showed dampened monophasic flow at the ankle.  She was also noted to have severely dampened brachial artery flow.   She has wounds on the left hand that she states is related to a hand infection after a pedicure earlier this year in February.  She states she is being followed by hand surgery.          Past Medical History:  Diagnosis Date   Diabetes mellitus ([place])     GERD (gastroesophageal reflux disease)     Hyperlipidemia     Hypertension                 Past Surgical History:  Procedure Laterality Date   CARPAL TUNNEL RELEASE Left 01/31/2021   CARPAL TUNNEL RELEASE Right 2021    Dr.  Mardelle Matte   DILATION AND CURETTAGE OF UTERUS   2007   ROTATOR CUFF REPAIR Right 2018    Dr. Mardelle Matte   TUBAL LIGATION   1992               Family History  Problem Relation Age of Onset   Kidney failure Mother     Liver disease Mother     Early death Father     Hypertension Brother     Colon cancer Neg Hx     Stomach cancer Neg Hx        SOCIAL HISTORY: Social History             Socioeconomic History   Marital status: Single      Spouse name: Not on file   Number of children: Not on file   Years of education: Not on file   Highest education level: Not on file  Occupational History   Not on file  Tobacco Use   Smoking status: Never   Smokeless tobacco: Never  Vaping Use   Vaping Use: Never used  Substance and Sexual Activity   Alcohol use: Not Currently      Comment: occasional   Drug use: Never   Sexual activity: Not on file  Other Topics Concern   Not on file  Social History Narrative   Not on file  Social Determinants of Health    Financial Resource Strain: Not on file  Food Insecurity: Not on file  Transportation Needs: Not on file  Physical Activity: Not on file  Stress: Not on file  Social Connections: Not on file  Intimate Partner Violence: Not on file               Allergies  Allergen Reactions   Promethazine Hcl Other (See Comments)   Lisinopril Itching and Swelling   Other     Phenothiazines Other (See Comments)      Bells palsy Bells palsy                 Current Outpatient Medications  Medication Sig Dispense Refill   amLODipine (NORVASC) 5 MG tablet TAKE 1 TABLET BY MOUTH EVERY DAY 90 tablet 1   atorvastatin (LIPITOR) 10 MG tablet Take 1 tablet (10 mg total) by mouth daily. 90 tablet 1   Blood Glucose Monitoring Suppl ([place]) w/Device KIT Use as directed to check blood sugars 2 times per day dx: e11.65 1 kit 1   ferrous sulfate 325 (65 FE) MG tablet Take 1 tablet (325 mg total) by mouth daily with breakfast. 30  tablet 3   gabapentin (NEURONTIN) 300 MG capsule Take 1 capsule (300 mg total) by mouth 3 (three) times daily. 90 capsule 2   glucose blood (ONETOUCH VERIO) test strip USE TO CHECK BLOOD SUGARS TWICE A DAY  DX:E11.65 100 each 12   insulin aspart (NOVOLOG FLEXPEN) 100 UNIT/ML FlexPen Inject 12 units 3 times per day with meals not to exceed 50 units 15 pen 1   Insulin Pen Needle (B-D ULTRAFINE III SHORT PEN) 31G X 8 MM MISC Use as directed with insulin pen 100 each 3   JANUMET 50-1000 MG tablet TAKE 1 TABLET BY MOUTH TWICE A DAY 180 tablet 1   JARDIANCE 10 MG TABS tablet TAKE 1 TABLET BY MOUTH EVERY DAY IN THE MORNING 90 tablet 1   MAGNESIUM PO Take by mouth. Take 4 tablets d       Multiple Vitamins-Minerals (MULTIVITAMIN WITH MINERALS) tablet Take 1 tablet by mouth daily.       PRILOSEC OTC 20 MG tablet Take 20 mg by mouth daily.        sertraline (ZOLOFT) 50 MG tablet TAKE 1 TABLET BY MOUTH EVERY DAY 90 tablet 0   TRESIBA FLEXTOUCH 200 UNIT/ML FlexTouch Pen INJECT 66 UNITS SQ DAILY 9 mL 1   albuterol (PROAIR HFA) 108 (90 Base) MCG/ACT inhaler Inhale 1-2 puffs into the lungs every 6 (six) hours as needed for wheezing or shortness of breath. (Patient not taking: No sig reported) 1 Inhaler 0   Cholecalciferol (DIALYVITE VITAMIN D 5000 PO) Take 2 capsules by mouth daily. (Patient not taking: Reported on 07/16/2021)        No current facility-administered medications for this visit.      REVIEW OF SYSTEMS:  '[X]'  denotes positive finding, '[ ]'  denotes negative finding Cardiac   Comments:  Chest pain or chest pressure:      Shortness of breath upon exertion:      Short of breath when lying flat:      Irregular heart rhythm:             Vascular      Pain in calf, thigh, or hip brought on by ambulation:      Pain in feet at night that wakes you up from your sleep:  Blood clot in your veins:      Leg swelling:              Pulmonary      Oxygen at home:      Productive cough:        Wheezing:              Neurologic      Sudden weakness in arms or legs:       Sudden numbness in arms or legs:       Sudden onset of difficulty speaking or slurred speech:      Temporary loss of vision in one eye:       Problems with dizziness:              Gastrointestinal      Blood in stool:       Vomited blood:              Genitourinary      Burning when urinating:       Blood in urine:             Psychiatric      Major depression:              Hematologic      Bleeding problems:      Problems with blood clotting too easily:             Skin      Rashes or ulcers:             Constitutional      Fever or chills:          PHYSICAL EXAM:        Vitals:    07/16/21 1546 07/16/21 1557  BP: 100/64 92/61  Pulse: 88 90  Resp: 14    Temp: 97.6 F (36.4 C)    TempSrc: Temporal    SpO2: 98%    Weight: 178 lb (80.7 kg)    Height: '5\' 3"'  (1.6 m)        GENERAL: The patient is a well-nourished female, in no acute distress. The vital signs are documented above. CARDIAC: There is a regular rate and rhythm.  VASCULAR:  Left upper extremity tissue loss pictured below with no palpable radial ulnar or brachial pulse but I can palpate a left subclavian pulse Bilateral femoral pulses palpable No palpable pedal pulses No lower extremity tissue loss PULMONARY: There is good air exchange bilaterally without wheezing or rales. ABDOMEN: Soft and non-tender MUSCULOSKELETAL: There are no major deformities or cyanosis. NEUROLOGIC: No focal weakness or paresthesias are detected. PSYCHIATRIC: The patient has a normal affect.       DATA:    Carotid ultrasound shows 40 to 59% right ICA stenosis with a 60 to 79% left ICA stenosis   ABIs are noncompressible with dampened monophasic waveforms bilaterally -also noted to have severely dampened monophasic flow in the brachial arteries   Assessment/Plan:   60 year old female presents for evaluation of carotid artery disease and  peripheral vascular disease.  As it relates to her carotid artery disease this is asymptomatic and the highest grade stenosis is on the left which is 60 to 79% in the ICA.  I discussed in the setting of asymptomatic disease would reserve surgical intervention for greater than 80% stenosis for stroke risk reduction.  I will plan to see her again in 6 months with carotid ultrasound.   Her lower extremity symptoms are consistent with claudication.  She is able to walk about 250 yards without stopping.  I do not consider this lifestyle limiting and recommend medical therapy as I discussed with her today.   I think her biggest issue is her left hand tissue loss as pictured above.  She states this is all related to a hand infection that started in February after a pedicure.  She states she is being followed by hand surgery. I cannot palpate a brachial radial or ulnar pulse.  She has tissue necrosis and I think she would benefit from left upper extremity arteriogram with possible intervention to give her the best chance of healing these wounds.  Risk benefits discussed.  We got her scheduled today.     Marty Heck, MD [facility] Office: [phone]

## 2021-08-12 NOTE — Anesthesia Procedure Notes (Signed)
Arterial Line Insertion Start/End9/10/2021 9:30 AM, 08/12/2021 9:45 AM Performed by: Murvin Natal, MD, anesthesiologist  Patient location: Pre-op. Preanesthetic checklist: patient identified, IV checked, site marked, risks and benefits discussed, surgical consent, monitors and equipment checked, pre-op evaluation, timeout performed and anesthesia consent Lidocaine 1% used for infiltration Right, radial was placed Catheter size: 20 G Hand hygiene performed , maximum sterile barriers used  and Seldinger technique used  Attempts: 1 Procedure performed using ultrasound guided technique. Ultrasound Notes:anatomy identified, needle tip was noted to be adjacent to the nerve/plexus identified and no ultrasound evidence of intravascular and/or intraneural injection Following insertion, dressing applied and Biopatch. Post procedure assessment: normal and unchanged  Patient tolerated the procedure well with no immediate complications.

## 2021-08-13 ENCOUNTER — Encounter (HOSPITAL_COMMUNITY): Payer: Self-pay | Admitting: Vascular Surgery

## 2021-08-13 DIAGNOSIS — E785 Hyperlipidemia, unspecified: Secondary | ICD-10-CM

## 2021-08-13 DIAGNOSIS — E1151 Type 2 diabetes mellitus with diabetic peripheral angiopathy without gangrene: Secondary | ICD-10-CM

## 2021-08-13 DIAGNOSIS — Z95828 Presence of other vascular implants and grafts: Secondary | ICD-10-CM

## 2021-08-13 DIAGNOSIS — I10 Essential (primary) hypertension: Secondary | ICD-10-CM

## 2021-08-13 LAB — GLUCOSE, CAPILLARY
Glucose-Capillary: 245 mg/dL — ABNORMAL HIGH (ref 70–99)
Glucose-Capillary: 183 mg/dL — ABNORMAL HIGH (ref 70–99)
Glucose-Capillary: 98 mg/dL (ref 70–99)
Glucose-Capillary: 180 mg/dL — ABNORMAL HIGH (ref 70–99)

## 2021-08-13 LAB — LIPID PANEL
Cholesterol: 141 mg/dL (ref 0–200)
HDL: 38 mg/dL — ABNORMAL LOW (ref >40)
LDL Cholesterol: 90 mg/dL (ref 0–99)
Total CHOL/HDL Ratio: 3.7 RATIO
Triglycerides: 67 mg/dL (ref <150)
VLDL: 13 mg/dL (ref 0–40)

## 2021-08-13 LAB — CBC
HCT: 27.9 % — ABNORMAL LOW (ref 36.0–46.0)
Hemoglobin: 8.1 g/dL — ABNORMAL LOW (ref 12.0–15.0)
MCH: 22.4 pg — ABNORMAL LOW (ref 26.0–34.0)
MCHC: 29 g/dL — ABNORMAL LOW (ref 30.0–36.0)
MCV: 77.3 fL — ABNORMAL LOW (ref 80.0–100.0)
Platelets: 402 10*3/uL — ABNORMAL HIGH (ref 150–400)
RBC: 3.61 MIL/uL — ABNORMAL LOW (ref 3.87–5.11)
RDW: 20.2 % — ABNORMAL HIGH (ref 11.5–15.5)
WBC: 8.1 10*3/uL (ref 4.0–10.5)
nRBC: 0 % (ref 0.0–0.2)

## 2021-08-13 LAB — BASIC METABOLIC PANEL
Anion gap: 8 (ref 5–15)
BUN: 12 mg/dL (ref 6–20)
CO2: 22 mmol/L (ref 22–32)
Calcium: 8.3 mg/dL — ABNORMAL LOW (ref 8.9–10.3)
Chloride: 103 mmol/L (ref 98–111)
Creatinine, Ser: 0.9 mg/dL (ref 0.44–1.00)
GFR, Estimated: >60 mL/min (ref >60)
Glucose, Bld: 226 mg/dL — ABNORMAL HIGH (ref 70–99)
Potassium: 4.6 mmol/L (ref 3.5–5.1)
Sodium: 133 mmol/L — ABNORMAL LOW (ref 135–145)

## 2021-08-13 MED ORDER — ATORVASTATIN CALCIUM 40 MG PO TABS
40.0000 mg | ORAL_TABLET | Freq: Every day | ORAL | Status: DC
  Administered 2021-08-13 – 2021-08-14 (×2): 40 mg via ORAL
  Filled 2021-08-13 (×2): qty 1

## 2021-08-13 NOTE — Progress Notes (Signed)
PHARMACIST LIPID MONITORING   Meagan [name] is a 60 y.o. female admitted on 08/12/2021 with carotid artery stenosis and PVD now s/p bypass.  Pharmacy has been consulted to optimize lipid-lowering therapy with the indication of secondary prevention for clinical ASCVD.  Recent Labs:  Lipid Panel (last 6 months):   Lab Results  Component Value Date   CHOL 141 08/13/2021   TRIG 67 08/13/2021   HDL 38 (L) 08/13/2021   CHOLHDL 3.7 08/13/2021   VLDL 13 08/13/2021   LDLCALC 90 08/13/2021    Hepatic function panel (last 6 months):   Lab Results  Component Value Date   AST 24 08/08/2021   ALT 18 08/08/2021   ALKPHOS 90 08/08/2021   BILITOT 0.5 08/08/2021    SCr (since admission):   Serum creatinine: 0.9 mg/dL 08/13/21 0129 Estimated creatinine clearance: 67.5 mL/min  Current therapy and lipid therapy tolerance Current lipid-lowering therapy: Atorvastatin '10mg'$  Previous lipid-lowering therapies (if applicable): Pt states taking pravastatin in past Documented or reported allergies or intolerances to lipid-lowering therapies (if applicable): Pt states issues with pravastatin but could not recall exactly, has been tolerating atorvastatin well  Assessment:   Patient agrees with changes to lipid-lowering therapy  Plan:    1.Statin intensity (high intensity recommended for all patients regardless of the LDL):  Statin intolerance noted. Discussed with patient, increase statin therapy to atorvastatin '40mg'$ .  2.Add ezetimibe (if any one of the following):   Not indicated at this time.  3.Refer to lipid clinic:   No  4.Follow-up with:  Primary care provider - Glendale Chard, MD  5.Follow-up labs after discharge:  Changes in lipid therapy were made. Check a lipid panel in 8-12 weeks then annually.       Bertis Ruddy, PharmD 08/13/2021, 7:37 AM

## 2021-08-13 NOTE — Progress Notes (Addendum)
  Progress Note    08/13/2021 7:43 AM 1 Day Post-Op  Subjective:  L neck and chest incisions sore.  L hand feels much warmer compared to pre-op.  Denies SOB.  Denies stroke like symptoms   Vitals:   08/13/21 0217 08/13/21 0400  BP: (!) 100/41 (!) Meagan/45  Pulse: 90 92  Resp: 18 13  Temp:  98 F (36.7 C)  SpO2: 99% 94%   Physical Exam: Lungs:  non labored Incisions:  L neck and chest c/d/I; L brachial c/d/I; L LE vein harvest incisions c/d/i Extremities:  palpable L radial pulse; L hand warm Abdomen:  soft Neurologic: CN grossly intact  CBC    Component Value Date/Time   WBC 8.1 08/13/2021 0129   RBC 3.61 (L) 08/13/2021 0129   HGB 8.1 (L) 08/13/2021 0129   HGB 9.4 (L) 06/18/2021 1156   HCT 27.9 (L) 08/13/2021 0129   HCT 32.2 (L) 06/18/2021 1156   PLT 402 (H) 08/13/2021 0129   PLT 659 (H) 06/18/2021 1156   MCV 77.3 (L) 08/13/2021 0129   MCV 73 (L) 06/18/2021 1156   MCH 22.4 (L) 08/13/2021 0129   MCHC 29.0 (L) 08/13/2021 0129   RDW 20.2 (H) 08/13/2021 0129   RDW 20.3 (H) 06/18/2021 1156   LYMPHSABS 1.7 09/26/2010 0735   MONOABS 0.7 09/26/2010 0735   EOSABS 0.2 09/26/2010 0735   BASOSABS 0.0 09/26/2010 0735    BMET    Component Value Date/Time   NA 133 (L) 08/13/2021 0129   NA 135 06/18/2021 1156   K 4.6 08/13/2021 0129   CL 103 08/13/2021 0129   CO2 22 08/13/2021 0129   GLUCOSE 226 (H) 08/13/2021 0129   BUN 12 08/13/2021 0129   BUN 15 06/18/2021 1156   CREATININE 0.90 08/13/2021 0129   CALCIUM 8.3 (L) 08/13/2021 0129   GFRNONAA >60 08/13/2021 0129   GFRAA 70 10/29/2020 1109    INR    Component Value Date/Time   INR 0.9 08/08/2021 1433     Intake/Output Summary (Last 24 hours) at 08/13/2021 0743 Last data filed at 08/13/2021 0530 Gross per 24 hour  Intake 2346.39 ml  Output 3200 ml  Net -853.61 ml     Assessment/Plan:  60 y.o. [name] is s/p L carotid to brachial artery bypass with L GSV 1 Day Post-Op   L hand well perfused with palpable L  radial pulse OOB today; encouraged patient to exercise L hand L hand tissue loss can be monitored as an outpatient Home when pain controlled and mobility improved   Dagoberto Ligas, PA-C [facility] [phone] 08/13/2021 7:43 AM  I have seen and evaluated the patient. I agree with the PA note as documented above.  Postop day 1 status post left carotid to brachial artery bypass with great saphenous vein for tissue loss of the left upper extremity.  Incisions look good.  Palpable radial pulse at the left wrist.  Neuro intact.  Continue aspirin and statin.  Plan PT OT today.  Probably needs 1 more day and hopefully discharge tomorrow.  Marty Heck, MD [facility] Office: [phone]

## 2021-08-14 LAB — GLUCOSE, CAPILLARY
Glucose-Capillary: 173 mg/dL — ABNORMAL HIGH (ref 70–99)
Glucose-Capillary: 102 mg/dL — ABNORMAL HIGH (ref 70–99)

## 2021-08-14 MED ORDER — ATORVASTATIN CALCIUM 40 MG PO TABS: 40.0000 mg | ORAL_TABLET | Freq: Every day | ORAL | 11 refills | Status: DC

## 2021-08-14 MED ORDER — OXYCODONE-ACETAMINOPHEN 5-325 MG PO TABS: 1.0000 | ORAL_TABLET | Freq: Four times a day (QID) | ORAL | 0 refills | Status: DC | PRN

## 2021-08-14 NOTE — Progress Notes (Signed)
Patient given discharge instructions and stated understanding. 

## 2021-08-14 NOTE — Progress Notes (Signed)
[facility]  Subjective  -no complaints.  States her left hand feels much warmer.   Objective (!) 119/51 91 99 F (37.2 C) (Oral) 16 96%  Intake/Output Summary (Last 24 hours) at 08/14/2021 0830 Last data filed at 08/13/2021 2120 Gross per 24 hour  Intake 118 ml  Output 300 ml  Net -182 ml    Left supraclavicular and infraclavicular incisions clean dry and intact  Left arm incision clean dry and intact Left leg vein harvest site clean dry and intact Palpable left radial pulse  Laboratory Lab Results: Recent Labs    08/12/21 1844 08/13/21 0129  WBC 11.9* 8.1  HGB 8.9* 8.1*  HCT 31.1* 27.9*  PLT 446* 402*   BMET Recent Labs    08/12/21 1844 08/13/21 0129  NA  --  133*  K  --  4.6  CL  --  103  CO2  --  22  GLUCOSE  --  226*  BUN  --  12  CREATININE 0.93 0.90  CALCIUM  --  8.3*    COAG Lab Results  Component Value Date   INR 0.9 08/08/2021   No results found for: PTT  Assessment/Planning:  Postop day 2 status post left carotid to brachial artery bypass with left leg great saphenous vein.  All of her incisions look good.  She has a palpable left radial pulse.  Neurologically intact.  Plan discharge today on aspirin and statin.  We will arrange follow-up in 2 to 3 weeks for incision checks.  She has follow-up that she is going to arrange with her hand surgeon.  Marty Heck 08/14/2021 8:30 AM --

## 2021-08-14 NOTE — Evaluation (Signed)
Occupational Therapy Evaluation Patient Details Name: Meagan [name] MRN: [id number] DOB: 15-Nov-1961 Today's Date: 08/14/2021   History of Present Illness Meagan [name] is a 60 y.o. female who presents for evalution of CAD and PAD. Pt with hx of L hand wounds and infection due to manicure from last year. 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 Pt also with dampened blood flow at ankle and brachial artery. Pt underwent L carotid-brachial artery bypass on 9/12. PMH: DM, GERD, HLD, HTN, carpal tunnel release (2021, 2022)   Clinical Impression   PTA, pt reports Modified Independence with ADLs, some IADLs and mobility without use of AD. Pt with difficulty completing tasks since L UE infection in 2021 though has been managing with use of compensatory strategies. Pt presents with expected post op pain (L neck, L UE, and L LE incision sites) but able to mobilize in room without AD. Pt able to complete ADLs without physical assist at this time though fatigues easily with one-handed strategies. Collaborated with pt on: adaptive/compensatory strategies for ADLs/IADLs, gentle ROM of L UE/neck, elevation for edema mgmt and progression of activities. Pending surgeon recommendations for progression of L UE activities, pt would benefit from OP OT follow-up to further address IADLs, ROM and strengthening of L hand. Pt plans to discharge to her sister's home initially with family assist available as needed and verbalized understanding of education.      Recommendations for follow up therapy are one component of a multi-disciplinary discharge planning process, led by the attending physician.  Recommendations may be updated based on patient status, additional functional criteria and insurance authorization.   Follow Up Recommendations  Outpatient OT;Follow surgeon's recommendation for DC plan and follow-up therapies    Equipment Recommendations  None recommended by OT    Recommendations for Other Services        Precautions / Restrictions Precautions Precautions: Fall Restrictions Weight Bearing Restrictions: No      Mobility Bed Mobility Overal bed mobility: Needs Assistance Bed Mobility: Supine to Sit     Supine to sit: Min assist     General bed mobility comments: light MIn A to pull self up with R UE    Transfers Overall transfer level: Independent Equipment used: None                  Balance Overall balance assessment: Needs assistance Sitting-balance support: No upper extremity supported;Feet supported Sitting balance-Leahy Scale: Good     Standing balance support: No upper extremity supported;During functional activity Standing balance-Leahy Scale: Good Standing balance comment: able to mobilize without AD and no LOB though wide base of support                           ADL either performed or assessed with clinical judgement   ADL Overall ADL's : Needs assistance/impaired Eating/Feeding: Sitting;Modified independent   Grooming: Modified independent;Standing;Oral care;Wash/dry face Grooming Details (indicate cue type and reason): able to use compensatory strategies to open toothpaste, etc Upper Body Bathing: Independent;Sitting;Standing   Lower Body Bathing: Modified independent;Sit to/from stand Lower Body Bathing Details (indicate cue type and reason): no assist needed to perform peri care in standing at sink, no LOB Upper Body Dressing : Modified independent;Sitting   Lower Body Dressing: Supervision/safety;Sitting/lateral leans;Sit to/from stand Lower Body Dressing Details (indicate cue type and reason): able to manage socks, typically with one handed technique. discussed alternative shoe options (slide on shoes with heel, elastic shoelaces) Toilet  Transfer: Supervision/safety;Ambulation   Toileting- Clothing Manipulation and Hygiene: Supervision/safety;Sit to/from stand       Functional mobility during ADLs:  Supervision/safety General ADL Comments: Limited by L UE discomfort and limited coordination/strength though has problem solved various compensatory strategies for task     Vision Baseline Vision/History: 1 Wears glasses Ability to See in Adequate Light: 0 Adequate Patient Visual Report: No change from baseline Vision Assessment?: No apparent visual deficits     Perception     Praxis      Pertinent Vitals/Pain Pain Assessment: 0-10 Pain Score: 5  Pain Location: primarily neck incision, LE with prolonged standing and L UE with ROM Pain Descriptors / Indicators: Grimacing;Guarding Pain Intervention(s): Monitored during session;Limited activity within patient's tolerance;RN gave pain meds during session     Hand Dominance Right   Extremity/Trunk Assessment Upper Extremity Assessment Upper Extremity Assessment: LUE deficits/detail LUE Deficits / Details: incision around inner bicep, elbow/shoulder ROM WFL though uncomfortable. hand/wrist mildly swollen. Unable to make fist, unable to oppose digits well. old wound on 1st and 4th digit. LUE Sensation: decreased light touch LUE Coordination: decreased fine motor   Lower Extremity Assessment Lower Extremity Assessment: Defer to PT evaluation   Cervical / Trunk Assessment Cervical / Trunk Assessment: Normal   Communication Communication Communication: No difficulties   Cognition Arousal/Alertness: Awake/alert Behavior During Therapy: WFL for tasks assessed/performed Overall Cognitive Status: Within Functional Limits for tasks assessed                                     General Comments  Encouraged gentle ROM of L UE and L neck to decrease stiffness and improve flexibility within tolerance    Exercises     Shoulder Instructions      Home Living Family/patient expects to be discharged to:: Private residence Living Arrangements: Other relatives (niece) Available Help at Discharge: Family;Available 24  hours/day Type of Home: House Home Access: Stairs to enter [employer] of Steps: 1-2   Home Layout: One level     Bathroom Shower/Tub: Teacher, early years/pre: Standard     Home Equipment: Shower seat;Hand held shower head;Grab bars - tub/shower   Additional Comments: Pt reports plan to DC to sister's home then return to her own home soon after that. Home setups are similar. Sister will be available 24/7 to assist as needed. Pt also reports niece and daughters can assist her at home      Prior Functioning/Environment Level of Independence: Independent        Comments: no use of AD. Reports L UE issues ongoing since 2021 and has found compensatory strategies to complete tasks (typically one handed). Able to complete ADLs though difficult at times. Difficulty with IADLs (especially cleaning) so has assist as needed. Was driving one handed. Has pit bull that she can let out in yard (niece will assist at DC with dog)        OT Problem List: Decreased strength;Decreased range of motion;Decreased coordination;Pain      OT Treatment/Interventions: Self-care/ADL training;Therapeutic exercise;DME and/or AE instruction;Therapeutic activities;Patient/family education;Manual therapy    OT Goals(Current goals can be found in the care plan section) Acute Rehab OT Goals Patient Stated Goal: go home, be able to clean and do things around the house for myself OT Goal Formulation: With patient Time For Goal Achievement: 08/28/21 Potential to Achieve Goals: Good  OT Frequency: Min 2X/week   Barriers  to D/C:            Co-evaluation              AM-PAC OT "6 Clicks" Daily Activity     Outcome Measure Help from another person eating meals?: None Help from another person taking care of personal grooming?: None Help from another person toileting, which includes using toliet, bedpan, or urinal?: A Little Help from another person bathing (including washing,  rinsing, drying)?: None Help from another person to put on and taking off regular upper body clothing?: None Help from another person to put on and taking off regular lower body clothing?: A Little 6 Click Score: 22   End of Session Nurse Communication: Mobility status;Patient requests pain meds  Activity Tolerance: Patient tolerated treatment well Patient left: in chair;with call bell/phone within reach;with nursing/sitter in room  OT Visit Diagnosis: Muscle weakness (generalized) (M62.81);Pain Pain - Right/Left: Left Pain - part of body: Arm;Hand;Leg (neck)                Time: [id number] OT Time Calculation (min): 20 min Charges:  OT General Charges $OT Visit: 1 Visit OT Evaluation $OT Eval Moderate Complexity: 1 Mod  Malachy Chamber, OTR/L Acute Rehab Services Office: [phone]   Layla Maw 08/14/2021, 9:16 AM

## 2021-08-14 NOTE — Evaluation (Signed)
Physical Therapy Evaluation Patient Details Name: Meagan [name] MRN: [id number] DOB: 1961/02/26 Today's Date: 08/14/2021  History of Present Illness  Meagan [name] is a 60 y.o. female who presents for evalution of CAD and PAD on 08/12/21. Pt with hx of L hand wounds and infection due to manicure from last year. 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 Pt also with dampened blood flow at ankle and brachial artery. Pt underwent L carotid-brachial artery bypass with L LE greater saphenous vein on 9/12. PMH: DM, GERD, HLD, HTN, carpal tunnel release (2021, 2022)  Clinical Impression   Pt admitted with above diagnosis.  At baseline pt is independent without AD.  Today, she demonstrated expected post-op changes and soreness in L LE but was able to demonstrate safe gait and transfers at near baseline level.  Pt does have limitations in L UE that are being addressed by OT.  She has 24 hr assist at home.  Does not require further skilled PT services.        Recommendations for follow up therapy are one component of a multi-disciplinary discharge planning process, led by the attending physician.  Recommendations may be updated based on patient status, additional functional criteria and insurance authorization.  Follow Up Recommendations No PT follow up    Equipment Recommendations  None recommended by PT    Recommendations for Other Services       Precautions / Restrictions Precautions Precautions: None Restrictions Weight Bearing Restrictions: No      Mobility  Bed Mobility Overal bed mobility: Needs Assistance Bed Mobility: Supine to Sit     Supine to sit: Min assist     General bed mobility comments: light MIn A to pull self up with R UE    Transfers Overall transfer level: Independent Equipment used: None Transfers: Sit to/from Stand Sit to Stand: Independent         General transfer comment: Had supervision as it was eval but was at independent  level  Ambulation/Gait Ambulation/Gait assistance: Supervision Gait Distance (Feet): 250 Feet Assistive device: None Gait Pattern/deviations: WFL(Within Functional Limits) Gait velocity: normal   General Gait Details: Steady gait without LOB; Toe out on L (baseline) and slight increase in lateral weight shifting but stable; reports feels near baseline (slightly slower due to incision/pain)  Stairs            Wheelchair Mobility    Modified Rankin (Stroke Patients Only)       Balance Overall balance assessment: Needs assistance Sitting-balance support: No upper extremity supported;Feet supported Sitting balance-Leahy Scale: Normal     Standing balance support: No upper extremity supported;During functional activity Standing balance-Leahy Scale: Good Standing balance comment: able to mobilize without AD and no LOB though wide base of support                             Pertinent Vitals/Pain Pain Assessment: 0-10 Pain Score: 5  Pain Location: primarily neck incision, LE with prolonged standing and L UE with ROM Pain Descriptors / Indicators: Grimacing;Guarding Pain Intervention(s): Limited activity within patient's tolerance;Monitored during session    Home Living Family/patient expects to be discharged to:: Private residence Living Arrangements: Other relatives (niece) Available Help at Discharge: Family;Available 24 hours/day Type of Home: House Home Access: Stairs to enter Entrance Stairs-Rails: None Entrance Stairs-Number of Steps: 1-2 Home Layout: One level Home Equipment: Shower seat;Hand held shower head;Grab bars - tub/shower Additional Comments: Pt reports plan  to DC to sister's home then return to her own home soon after that. Home setups are similar. Sister will be available 24/7 to assist as needed. Pt also reports niece and daughters can assist her at home    Prior Function Level of Independence: Independent         Comments: no use  of AD and could ambulate in community. Reports L UE issues ongoing since 2021 and has found compensatory strategies to complete tasks (typically one handed). Able to complete ADLs though difficult at times. Difficulty with IADLs (especially cleaning) so has assist as needed. Was driving one handed. Has pit bull that she can let out in yard (niece will assist at DC with dog)     Hand Dominance   Dominant Hand: Right    Extremity/Trunk Assessment   Upper Extremity Assessment Upper Extremity Assessment: Defer to OT evaluation LUE Deficits / Details: . LUE Sensation: decreased light touch LUE Coordination: decreased fine motor    Lower Extremity Assessment Lower Extremity Assessment: LLE deficits/detail;RLE deficits/detail RLE Deficits / Details: ROM WFL: MMT 5/5 LLE Deficits / Details: ROM WFL; MMT at least 3/5 but did not further test due to incisions    Cervical / Trunk Assessment Cervical / Trunk Assessment: Normal  Communication   Communication: No difficulties  Cognition Arousal/Alertness: Awake/alert Behavior During Therapy: WFL for tasks assessed/performed Overall Cognitive Status: Within Functional Limits for tasks assessed                                        General Comments General comments (skin integrity, edema, etc.): Encouraged gentle ROM of L UE and L neck to decrease stiffness and improve flexibility within tolerance    Exercises     Assessment/Plan    PT Assessment Patent does not need any further PT services  PT Problem List         PT Treatment Interventions      PT Goals (Current goals can be found in the Care Plan section)  Acute Rehab PT Goals Patient Stated Goal: go home, be able to clean and do things around the house for myself PT Goal Formulation: All assessment and education complete, DC therapy    Frequency     Barriers to discharge        Co-evaluation               AM-PAC PT "6 Clicks" Mobility  Outcome  Measure Help needed turning from your back to your side while in a flat bed without using bedrails?: None Help needed moving from lying on your back to sitting on the side of a flat bed without using bedrails?: A Little Help needed moving to and from a bed to a chair (including a wheelchair)?: None Help needed standing up from a chair using your arms (e.g., wheelchair or bedside chair)?: None Help needed to walk in hospital room?: A Little Help needed climbing 3-5 steps with a railing? : A Little 6 Click Score: 21    End of Session Equipment Utilized During Treatment: Gait belt Activity Tolerance: Patient tolerated treatment well Patient left: in bed;with call bell/phone within reach (no alarm - independent level) Nurse Communication: Mobility status      Time: [id number] PT Time Calculation (min) (ACUTE ONLY): 16 min   Charges:   PT Evaluation $PT Eval Low Complexity: 1 Low  Abran Richard, PT Acute Rehab Services Pager [phone] [facility] [place] 08/14/2021, 10:31 AM

## 2021-08-14 NOTE — Plan of Care (Signed)
  Problem: Education: Goal: Knowledge of General Education information will improve Description: Including pain rating scale, medication(s)/side effects and non-pharmacologic comfort measures Outcome: Adequate for Discharge   Problem: Health Behavior/Discharge Planning: Goal: Ability to manage health-related needs will improve Outcome: Adequate for Discharge   Problem: Clinical Measurements: Goal: Ability to maintain clinical measurements within normal limits will improve Outcome: Adequate for Discharge Goal: Will remain free from infection Outcome: Adequate for Discharge Goal: Diagnostic test results will improve Outcome: Adequate for Discharge Goal: Respiratory complications will improve Outcome: Adequate for Discharge Goal: Cardiovascular complication will be avoided Outcome: Adequate for Discharge   Problem: Activity: Goal: Risk for activity intolerance will decrease Outcome: Adequate for Discharge   Problem: Coping: Goal: Level of anxiety will decrease Outcome: Adequate for Discharge   Problem: Elimination: Goal: Will not experience complications related to bowel motility Outcome: Adequate for Discharge Goal: Will not experience complications related to urinary retention Outcome: Adequate for Discharge   Problem: Pain Managment: Goal: General experience of comfort will improve Outcome: Adequate for Discharge   Problem: Safety: Goal: Ability to remain free from injury will improve Outcome: Adequate for Discharge   Problem: Skin Integrity: Goal: Risk for impaired skin integrity will decrease Outcome: Adequate for Discharge   Problem: Acute Rehab OT Goals (only OT should resolve) Goal: Pt. Will Perform Lower Body Dressing Outcome: Adequate for Discharge Goal: Pt. Will Perform Tub/Shower Transfer Outcome: Adequate for Discharge Goal: OT Additional ADL Goal #1 Outcome: Adequate for Discharge   Problem: Acute Rehab OT Goals (only OT should resolve) Goal:  Pt/Caregiver Will Perform Home Exercise Program Outcome: Adequate for Discharge

## 2021-08-14 NOTE — Discharge Instructions (Signed)
 [facility]  Discharge instructions  Lower Extremity Bypass Surgery  Please refer to the following instruction for your post-procedure care. Your surgeon or physician assistant will discuss any changes with you.  Activity  You are encouraged to walk as much as you can. You can slowly return to normal activities during the month after your surgery. Avoid strenuous activity and heavy lifting until your doctor tells you it's OK. Avoid activities such as vacuuming or swinging a golf club. Do not drive until your doctor give the OK and you are no longer taking prescription pain medications. It is also normal to have difficulty with sleep habits, eating and bowel movement after surgery. These will go away with time.  Bathing/Showering  You may shower after you go home. Do not soak in a bathtub, hot tub, or swim until the incision heals completely.  Incision Care  Clean your incision with mild soap and water. Shower every day. Pat the area dry with a clean towel. You do not need a bandage unless otherwise instructed. Do not apply any ointments or creams to your incision. If you have open wounds you will be instructed how to care for them or a visiting nurse may be arranged for you. If you have staples or sutures along your incision they will be removed at your post-op appointment. You may have skin glue on your incision. Do not peel it off. It will come off on its own in about one week. If you have a great deal of moisture in your groin, use a gauze help keep this area dry.  Diet  Resume your normal diet. There are no special food restrictions following this procedure. A low fat/ low cholesterol diet is recommended for all patients with vascular disease. In order to heal from your surgery, it is CRITICAL to get adequate nutrition. Your body requires vitamins, minerals, and protein. Vegetables are the best source of vitamins and minerals. Vegetables also provide the  perfect balance of protein. Processed food has little nutritional value, so try to avoid this.  Medications  Resume taking all your medications unless your doctor or nurse practitioner tells you not to. If your incision is causing pain, you may take over-the-counter pain relievers such as acetaminophen (Tylenol). If you were prescribed a stronger pain medication, please aware these medication can cause nausea and constipation. Prevent nausea by taking the medication with a snack or meal. Avoid constipation by drinking plenty of fluids and eating foods with high amount of fiber, such as fruits, vegetables, and grains. Take Colase 100 mg (an over-the-counter stool softener) twice a day as needed for constipation. Do not take Tylenol if you are taking prescription pain medications.  Follow Up  Our office will schedule a follow up appointment 2-3 weeks following discharge.  Please call us immediately for any of the following conditions  Severe or worsening pain in your legs or feet while at rest or while walking Increase pain, redness, warmth, or drainage (pus) from your incision site(s) Fever of 101 degree or higher The swelling in your leg with the bypass suddenly worsens and becomes more painful than when you were in the hospital If you have been instructed to feel your graft pulse then you should do so every day. If you can no longer feel this pulse, call the office immediately. Not all patients are given this instruction.  Leg swelling is common after leg bypass surgery.  The swelling should improve over a few months   following surgery. To improve the swelling, you may elevate your legs above the level of your heart while you are sitting or resting. Your surgeon or physician assistant may ask you to apply an ACE wrap or wear compression (TED) stockings to help to reduce swelling.  Reduce your risk of vascular disease  Stop smoking. If you would like help call [facility] at 1-800-QUIT-NOW  (1-800-784-8669) or [place] at [phone].  Manage your cholesterol Maintain a desired weight Control your diabetes weight Control your diabetes Keep your blood pressure down  If you have any questions, please call the office at [phone]   

## 2021-08-15 ENCOUNTER — Telehealth: Payer: Self-pay

## 2021-08-15 NOTE — Telephone Encounter (Signed)
Transition Care Management Follow-up Telephone Call Date of discharge and from where: 08/14/2021 How have you been since you were released from the hospital? Pt stated she feels okay Any questions or concerns? No  Items Reviewed: Did the pt receive and understand the discharge instructions provided? Yes  Medications obtained and verified? Yes  Other? Yes  Any new allergies since your discharge? No  Dietary orders reviewed? Yes Do you have support at home? Yes   Home Care and Equipment/Supplies: Were home health services ordered? not applicable If so, what is the name of the agency? N/a  Has the agency set up a time to come to the patient's home? not applicable Were any new equipment or medical supplies ordered?  No What is the name of the medical supply agency? N/a Were you able to get the supplies/equipment? not applicable Do you have any questions related to the use of the equipment or supplies? No  Functional Questionnaire: (I = Independent and D = Dependent) ADLs: I/D  Bathing/Dressing- I/D  Meal Prep- I/D  Eating- I/D  Maintaining continence- I/D  Transferring/Ambulation- I/D  Managing Meds- I/D  Follow up appointments reviewed:  PCP Hospital f/u appt confirmed? Yes  Scheduled to see Raman Ghumman on 08/20/2021  @ [facility]. [facility] f/u appt confirmed? No  Scheduled to see n/a on n/a @ n/a. Are transportation arrangements needed? No  If their condition worsens, is the pt aware to call PCP or go to the Emergency Dept.? Yes Was the patient provided with contact information for the PCP's office or ED? Yes Was to pt encouraged to call back with questions or concerns? Yes

## 2021-08-15 NOTE — Discharge Summary (Signed)
Discharge Summary  Patient ID: Meagan [name] [id number] 60 y.o. 1961/09/15  Admit date: 08/12/2021  Discharge date and time: 08/14/2021  1:51 PM   Admitting Physician: Marty Heck, MD   Discharge Physician: same  Admission Diagnoses: Ischemia of left upper extremity [I99.8]  Discharge Diagnoses: same  Admission Condition: poor  Discharged Condition: fair  Indication for Admission: Ischemia of the left arm  Hospital Course: 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  She was brought in as an outpatient by Dr. Carlis Abbott on 08/12/2021 and underwent harvest of left GSV for left common carotid to brachial artery bypass.  She tolerated the procedure well and was admitted to the hospital postoperatively.  POD #1 she was without strokelike symptoms.  Patient also had a palpable left radial pulse throughout her hospital stay.  She required an additional night inpatient due to postoperative pain and to improve her mobility of her left arm.  POD #2 she is ready for discharge home.  She will continue her aspirin and statin daily.  She will follow-up in 2 to 3 weeks for incision check.  She was prescribed 2 to 3 days of narcotic pain medication for continued postoperative pain control.  She was discharged home in stable condition.  Consults: None  Treatments: surgery: Harvest of left GSV with left common carotid to brachial artery bypass by Dr. Carlis Abbott on 08/12/2021  Discharge Exam: See progress note 08/14/21 Vitals:   08/14/21 1130 08/14/21 1131  BP: (!) 95/42 (!) 95/42  Pulse: (!) 101 100  Resp: 18 15  Temp: 98.7 F (37.1 C) 98.7 F (37.1 C)  SpO2: 96% 96%     Disposition: Discharge disposition: 01-Home or Self Care       Patient Instructions:  Allergies as of 08/14/2021       Reactions   Promethazine Hcl Shortness  Of Breath, Other (See Comments)   Bells palsy   Lisinopril Itching, Swelling        Medication List     TAKE these medications    amLODipine 5 MG tablet Commonly known as: NORVASC TAKE 1 TABLET BY MOUTH EVERY DAY   aspirin EC 81 MG tablet Take 1 tablet (81 mg total) by mouth daily. Swallow whole.   atorvastatin 40 MG tablet Commonly known as: LIPITOR Take 1 tablet (40 mg total) by mouth daily. What changed:  medication strength how much to take   DIALYVITE VITAMIN D 5000 PO Take 10,000 Units by mouth in the morning.   diclofenac Sodium 1 % Gel Commonly known as: VOLTAREN Apply 1 application topically 4 (four) times daily as needed (pain.).   ferrous sulfate 325 (65 FE) MG tablet TAKE 1 TABLET BY MOUTH EVERY DAY WITH BREAKFAST What changed: See the new instructions.   FISH OIL PO Take 1 capsule by mouth in the morning.   gabapentin 300 MG capsule Commonly known as: Neurontin Take 1 capsule (300 mg total) by mouth 3 (three) times daily.   Insulin Pen Needle 31G X 8 MM Misc Commonly known as: B-D ULTRAFINE III SHORT PEN Use as directed with insulin pen   Janumet 50-1000 MG tablet Generic drug: sitaGLIPtin-metformin TAKE 1 TABLET BY MOUTH TWICE A DAY   Jardiance 10 MG Tabs tablet Generic drug: empagliflozin TAKE 1 TABLET BY MOUTH EVERY DAY IN THE MORNING What changed: See the new instructions.  MAGNESIUM PO Take 4 tablets by mouth daily.   multivitamin with minerals Tabs tablet Take 1 tablet by mouth daily.   NovoLOG FlexPen 100 UNIT/ML FlexPen Generic drug: insulin aspart Inject 12 units 3 times per day with meals not to exceed 50 units   OIL OF OREGANO PO Take 2 capsules by mouth in the morning.   OneTouch Verio Flex System w/Device Kit Use as directed to check blood sugars 2 times per day dx: e11.65   OneTouch Verio test strip Generic drug: glucose blood USE TO CHECK BLOOD SUGARS TWICE A DAY  DX:E11.65   oxyCODONE-acetaminophen 5-325 MG  tablet Commonly known as: PERCOCET/ROXICET Take 1 tablet by mouth every 6 (six) hours as needed for moderate pain.   PriLOSEC OTC 20 MG tablet Generic drug: omeprazole Take 20 mg by mouth daily.   sertraline 50 MG tablet Commonly known as: ZOLOFT TAKE 1 TABLET BY MOUTH EVERY DAY What changed: when to take this   [place] FlexTouch 200 UNIT/ML FlexTouch Pen Generic drug: insulin degludec INJECT 66 UNITS SUBCUTANEOUSLY DAILY   valACYclovir 500 MG tablet Commonly known as: VALTREX Take 500 mg by mouth in the morning.       Activity: activity as tolerated Diet: regular diet Wound Care: keep wound clean and dry  Follow-up with [facility] in 3 weeks.  Signed: Dagoberto Ligas, PA-C 08/15/2021 4:07 PM [facility] Office: [phone]

## 2021-08-15 NOTE — Telephone Encounter (Signed)
Transition Care Management Unsuccessful Follow-up Telephone Call  Date of discharge and from where:  08/12/2021   Attempts:  1st Attempt  Reason for unsuccessful TCM follow-up call:  Left voice message

## 2021-08-20 ENCOUNTER — Other Ambulatory Visit: Payer: Self-pay

## 2021-08-20 ENCOUNTER — Ambulatory Visit (INDEPENDENT_AMBULATORY_CARE_PROVIDER_SITE_OTHER): Payer: BC Managed Care – PPO | Admitting: Nurse Practitioner

## 2021-08-20 VITALS — BP 90/52 | HR 87 | Temp 98.4°F | Ht 63.0 in | Wt 179.6 lb

## 2021-08-20 DIAGNOSIS — Z09 Encounter for follow-up examination after completed treatment for conditions other than malignant neoplasm: Secondary | ICD-10-CM

## 2021-08-20 DIAGNOSIS — Z8679 Personal history of other diseases of the circulatory system: Secondary | ICD-10-CM | POA: Diagnosis not present

## 2021-08-20 NOTE — Progress Notes (Signed)
I,[name],acting as a Education administrator for [employer], NP.,have documented all relevant documentation on the behalf of [employer], NP,as directed by  [name], NP while in the presence of [name], NP.  This visit occurred during the SARS-CoV-2 public health emergency.  Safety protocols were in place, including screening questions prior to the visit, additional usage of staff PPE, and extensive cleaning of exam room while observing appropriate contact time as indicated for disinfecting solutions.  Subjective:     Patient ID: Meagan [name] , female    DOB: 11-01-1961 , 60 y.o.   MRN: [id number]   Chief Complaint  Patient presents with   Hospitalization Follow-up     HPI  The patient is here for a hospital follow-up. She recently had procedure done with the cardiologist. 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 She has hand wounds from a manicure she had this year. She is followed by a hand specialist. She is diabetic, she was out of janumet but she has gotten it filled. She states she will take her medications and eat healthy. I have advised the patient to keep her appts with the cardiologist. I have advised the patient to continue to to take her BP at home. She does not have any other concerns.     Past Medical History:  Diagnosis Date   Arthritis    Chronic kidney disease    Complication of anesthesia    Diabetes mellitus (HCC)    GERD (gastroesophageal reflux disease)    Hyperlipidemia    Hypertension    PONV (postoperative nausea and vomiting)      Family History  Problem Relation Age of Onset   Kidney failure Mother    Liver disease Mother    Early death Father    Hypertension Brother    Colon cancer Neg Hx    Stomach cancer Neg Hx      Current Outpatient Medications:    amLODipine (NORVASC) 5 MG tablet, TAKE 1 TABLET BY MOUTH EVERY DAY, Disp: 90 tablet, Rfl: 1   aspirin EC 81 MG  tablet, Take 1 tablet (81 mg total) by mouth daily. Swallow whole., Disp: 150 tablet, Rfl: 2   atorvastatin (LIPITOR) 40 MG tablet, Take 1 tablet (40 mg total) by mouth daily., Disp: 30 tablet, Rfl: 11   Blood Glucose Monitoring Suppl (ONETOUCH VERIO FLEX SYSTEM) w/Device KIT, Use as directed to check blood sugars 2 times per day dx: e11.65, Disp: 1 kit, Rfl: 1   Cholecalciferol (DIALYVITE VITAMIN D 5000 PO), Take 10,000 Units by mouth in the morning., Disp: , Rfl:    diclofenac Sodium (VOLTAREN) 1 % GEL, Apply 1 application topically 4 (four) times daily as needed (pain.)., Disp: , Rfl:    ferrous sulfate 325 (65 FE) MG tablet, TAKE 1 TABLET BY MOUTH EVERY DAY WITH BREAKFAST (Patient taking differently: Take 325 mg by mouth daily with breakfast.), Disp: 90 tablet, Rfl: 1   gabapentin (NEURONTIN) 300 MG capsule, Take 1 capsule (300 mg total) by mouth 3 (three) times daily., Disp: 90 capsule, Rfl: 2   glucose blood (ONETOUCH VERIO) test strip, USE TO CHECK BLOOD SUGARS TWICE A DAY  DX:E11.65, Disp: 100 each, Rfl: 12   insulin aspart (NOVOLOG FLEXPEN) 100 UNIT/ML FlexPen, Inject 12 units 3 times per day with meals not to exceed 50 units, Disp: 15 pen, Rfl: 1   insulin degludec (TRESIBA FLEXTOUCH) 200 UNIT/ML FlexTouch Pen, INJECT 66 UNITS SUBCUTANEOUSLY DAILY, Disp: 200  mL, Rfl: 1   Insulin Pen Needle (B-D ULTRAFINE III SHORT PEN) 31G X 8 MM MISC, Use as directed with insulin pen, Disp: 100 each, Rfl: 3   JANUMET 50-1000 MG tablet, TAKE 1 TABLET BY MOUTH TWICE A DAY, Disp: 180 tablet, Rfl: 1   JARDIANCE 10 MG TABS tablet, TAKE 1 TABLET BY MOUTH EVERY DAY IN THE MORNING (Patient taking differently: Take 10 mg by mouth daily.), Disp: 90 tablet, Rfl: 1   MAGNESIUM PO, Take 4 tablets by mouth daily., Disp: , Rfl:    Multiple Vitamin (MULTIVITAMIN WITH MINERALS) TABS tablet, Take 1 tablet by mouth daily., Disp: , Rfl:    OIL OF OREGANO PO, Take 2 capsules by mouth in the morning., Disp: , Rfl:    Omega-3  Fatty Acids (FISH OIL PO), Take 1 capsule by mouth in the morning., Disp: , Rfl:    oxyCODONE-acetaminophen (PERCOCET/ROXICET) 5-325 MG tablet, Take 1 tablet by mouth every 6 (six) hours as needed for moderate pain., Disp: 20 tablet, Rfl: 0   PRILOSEC OTC 20 MG tablet, Take 20 mg by mouth daily. , Disp: , Rfl:    sertraline (ZOLOFT) 50 MG tablet, TAKE 1 TABLET BY MOUTH EVERY DAY (Patient taking differently: Take 50 mg by mouth daily with lunch.), Disp: 90 tablet, Rfl: 0   valACYclovir (VALTREX) 500 MG tablet, Take 500 mg by mouth in the morning., Disp: , Rfl:    Allergies  Allergen Reactions   Promethazine Hcl Shortness Of Breath and Other (See Comments)    Bells palsy   Lisinopril Itching and Swelling     Review of Systems  Constitutional: Negative.  Negative for chills and fever.  HENT: Negative.  Negative for sinus pressure and sinus pain.   Respiratory: Negative.  Negative for cough, shortness of breath and wheezing.   Cardiovascular: Negative.  Negative for chest pain and palpitations.  Gastrointestinal: Negative.   Musculoskeletal:  Negative for arthralgias.  Neurological:  Negative for headaches.  Psychiatric/Behavioral: Negative.    All other systems reviewed and are negative.   Today's Vitals   08/20/21 1613  BP: (!) 90/52  Pulse: 87  Temp: 98.4 F (36.9 C)  TempSrc: Oral  Weight: 179 lb 9.6 oz (81.5 kg)  Height: '5\' 3"'  (1.6 m)  PainSc: 3   PainLoc: Neck   Body mass index is 31.81 kg/m.   Objective:  Physical Exam Constitutional:      Appearance: Normal appearance.  Cardiovascular:     Rate and Rhythm: Normal rate.     Pulses: Decreased pulses.     Heart sounds: Normal heart sounds.  Pulmonary:     Effort: No respiratory distress.     Breath sounds: Normal breath sounds. No wheezing.  Skin:    General: Skin is warm and dry.     Capillary Refill: Capillary refill takes less than 2 seconds.     Comments: Wounds to left hand/finger. Scarring  Neurological:      Mental Status: She is alert.        Assessment And Plan:     1. Hospital discharge follow-up  -patient is here for a follow up after her procedure for left artery brachial bypass she had on 08/12/21- 08/14/21. She is doing well and is followed by vascular specialist. She is a follow up with them coming up. She has no other concerns. Her procedure notes were reviewed with the patient. The patients questions were answered. She is also seeing a hand specialist for her wounds  on her left hand. The importance of taking her medication and controlling her diabetes were discussed with the patient due to her vascular disease process. All of her questions were addressed and answered.   The patient was encouraged to call or send a message through [place] for any questions or concerns.   Follow up: if symptoms persist or do not get better.   Staying healthy and adopting a healthy lifestyle for your overall health is important. You should eat 7 or more servings of fruits and vegetables per day. You should drink plenty of water to keep yourself hydrated and your kidneys healthy. This includes about 65-80+ fluid ounces of water. Limit your intake of animal fats especially for elevated cholesterol. Avoid highly processed food and limit your salt intake if you have hypertension. Avoid foods high in saturated/Trans fats. Along with a healthy diet it is also very important to maintain time for yourself to maintain a healthy mental health with low stress levels. You should get atleast 150 min of moderate intensity exercise weekly for a healthy heart. Along with eating right and exercising, aim for at least 7-9 hours of sleep daily.  Eat more whole grains which includes barley, wheat berries, oats, brown rice and whole wheat pasta. Use healthy plant oils which include olive, soy, corn, sunflower and peanut. Limit your caffeine and sugary drinks. Limit your intake of fast foods. Limit milk and dairy products to one or two  daily servings.   Patient was given opportunity to ask questions. Patient verbalized understanding of the plan and was able to repeat key elements of the plan. All questions were answered to their satisfaction.  Raman Mischa Brittingham, DNP   I, Raman Ruchama Kubicek have reviewed all documentation for this visit. The documentation on 08/20/21 for the exam, diagnosis, procedures, and orders are all accurate and complete.    IF YOU HAVE BEEN REFERRED TO A SPECIALIST, IT MAY TAKE 1-2 WEEKS TO SCHEDULE/PROCESS THE REFERRAL. IF YOU HAVE NOT HEARD FROM US/SPECIALIST IN TWO WEEKS, PLEASE GIVE [place] A CALL AT [phone].   THE PATIENT IS ENCOURAGED TO PRACTICE SOCIAL DISTANCING DUE TO THE COVID-19 PANDEMIC.

## 2021-08-26 ENCOUNTER — Telehealth: Payer: Self-pay

## 2021-08-26 NOTE — Telephone Encounter (Signed)
I left the pt a message that I was returning her call to schedule her an appt for foot pain.

## 2021-08-27 ENCOUNTER — Other Ambulatory Visit: Payer: Self-pay

## 2021-08-27 ENCOUNTER — Ambulatory Visit (INDEPENDENT_AMBULATORY_CARE_PROVIDER_SITE_OTHER): Payer: BC Managed Care – PPO | Admitting: Internal Medicine

## 2021-08-27 ENCOUNTER — Encounter: Payer: Self-pay | Admitting: Internal Medicine

## 2021-08-27 VITALS — BP 80/68 | HR 66 | Temp 99.0°F | Ht 62.8 in | Wt 179.0 lb

## 2021-08-27 DIAGNOSIS — L600 Ingrowing nail: Secondary | ICD-10-CM

## 2021-08-27 DIAGNOSIS — I959 Hypotension, unspecified: Secondary | ICD-10-CM

## 2021-08-27 DIAGNOSIS — Z23 Encounter for immunization: Secondary | ICD-10-CM | POA: Diagnosis not present

## 2021-08-27 DIAGNOSIS — L03031 Cellulitis of right toe: Secondary | ICD-10-CM | POA: Diagnosis not present

## 2021-08-27 DIAGNOSIS — I129 Hypertensive chronic kidney disease with stage 1 through stage 4 chronic kidney disease, or unspecified chronic kidney disease: Secondary | ICD-10-CM

## 2021-08-27 DIAGNOSIS — N182 Chronic kidney disease, stage 2 (mild): Secondary | ICD-10-CM | POA: Diagnosis not present

## 2021-08-27 DIAGNOSIS — I739 Peripheral vascular disease, unspecified: Secondary | ICD-10-CM

## 2021-08-27 MED ORDER — AMOXICILLIN-POT CLAVULANATE 875-125 MG PO TABS: 1.0000 | ORAL_TABLET | Freq: Two times a day (BID) | ORAL | 0 refills | Status: AC

## 2021-08-27 MED ORDER — CEFTRIAXONE SODIUM 500 MG IJ SOLR
500.0000 mg | Freq: Once | INTRAMUSCULAR | Status: AC
  Administered 2021-08-27: 500 mg via INTRAMUSCULAR

## 2021-08-27 NOTE — Progress Notes (Signed)
[name],acting as a Education administrator for [name], MD.,have documented all relevant documentation on the behalf of [name], MD,as directed by  [name], MD while in the presence of [name], MD.  This visit occurred during the SARS-CoV-2 public health emergency.  Safety protocols were in place, including screening questions prior to the visit, additional usage of staff PPE, and extensive cleaning of exam room while observing appropriate contact time as indicated for disinfecting solutions.  Subjective:     Patient ID: Meagan [name] , female    DOB: 22-Nov-1961 , 60 y.o.   MRN: [id number]   Chief Complaint  Patient presents with   Toe Pain    HPI  Toe Pain  The incident occurred at home. Injury mechanism: Patient cut her toenail too short. The pain is present in the right foot. The pain is at a severity of 8/10. The pain is moderate. The pain has been Constant since onset. She reports no foreign bodies present. The symptoms are aggravated by weight bearing. She has tried nothing for the symptoms.    Past Medical History:  Diagnosis Date   Arthritis    Chronic kidney disease    Complication of anesthesia    Diabetes mellitus (HCC)    GERD (gastroesophageal reflux disease)    Hyperlipidemia    Hypertension    PONV (postoperative nausea and vomiting)      Family History  Problem Relation Age of Onset   Kidney failure Mother    Liver disease Mother    Early death Father    Hypertension Brother    Colon cancer Neg Hx    Stomach cancer Neg Hx      Current Outpatient Medications:    amLODipine (NORVASC) 5 MG tablet, TAKE 1 TABLET BY MOUTH EVERY DAY, Disp: 90 tablet, Rfl: 1   amoxicillin-clavulanate (AUGMENTIN) 875-125 MG tablet, Take 1 tablet by mouth 2 (two) times daily for 10 days., Disp: 20 tablet, Rfl: 0   aspirin EC 81 MG tablet, Take 1 tablet (81 mg total) by mouth daily. Swallow whole., Disp: 150 tablet, Rfl: 2   atorvastatin (LIPITOR)  40 MG tablet, Take 1 tablet (40 mg total) by mouth daily., Disp: 30 tablet, Rfl: 11   Blood Glucose Monitoring Suppl (ONETOUCH VERIO FLEX SYSTEM) w/Device KIT, Use as directed to check blood sugars 2 times per day dx: e11.65, Disp: 1 kit, Rfl: 1   Cholecalciferol (DIALYVITE VITAMIN D 5000 PO), Take 10,000 Units by mouth in the morning., Disp: , Rfl:    diclofenac Sodium (VOLTAREN) 1 % GEL, Apply 1 application topically 4 (four) times daily as needed (pain.)., Disp: , Rfl:    ferrous sulfate 325 (65 FE) MG tablet, TAKE 1 TABLET BY MOUTH EVERY DAY WITH BREAKFAST (Patient taking differently: Take 325 mg by mouth daily with breakfast.), Disp: 90 tablet, Rfl: 1   gabapentin (NEURONTIN) 300 MG capsule, Take 1 capsule (300 mg total) by mouth 3 (three) times daily., Disp: 90 capsule, Rfl: 2   glucose blood (ONETOUCH VERIO) test strip, USE TO CHECK BLOOD SUGARS TWICE A DAY  DX:E11.65, Disp: 100 each, Rfl: 12   insulin aspart (NOVOLOG FLEXPEN) 100 UNIT/ML FlexPen, Inject 12 units 3 times per day with meals not to exceed 50 units, Disp: 15 pen, Rfl: 1   insulin degludec (TRESIBA FLEXTOUCH) 200 UNIT/ML FlexTouch Pen, INJECT 66 UNITS SUBCUTANEOUSLY DAILY, Disp: 200 mL, Rfl: 1   Insulin Pen Needle (B-D ULTRAFINE III SHORT PEN) 31G X  8 MM MISC, Use as directed with insulin pen, Disp: 100 each, Rfl: 3   JANUMET 50-1000 MG tablet, TAKE 1 TABLET BY MOUTH TWICE A DAY, Disp: 180 tablet, Rfl: 1   JARDIANCE 10 MG TABS tablet, TAKE 1 TABLET BY MOUTH EVERY DAY IN THE MORNING (Patient taking differently: Take 10 mg by mouth daily.), Disp: 90 tablet, Rfl: 1   MAGNESIUM PO, Take 4 tablets by mouth daily., Disp: , Rfl:    Multiple Vitamin (MULTIVITAMIN WITH MINERALS) TABS tablet, Take 1 tablet by mouth daily., Disp: , Rfl:    OIL OF OREGANO PO, Take 2 capsules by mouth in the morning., Disp: , Rfl:    Omega-3 Fatty Acids (FISH OIL PO), Take 1 capsule by mouth in the morning., Disp: , Rfl:    oxyCODONE-acetaminophen  (PERCOCET/ROXICET) 5-325 MG tablet, Take 1 tablet by mouth every 6 (six) hours as needed for moderate pain., Disp: 20 tablet, Rfl: 0   PRILOSEC OTC 20 MG tablet, Take 20 mg by mouth daily. , Disp: , Rfl:    sertraline (ZOLOFT) 50 MG tablet, TAKE 1 TABLET BY MOUTH EVERY DAY (Patient taking differently: Take 50 mg by mouth daily with lunch.), Disp: 90 tablet, Rfl: 0   valACYclovir (VALTREX) 500 MG tablet, Take 500 mg by mouth in the morning., Disp: , Rfl:    Allergies  Allergen Reactions   Promethazine Hcl Shortness Of Breath and Other (See Comments)    Bells palsy   Lisinopril Itching and Swelling     Review of Systems  Constitutional: Negative.   Respiratory: Negative.    Cardiovascular: Negative.   Gastrointestinal: Negative.   Neurological: Negative.   Psychiatric/Behavioral: Negative.      Today's Vitals   08/27/21 1104  BP: (!) 80/68  Pulse: 66  Temp: 99 F (37.2 C)  Weight: 179 lb (81.2 kg)  Height: 5' 2.8" (1.595 m)  PainSc: 8    Body mass index is 31.91 kg/m.   Objective:  Physical Exam Vitals and nursing note reviewed.  Constitutional:      Appearance: Normal appearance.  HENT:     Head: Normocephalic and atraumatic.     Nose:     Comments: Masked     Mouth/Throat:     Comments: Masked  Eyes:     Extraocular Movements: Extraocular movements intact.  Cardiovascular:     Rate and Rhythm: Normal rate and regular rhythm.     Heart sounds: Normal heart sounds.  Pulmonary:     Effort: Pulmonary effort is normal.     Breath sounds: Normal breath sounds.  Musculoskeletal:     Cervical back: Normal range of motion.  Feet:     Comments: R third toe with erythema around cuticle, no drainage. Tender to palpation Skin:    General: Skin is warm.  Neurological:     General: No focal deficit present.     Mental Status: She is alert.  Psychiatric:        Mood and Affect: Mood normal.        Behavior: Behavior normal.        Assessment And Plan:     1.  Ingrown toenail of right foot Comments: I will refer her to Podiatry for further evaluation.  - Ambulatory referral to Podiatry  2. Paronychia of third toe, right Comments: I will give her Rocephin, 560m Im x 1 and rx Augmentin BID. Advised to take full course of abx.  - cefTRIAXone (ROCEPHIN) injection 500 mg  3.  Benign hypertensive renal disease Comments: Chronic, she is hypotensive today. She agrees to rto in one week for a nurse visit. May need to decrease amlodipine to 2.7m daily.   4. Chronic kidney disease (CKD), stage II (mild) Comments: This has been stable, encouraged to keep BP controlled and to stay well hydrated.   5. Hypotension, unspecified hypotension type Comments: She is not symptomatic. I will continue to follow this.   6. Immunization due Comments: She was given flu vaccine to update her immunization history.  - Flu Vaccine QUAD 6+ mos PF IM (Fluarix Quad PF)    Patient was given opportunity to ask questions. Patient verbalized understanding of the plan and was able to repeat key elements of the plan. All questions were answered to their satisfaction.   I, RMaximino Greenland MD, have reviewed all documentation for this visit. The documentation on 09/03/21 for the exam, diagnosis, procedures, and orders are all accurate and complete.   IF YOU HAVE BEEN REFERRED TO A SPECIALIST, IT MAY TAKE 1-2 WEEKS TO SCHEDULE/PROCESS THE REFERRAL. IF YOU HAVE NOT HEARD FROM US/SPECIALIST IN TWO WEEKS, PLEASE GIVE [place] CALL AT [phone] X [phone].   THE PATIENT IS ENCOURAGED TO PRACTICE SOCIAL DISTANCING DUE TO THE COVID-19 PANDEMIC.

## 2021-08-27 NOTE — Patient Instructions (Signed)
Ingrown Toenail An ingrown toenail occurs when the corner or sides of a toenail grow into the surrounding skin. This causes discomfort and pain. The big toe is most commonly affected, but any of the toes can be affected. If an ingrown toenail is nottreated, it can become infected. What are the causes? This condition may be caused by: Wearing shoes that are too small or tight. An injury, such as stubbing your toe or having your toe stepped on. Improper cutting or care of your toenails. Having nail or foot abnormalities that were present from birth (congenital abnormalities), such as having a nail that is too big for your toe. What increases the risk? The following factors may make you more likely to develop ingrown toenails: Age. Nails tend to get thicker with age, so ingrown nails are more common among older people. Cutting your toenails incorrectly, such as cutting them very short or cutting them unevenly. An ingrown toenail is more likely to get infected if you have: Diabetes. Blood flow (circulation) problems. What are the signs or symptoms? Symptoms of an ingrown toenail may include: Pain, soreness, or tenderness. Redness. Swelling. Hardening of the skin that surrounds the toenail. Signs that an ingrown toenail may be infected include: Fluid or pus. Symptoms that get worse instead of better. How is this diagnosed? An ingrown toenail may be diagnosed based on your medical history, your symptoms, and a physical exam. If you have fluid or blood coming from your toenail, a sample may be collected to test for the specific type of bacteriathat is causing the infection. How is this treated? Treatment depends on how severe your ingrown toenail is. You may be able to care for your toenail at home. If you have an infection, you may be prescribed antibiotic medicines. If you have fluid or pus draining from your toenail, your health care provider may drain it. If you have trouble walking, you  may be given crutches to use. If you have a severe or infected ingrown toenail, you may need a procedure to remove part or all of the nail. Follow these instructions at home: Foot care  Do not pick at your toenail or try to remove it yourself. Soak your foot in warm, soapy water. Do this for 20 minutes, 3 times a day, or as often as told by your health care provider. This helps to keep your toe clean and keep your skin soft. Wear shoes that fit well and are not too tight. Your health care provider may recommend that you wear open-toed shoes while you heal. Trim your toenails regularly and carefully. Cut your toenails straight across to prevent injury to the skin at the corners of the toenail. Do not cut your nails in a curved shape. Keep your feet clean and dry to help prevent infection.  Medicines Take over-the-counter and prescription medicines only as told by your health care provider. If you were prescribed an antibiotic, take it as told by your health care provider. Do not stop taking the antibiotic even if you start to feel better. Activity Return to your normal activities as told by your health care provider. Ask your health care provider what activities are safe for you. Avoid activities that cause pain. General instructions If your health care provider told you to use crutches to help you move around, use them as instructed. Keep all follow-up visits as told by your health care provider. This is important. Contact a health care provider if: You have more redness, swelling, pain, or   other symptoms that do not improve with treatment. You have fluid, blood, or pus coming from your toenail. Get help right away if: You have a red streak on your skin that starts at your foot and spreads up your leg. You have a fever. Summary An ingrown toenail occurs when the corner or sides of a toenail grow into the surrounding skin. This causes discomfort and pain. The big toe is most commonly  affected, but any of the toes can be affected. If an ingrown toenail is not treated, it can become infected. Fluid or pus draining from your toenail is a sign of infection. Your health care provider may need to drain it. You may be given antibiotics to treat the infection. Trimming your toenails regularly and properly can help you prevent an ingrown toenail. This information is not intended to replace advice given to you by your health care provider. Make sure you discuss any questions you have with your healthcare provider. Document Revised: 03/11/2019 Document Reviewed: 08/05/2017 Elsevier Patient Education  2021 [employer]  

## 2021-09-03 ENCOUNTER — Ambulatory Visit: Payer: No Typology Code available for payment source

## 2021-09-03 ENCOUNTER — Other Ambulatory Visit: Payer: Self-pay

## 2021-09-03 ENCOUNTER — Ambulatory Visit (INDEPENDENT_AMBULATORY_CARE_PROVIDER_SITE_OTHER): Payer: Self-pay | Admitting: Physician Assistant

## 2021-09-03 VITALS — BP 90/58 | HR 90 | Temp 97.9°F | Ht 63.0 in | Wt 182.6 lb

## 2021-09-03 VITALS — BP 90/54 | HR 104 | Temp 97.9°F | Resp 20 | Ht 62.0 in | Wt 182.3 lb

## 2021-09-03 DIAGNOSIS — N760 Acute vaginitis: Secondary | ICD-10-CM | POA: Insufficient documentation

## 2021-09-03 DIAGNOSIS — A6000 Herpesviral infection of urogenital system, unspecified: Secondary | ICD-10-CM | POA: Insufficient documentation

## 2021-09-03 DIAGNOSIS — I6523 Occlusion and stenosis of bilateral carotid arteries: Secondary | ICD-10-CM

## 2021-09-03 DIAGNOSIS — I739 Peripheral vascular disease, unspecified: Secondary | ICD-10-CM

## 2021-09-03 DIAGNOSIS — B977 Papillomavirus as the cause of diseases classified elsewhere: Secondary | ICD-10-CM | POA: Insufficient documentation

## 2021-09-03 DIAGNOSIS — K219 Gastro-esophageal reflux disease without esophagitis: Secondary | ICD-10-CM | POA: Insufficient documentation

## 2021-09-03 DIAGNOSIS — I959 Hypotension, unspecified: Secondary | ICD-10-CM

## 2021-09-03 DIAGNOSIS — I10 Essential (primary) hypertension: Secondary | ICD-10-CM | POA: Insufficient documentation

## 2021-09-03 DIAGNOSIS — J40 Bronchitis, not specified as acute or chronic: Secondary | ICD-10-CM | POA: Insufficient documentation

## 2021-09-03 LAB — HM PAP SMEAR

## 2021-09-03 NOTE — Progress Notes (Signed)
POST OPERATIVE OFFICE NOTE    CC:  F/u for surgery  HPI:  This is a 60 y.o. female who is s/p left carotid to brachial artery bypass using left greater saphenous vein by Dr. 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  She states incisions of left neck, arm, and leg are all well-healed.  She states the wounds on her fingers are getting much better since surgery.  She has not yet seen her hand surgeon Dr. Burney Gauze however plans to make an appointment today.  She denies any hand pain numbness or weakness.  She was initially referred to the office due to bilateral lower extremity calf claudication.  She recently cut her right third toenail too short and developed an infection.  She was started on antibiotics by her PCP and states the wound is healing.  She has an appointment to see her podiatrist later this month.  Allergies  Allergen Reactions   Promethazine Hcl Shortness Of Breath and Other (See Comments)    Bells palsy   Lisinopril Itching and Swelling    Current Outpatient Medications  Medication Sig Dispense Refill   amLODipine (NORVASC) 5 MG tablet TAKE 1 TABLET BY MOUTH EVERY DAY 90 tablet 1   amoxicillin-clavulanate (AUGMENTIN) 875-125 MG tablet Take 1 tablet by mouth 2 (two) times daily for 10 days. 20 tablet 0   aspirin EC 81 MG tablet Take 1 tablet (81 mg total) by mouth daily. Swallow whole. 150 tablet 2   atorvastatin (LIPITOR) 40 MG tablet Take 1 tablet (40 mg total) by mouth daily. 30 tablet 11   Blood Glucose Monitoring Suppl (ONETOUCH VERIO FLEX SYSTEM) w/Device KIT Use as directed to check blood sugars 2 times per day dx: e11.65 1 kit 1   Cholecalciferol (DIALYVITE VITAMIN D 5000 PO) Take 10,000 Units by mouth in the morning.     diclofenac Sodium (VOLTAREN) 1 % GEL Apply 1 application topically 4 (four) times daily as needed (pain.).     ferrous sulfate 325 (65 FE) MG tablet TAKE 1 TABLET BY MOUTH EVERY DAY WITH BREAKFAST (Patient taking  differently: Take 325 mg by mouth daily with breakfast.) 90 tablet 1   gabapentin (NEURONTIN) 300 MG capsule Take 1 capsule (300 mg total) by mouth 3 (three) times daily. 90 capsule 2   glucose blood (ONETOUCH VERIO) test strip USE TO CHECK BLOOD SUGARS TWICE A DAY  DX:E11.65 100 each 12   insulin aspart (NOVOLOG FLEXPEN) 100 UNIT/ML FlexPen Inject 12 units 3 times per day with meals not to exceed 50 units 15 pen 1   insulin degludec (TRESIBA FLEXTOUCH) 200 UNIT/ML FlexTouch Pen INJECT 66 UNITS SUBCUTANEOUSLY DAILY 200 mL 1   Insulin Pen Needle (B-D ULTRAFINE III SHORT PEN) 31G X 8 MM MISC Use as directed with insulin pen 100 each 3   JANUMET 50-1000 MG tablet TAKE 1 TABLET BY MOUTH TWICE A DAY 180 tablet 1   JARDIANCE 10 MG TABS tablet TAKE 1 TABLET BY MOUTH EVERY DAY IN THE MORNING (Patient taking differently: Take 10 mg by mouth daily.) 90 tablet 1   MAGNESIUM PO Take 4 tablets by mouth daily.     Multiple Vitamin (MULTIVITAMIN WITH MINERALS) TABS tablet Take 1 tablet by mouth daily.     OIL OF OREGANO PO Take 2 capsules by mouth in the morning.     Omega-3 Fatty Acids (FISH OIL PO) Take 1 capsule by mouth in the morning.     oxyCODONE-acetaminophen (  PERCOCET/ROXICET) 5-325 MG tablet Take 1 tablet by mouth every 6 (six) hours as needed for moderate pain. 20 tablet 0   PRILOSEC OTC 20 MG tablet Take 20 mg by mouth daily.      sertraline (ZOLOFT) 50 MG tablet TAKE 1 TABLET BY MOUTH EVERY DAY (Patient taking differently: Take 50 mg by mouth daily with lunch.) 90 tablet 0   valACYclovir (VALTREX) 500 MG tablet Take 500 mg by mouth in the morning.     No current facility-administered medications for this visit.     ROS:  See HPI  Physical Exam:  Vitals:   09/03/21 1113  BP: (!) 90/54  Pulse: (!) 104  Resp: 20  Temp: 97.9 F (36.6 C)  TempSrc: Temporal  SpO2: 100%  Weight: 182 lb 4.8 oz (82.7 kg)  Height: '5\' 2"'  (1.575 m)    Incision: Left chest, arm, leg incisions all  well-healed Extremities:  palpable L radial pulse; wounds of fingers of left hand are dry Neuro: A&O Abdomen:  soft  Assessment/Plan:  This is a 60 y.o. female who is s/p: Left common carotid artery to brachial artery bypass using left greater saphenous vein  -Left hand is well-perfused with a palpable radial pulse; wounds of finger seem to be improving since surgery.  Follow-up with Dr. Burney Gauze to see if any further debridement is needed -Wound of right third toe seems superficial and does not appear to be infected.  Patient has known PAD with monophasic signals at the ankle with falsely elevated ABI.  She will follow-up with her podiatrist later this month.  She was encouraged to contact our office if this fails to heal to be scheduled for aortogram with right lower extremity runoff and possible intervention. -Patient will follow up in 6 months with a carotid duplex to monitor carotid artery stenosis as well as a left arm arterial duplex to monitor bypass patency   Dagoberto Ligas, PA-C [facility] [phone]  Clinic MD:  pt also evaluated by Dr. Carlis Abbott

## 2021-09-03 NOTE — Progress Notes (Signed)
Patient is here for bp check. Her bp today is 90/58  BP Readings from Last 3 Encounters:  09/03/21 (!) 90/54  08/27/21 (!) 80/68  08/20/21 (!) 90/52   Pt will follow up with the provider in 2 weeks.

## 2021-09-04 ENCOUNTER — Other Ambulatory Visit: Payer: Self-pay

## 2021-09-04 ENCOUNTER — Telehealth: Payer: Self-pay

## 2021-09-04 DIAGNOSIS — I6523 Occlusion and stenosis of bilateral carotid arteries: Secondary | ICD-10-CM

## 2021-09-04 NOTE — Telephone Encounter (Signed)
Patient calls today to request permission to get a massage s/p carotid ax bypass. Discussed with [place], wrote patient letter that she could have a massage on or after 09/18/21. Patient will come to the office and pick up letter.

## 2021-09-10 ENCOUNTER — Other Ambulatory Visit: Payer: Self-pay | Admitting: Nurse Practitioner

## 2021-09-10 ENCOUNTER — Encounter: Payer: Self-pay | Admitting: Internal Medicine

## 2021-09-12 ENCOUNTER — Encounter: Payer: Self-pay | Admitting: Sports Medicine

## 2021-09-12 ENCOUNTER — Ambulatory Visit (INDEPENDENT_AMBULATORY_CARE_PROVIDER_SITE_OTHER): Payer: BC Managed Care – PPO | Admitting: Sports Medicine

## 2021-09-12 ENCOUNTER — Ambulatory Visit: Payer: No Typology Code available for payment source | Admitting: Sports Medicine

## 2021-09-12 ENCOUNTER — Other Ambulatory Visit: Payer: Self-pay

## 2021-09-12 DIAGNOSIS — I739 Peripheral vascular disease, unspecified: Secondary | ICD-10-CM | POA: Diagnosis not present

## 2021-09-12 DIAGNOSIS — Z794 Long term (current) use of insulin: Secondary | ICD-10-CM

## 2021-09-12 DIAGNOSIS — L03031 Cellulitis of right toe: Secondary | ICD-10-CM

## 2021-09-12 DIAGNOSIS — E1122 Type 2 diabetes mellitus with diabetic chronic kidney disease: Secondary | ICD-10-CM

## 2021-09-12 DIAGNOSIS — N182 Chronic kidney disease, stage 2 (mild): Secondary | ICD-10-CM | POA: Diagnosis not present

## 2021-09-12 NOTE — Progress Notes (Signed)
Subjective: Meagan [name] is a 60 y.o. female patient with history of diabetes who presents to office today for concern of peeling skin and discoloration at the right third toenail states that the toenail was painful but it is not anymore states that a month ago she trimmed her nail and it got infected saw primary care doctor who put her on antibiotics and now it is much better nonpainful at the time but is still concerned with the discoloration to the toe.  Patient is diabetic with blood sugar ranging from 90-1 60 and last A1c of 8.  Patient denies any other pedal complaints or constitutional symptoms at this time.  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   Vaginitis and vulvovaginitis 09/03/2021   Benign hypertension 09/03/2021   Bronchitis 09/03/2021   Gastroesophageal reflux disease 09/03/2021   Genital herpes simplex 09/03/2021   Ischemia of left upper extremity 08/12/2021   Carotid stenosis 07/16/2021   PVD (peripheral vascular disease) ([place]) 07/16/2021   Numbness 04/03/2020   Bilateral carpal tunnel syndrome 04/03/2020   Type 2 diabetes mellitus with stage 2 chronic kidney disease, with long-term current use of insulin ([place]) 09/16/2018   Chronic kidney disease (CKD), stage II (mild) 09/16/2018   Benign hypertensive renal disease 09/16/2018   Current Outpatient Medications on File Prior to Visit  Medication Sig Dispense Refill   amLODipine (NORVASC) 5 MG tablet TAKE 1 TABLET BY MOUTH EVERY DAY 90 tablet 1   aspirin EC 81 MG tablet Take 1 tablet (81 mg total) by mouth daily. Swallow whole. 150 tablet 2   atorvastatin (LIPITOR) 40 MG tablet Take 1 tablet (40 mg total) by mouth daily. 30 tablet 11   Blood Glucose Monitoring Suppl (ONETOUCH VERIO FLEX SYSTEM) w/Device KIT Use as directed to check blood sugars 2 times per day dx: e11.65 1 kit 1   Cholecalciferol (DIALYVITE VITAMIN D 5000 PO) Take 10,000 Units by mouth in the morning.      diclofenac Sodium (VOLTAREN) 1 % GEL Apply 1 application topically 4 (four) times daily as needed (pain.).     ferrous sulfate 325 (65 FE) MG tablet TAKE 1 TABLET BY MOUTH EVERY DAY WITH BREAKFAST (Patient taking differently: Take 325 mg by mouth daily with breakfast.) 90 tablet 1   gabapentin (NEURONTIN) 300 MG capsule Take 1 capsule (300 mg total) by mouth 3 (three) times daily. 90 capsule 2   glucose blood (ONETOUCH VERIO) test strip USE TO CHECK BLOOD SUGAR TWICE A DAY 100 strip 12   insulin aspart (NOVOLOG FLEXPEN) 100 UNIT/ML FlexPen Inject 12 units 3 times per day with meals not to exceed 50 units 15 pen 1   insulin degludec (TRESIBA FLEXTOUCH) 200 UNIT/ML FlexTouch Pen INJECT 66 UNITS SUBCUTANEOUSLY DAILY 200 mL 1   Insulin Pen Needle (B-D ULTRAFINE III SHORT PEN) 31G X 8 MM MISC Use as directed with insulin pen 100 each 3   JANUMET 50-1000 MG tablet TAKE 1 TABLET BY MOUTH TWICE A DAY 180 tablet 1   JARDIANCE 10 MG TABS tablet TAKE 1 TABLET BY MOUTH EVERY DAY IN THE MORNING (Patient taking differently: Take 10 mg by mouth daily.) 90 tablet 1   MAGNESIUM PO Take 4 tablets by mouth daily.     Multiple Vitamin (MULTIVITAMIN WITH MINERALS) TABS tablet Take 1 tablet by mouth daily.     OIL OF OREGANO PO Take 2 capsules by mouth in the morning.     Omega-3 Fatty  Acids (FISH OIL PO) Take 1 capsule by mouth in the morning.     oxyCODONE-acetaminophen (PERCOCET/ROXICET) 5-325 MG tablet Take 1 tablet by mouth every 6 (six) hours as needed for moderate pain. 20 tablet 0   PRILOSEC OTC 20 MG tablet Take 20 mg by mouth daily.      sertraline (ZOLOFT) 50 MG tablet TAKE 1 TABLET BY MOUTH EVERY DAY (Patient taking differently: Take 50 mg by mouth daily with lunch.) 90 tablet 0   valACYclovir (VALTREX) 500 MG tablet Take 500 mg by mouth in the morning.     No current facility-administered medications on file prior to visit.   Allergies  Allergen Reactions   Promethazine Hcl Shortness Of Breath and Other  (See Comments)    Bells palsy   Lisinopril Itching and Swelling    Recent Results (from the past 2160 hour(s))  Hemoglobin A1c     Status: Abnormal   Collection Time: 06/18/21 11:56 AM  Result Value Ref Range   Hgb A1c MFr Bld 8.8 (H) 4.8 - 5.6 %    Comment:          Prediabetes: 5.7 - 6.4          Diabetes: >6.4          Glycemic control for adults with diabetes: <7.0    Est. average glucose Bld gHb Est-mCnc 206 mg/dL  CMP14+EGFR     Status: Abnormal   Collection Time: 06/18/21 11:56 AM  Result Value Ref Range   Glucose 94 65 - 99 mg/dL   BUN 15 6 - 24 mg/dL   Creatinine, Ser 0.77 0.57 - 1.00 mg/dL   eGFR 89 >59 mL/min/1.73   BUN/Creatinine Ratio 19 9 - 23   Sodium 135 134 - 144 mmol/L   Potassium 5.6 (H) 3.5 - 5.2 mmol/L   Chloride 104 96 - 106 mmol/L   CO2 18 (L) 20 - 29 mmol/L   Calcium 8.8 8.7 - 10.2 mg/dL   Total Protein 6.8 6.0 - 8.5 g/dL   Albumin 3.5 (L) 3.8 - 4.9 g/dL   Globulin, Total 3.3 1.5 - 4.5 g/dL   Albumin/Globulin Ratio 1.1 (L) 1.2 - 2.2   Bilirubin Total <0.2 0.0 - 1.2 mg/dL   Alkaline Phosphatase 100 44 - 121 IU/L   AST 8 0 - 40 IU/L   ALT 6 0 - 32 IU/L  Lipid panel     Status: Abnormal   Collection Time: 06/18/21 11:56 AM  Result Value Ref Range   Cholesterol, Total 113 100 - 199 mg/dL   Triglycerides 94 0 - 149 mg/dL   HDL 38 (L) >39 mg/dL   VLDL Cholesterol Cal 18 5 - 40 mg/dL   LDL Chol Calc (NIH) 57 0 - 99 mg/dL   Chol/HDL Ratio 3.0 0.0 - 4.4 ratio    Comment:                                   T. Chol/HDL Ratio                                             Men  Women  1/2 Avg.Risk  3.4    3.3                                   Avg.Risk  5.0    4.4                                2X Avg.Risk  9.6    7.1                                3X Avg.Risk 23.4   11.0   CBC     Status: Abnormal   Collection Time: 06/18/21 11:56 AM  Result Value Ref Range   WBC 9.9 3.4 - 10.8 x10E3/uL   RBC 4.42 3.77 - 5.28 x10E6/uL    Hemoglobin 9.4 (L) 11.1 - 15.9 g/dL   Hematocrit 32.2 (L) 34.0 - 46.6 %   MCV 73 (L) 79 - 97 fL   MCH 21.3 (L) 26.6 - 33.0 pg   MCHC 29.2 (L) 31.5 - 35.7 g/dL   RDW 20.3 (H) 11.7 - 15.4 %   Platelets 659 (H) 150 - 450 x10E3/uL  Iron, TIBC and Ferritin Panel     Status: Abnormal   Collection Time: 06/18/21 11:56 AM  Result Value Ref Range   Total Iron Binding Capacity 323 250 - 450 ug/dL   UIBC 308 131 - 425 ug/dL   Iron 15 (L) 27 - 159 ug/dL   Iron Saturation 5 (LL) 15 - 55 %   Ferritin 48 15 - 150 ng/mL  Vitamin B12     Status: None   Collection Time: 06/18/21 11:56 AM  Result Value Ref Range   Vitamin B-12 670 232 - 1,245 pg/mL  POCT Urinalysis Dipstick (44010)     Status: Abnormal   Collection Time: 06/18/21  4:49 PM  Result Value Ref Range   Color, UA yellow    Clarity, UA clear    Glucose, UA Negative Negative   Bilirubin, UA Negative    Ketones, UA Negative    Spec Grav, UA 1.025 1.010 - 1.025   Blood, UA Negative    pH, UA 5.5 5.0 - 8.0   Protein, UA Positive (A) Negative   Urobilinogen, UA negative (A) 0.2 or 1.0 E.U./dL   Nitrite, UA Negative    Leukocytes, UA Negative Negative   Appearance     Odor    POCT UA - Microalbumin     Status: Abnormal   Collection Time: 06/18/21  4:49 PM  Result Value Ref Range   Microalbumin Ur, POC 150 mg/L   Creatinine, POC 300 mg/dL   Albumin/Creatinine Ratio, Urine, POC 30-300   POCT occult blood stool     Status: Normal   Collection Time: 07/09/21 11:47 AM  Result Value Ref Range   Fecal Occult Blood, POC Negative Negative   Card #1 Date not given    Card #2 Fecal Occult Blod, POC Negative    Card #2 Date not given    Card #3 Fecal Occult Blood, POC Negative    Card #3 Date not given   I-STAT, chem 8     Status: Abnormal   Collection Time: 08/01/21 10:49 AM  Result Value Ref Range   Sodium 137 135 - 145 mmol/L   Potassium 4.6 3.5 - 5.1 mmol/L  Chloride 103 98 - 111 mmol/L   BUN 21 (H) 6 - 20 mg/dL   Creatinine, Ser  0.80 0.44 - 1.00 mg/dL   Glucose, Bld 379 (H) 70 - 99 mg/dL    Comment: Glucose reference range applies only to samples taken after fasting for at least 8 hours.   Calcium, Ion 1.21 1.15 - 1.40 mmol/L   TCO2 24 22 - 32 mmol/L   Hemoglobin 12.2 12.0 - 15.0 g/dL   HCT 36.0 36.0 - 46.0 %  Glucose, capillary     Status: Abnormal   Collection Time: 08/01/21  1:03 PM  Result Value Ref Range   Glucose-Capillary 306 (H) 70 - 99 mg/dL    Comment: Glucose reference range applies only to samples taken after fasting for at least 8 hours.  Glucose, capillary     Status: Abnormal   Collection Time: 08/08/21  2:04 PM  Result Value Ref Range   Glucose-Capillary 188 (H) 70 - 99 mg/dL    Comment: Glucose reference range applies only to samples taken after fasting for at least 8 hours.  CBC     Status: Abnormal   Collection Time: 08/08/21  2:33 PM  Result Value Ref Range   WBC 8.5 4.0 - 10.5 K/uL   RBC 4.57 3.87 - 5.11 MIL/uL   Hemoglobin 10.1 (L) 12.0 - 15.0 g/dL   HCT 36.1 36.0 - 46.0 %   MCV 79.0 (L) 80.0 - 100.0 fL   MCH 22.1 (L) 26.0 - 34.0 pg   MCHC 28.0 (L) 30.0 - 36.0 g/dL   RDW 20.6 (H) 11.5 - 15.5 %   Platelets 521 (H) 150 - 400 K/uL   nRBC 0.0 0.0 - 0.2 %    Comment: Performed at [facility], [place] [address], [place], [place] [address]  Comprehensive metabolic panel     Status: Abnormal   Collection Time: 08/08/21  2:33 PM  Result Value Ref Range   Sodium 135 135 - 145 mmol/L   Potassium 3.9 3.5 - 5.1 mmol/L   Chloride 104 98 - 111 mmol/L   CO2 20 (L) 22 - 32 mmol/L   Glucose, Bld 181 (H) 70 - 99 mg/dL    Comment: Glucose reference range applies only to samples taken after fasting for at least 8 hours.   BUN 15 6 - 20 mg/dL   Creatinine, Ser 1.16 (H) 0.44 - 1.00 mg/dL   Calcium 8.8 (L) 8.9 - 10.3 mg/dL   Total Protein 7.7 6.5 - 8.1 g/dL   Albumin 3.0 (L) 3.5 - 5.0 g/dL   AST 24 15 - 41 U/L   ALT 18 0 - 44 U/L   Alkaline Phosphatase 90 38 - 126 U/L   Total Bilirubin 0.5  0.3 - 1.2 mg/dL   GFR, Estimated 54 (L) >60 mL/min    Comment: (NOTE) Calculated using the CKD-EPI Creatinine Equation (2021)    Anion gap 11 5 - 15    Comment: Performed at [facility], [place] [address] [place], [place] [address]  Protime-INR     Status: None   Collection Time: 08/08/21  2:33 PM  Result Value Ref Range   Prothrombin Time 12.6 11.4 - 15.2 seconds   INR 0.9 0.8 - 1.2    Comment: (NOTE) INR goal varies based on device and disease states. Performed at [facility], [place] [address] [place], [place] [address]   APTT     Status: None   Collection Time: 08/08/21  2:33 PM  Result Value Ref Range   aPTT 32 24 - 36 seconds    Comment: Performed at [facility], [place] [address], [place], [place] [address]  Urinalysis, Routine w reflex microscopic Urine, Clean Catch     Status: Abnormal   Collection Time: 08/08/21  2:33 PM  Result Value Ref Range   Color, Urine YELLOW YELLOW   APPearance CLEAR CLEAR   Specific Gravity, Urine 1.010 1.005 - 1.030   pH 6.0 5.0 - 8.0   Glucose, UA >=500 (A) NEGATIVE mg/dL   Hgb urine dipstick NEGATIVE NEGATIVE   Bilirubin Urine NEGATIVE NEGATIVE   Ketones, ur NEGATIVE NEGATIVE mg/dL   Protein, ur NEGATIVE NEGATIVE mg/dL   Nitrite NEGATIVE NEGATIVE   Leukocytes,Ua NEGATIVE NEGATIVE    Comment: Performed at [facility] [address], [place], [place] [address]  Urinalysis, Microscopic (reflex)     Status: Abnormal   Collection Time: 08/08/21  2:33 PM  Result Value Ref Range   RBC / HPF 0-5 0 - 5 RBC/hpf   WBC, UA 0-5 0 - 5 WBC/hpf   Bacteria, UA RARE (A) NONE SEEN   Squamous Epithelial / LPF 0-5 0 - 5   Mucus PRESENT     Comment: Performed at [facility], [place] [address], [place], [place] [address]  Surgical pcr screen     Status: None   Collection Time: 08/08/21  2:34 PM   Specimen: Nasal Mucosa; Nasal Swab  Result Value Ref Range   MRSA, PCR NEGATIVE NEGATIVE   Staphylococcus aureus NEGATIVE NEGATIVE     Comment: (NOTE) The Xpert SA Assay (FDA approved for NASAL specimens in patients 24 years of age and older), is one component of a comprehensive surveillance program. It is not intended to diagnose infection nor to guide or monitor treatment. Performed at [facility], [place] [address] [place], [place] [address]   SARS CORONAVIRUS 2 (TAT 6-24 HRS) Nasopharyngeal Nasopharyngeal Swab     Status: None   Collection Time: 08/08/21  2:34 PM   Specimen: Nasopharyngeal Swab  Result Value Ref Range   SARS Coronavirus 2 NEGATIVE NEGATIVE    Comment: (NOTE) SARS-CoV-2 target nucleic acids are NOT DETECTED.  The SARS-CoV-2 RNA is generally detectable in upper and lower respiratory specimens during the acute phase of infection. Negative results do not preclude SARS-CoV-2 infection, do not rule out co-infections with other pathogens, and should not be used as the sole basis for treatment or other patient management decisions. Negative results must be combined with clinical observations, patient history, and epidemiological information. The expected result is Negative.  Fact Sheet for Patients: SugarRoll.be  Fact Sheet for Healthcare Providers: https://www.woods-mathews.com/  This test is not yet approved or cleared by the [place] FDA and  has been authorized for detection and/or diagnosis of SARS-CoV-2 by FDA under an Emergency Use Authorization (EUA). This EUA will remain  in effect (meaning this test can be used) for the duration of the COVID-19 declaration under Se ction 564(b)(1) of the Act, 21 U.S.C. section 360bbb-3(b)(1), unless the authorization is terminated or revoked sooner.  Performed at [facility], [place] [address], [place], [place] [address]   Type and screen     Status: None   Collection Time: 08/08/21  2:57 PM  Result Value Ref Range   ABO/RH(D) B POS    Antibody Screen NEG    Sample Expiration  08/22/2021,2359    Extend sample reason  NO TRANSFUSIONS OR PREGNANCY IN THE PAST 3 MONTHS Performed at [facility], [place] [address], [place], [place] [address]   Glucose, capillary     Status: Abnormal   Collection Time: 08/12/21  8:11 AM  Result Value Ref Range   Glucose-Capillary 165 (H) 70 - 99 mg/dL    Comment: Glucose reference range applies only to samples taken after fasting for at least 8 hours.  ABO/Rh     Status: None   Collection Time: 08/12/21  8:43 AM  Result Value Ref Range   ABO/RH(D)      B POS Performed at [facility] [address] [place], [place] [address]   Glucose, capillary     Status: Abnormal   Collection Time: 08/12/21 10:11 AM  Result Value Ref Range   Glucose-Capillary 157 (H) 70 - 99 mg/dL    Comment: Glucose reference range applies only to samples taken after fasting for at least 8 hours.  POCT Activated clotting time     Status: None   Collection Time: 08/12/21 12:25 PM  Result Value Ref Range   Activated Clotting Time 277 seconds    Comment: Reference range 74-137 seconds for patients not on anticoagulant therapy.  POCT Activated clotting time     Status: None   Collection Time: 08/12/21 12:56 PM  Result Value Ref Range   Activated Clotting Time 248 seconds    Comment: Reference range 74-137 seconds for patients not on anticoagulant therapy.  Glucose, capillary     Status: Abnormal   Collection Time: 08/12/21  2:16 PM  Result Value Ref Range   Glucose-Capillary 260 (H) 70 - 99 mg/dL    Comment: Glucose reference range applies only to samples taken after fasting for at least 8 hours.   Comment 1 Notify RN    Comment 2 Document in Chart   Glucose, capillary     Status: Abnormal   Collection Time: 08/12/21  5:26 PM  Result Value Ref Range   Glucose-Capillary 193 (H) 70 - 99 mg/dL    Comment: Glucose reference range applies only to samples taken after fasting for at least 8 hours.  CBC     Status: Abnormal   Collection Time:  08/12/21  6:44 PM  Result Value Ref Range   WBC 11.9 (H) 4.0 - 10.5 K/uL   RBC 3.98 3.87 - 5.11 MIL/uL   Hemoglobin 8.9 (L) 12.0 - 15.0 g/dL   HCT 31.1 (L) 36.0 - 46.0 %   MCV 78.1 (L) 80.0 - 100.0 fL   MCH 22.4 (L) 26.0 - 34.0 pg   MCHC 28.6 (L) 30.0 - 36.0 g/dL   RDW 20.4 (H) 11.5 - 15.5 %   Platelets 446 (H) 150 - 400 K/uL   nRBC 0.0 0.0 - 0.2 %    Comment: Performed at [facility], [place] [address] [place], [place] [address]  Creatinine, serum     Status: None   Collection Time: 08/12/21  6:44 PM  Result Value Ref Range   Creatinine, Ser 0.93 0.44 - 1.00 mg/dL   GFR, Estimated >60 >60 mL/min    Comment: (NOTE) Calculated using the CKD-EPI Creatinine Equation (2021) Performed at [facility] [address], [place], [place] [address]   Glucose, capillary     Status: Abnormal   Collection Time: 08/12/21  8:02 PM  Result Value Ref Range   Glucose-Capillary 187 (H) 70 - 99 mg/dL    Comment: Glucose reference range applies  only to samples taken after fasting for at least 8 hours.  CBC     Status: Abnormal   Collection Time: 08/13/21  1:29 AM  Result Value Ref Range   WBC 8.1 4.0 - 10.5 K/uL   RBC 3.61 (L) 3.87 - 5.11 MIL/uL   Hemoglobin 8.1 (L) 12.0 - 15.0 g/dL    Comment: Reticulocyte Hemoglobin testing may be clinically indicated, consider ordering this additional test [id number]    HCT 27.9 (L) 36.0 - 46.0 %   MCV 77.3 (L) 80.0 - 100.0 fL   MCH 22.4 (L) 26.0 - 34.0 pg   MCHC 29.0 (L) 30.0 - 36.0 g/dL   RDW 20.2 (H) 11.5 - 15.5 %   Platelets 402 (H) 150 - 400 K/uL   nRBC 0.0 0.0 - 0.2 %    Comment: Performed at [facility], [place] [address] [place], [place] [address]  Basic metabolic panel     Status: Abnormal   Collection Time: 08/13/21  1:29 AM  Result Value Ref Range   Sodium 133 (L) 135 - 145 mmol/L   Potassium 4.6 3.5 - 5.1 mmol/L   Chloride 103 98 - 111 mmol/L   CO2 22 22 - 32 mmol/L   Glucose, Bld 226 (H) 70 - 99 mg/dL    Comment:  Glucose reference range applies only to samples taken after fasting for at least 8 hours.   BUN 12 6 - 20 mg/dL   Creatinine, Ser 0.90 0.44 - 1.00 mg/dL   Calcium 8.3 (L) 8.9 - 10.3 mg/dL   GFR, Estimated >60 >60 mL/min    Comment: (NOTE) Calculated using the CKD-EPI Creatinine Equation (2021)    Anion gap 8 5 - 15    Comment: Performed at [facility] [address], [place], [place] [address]  Lipid panel     Status: Abnormal   Collection Time: 08/13/21  1:29 AM  Result Value Ref Range   Cholesterol 141 0 - 200 mg/dL   Triglycerides 67 <150 mg/dL   HDL 38 (L) >40 mg/dL   Total CHOL/HDL Ratio 3.7 RATIO   VLDL 13 0 - 40 mg/dL   LDL Cholesterol 90 0 - 99 mg/dL    Comment:        Total Cholesterol/HDL:CHD Risk Coronary Heart Disease Risk Table                     Men   Women  1/2 Average Risk   3.4   3.3  Average Risk       5.0   4.4  2 X Average Risk   9.6   7.1  3 X Average Risk  23.4   11.0        Use the calculated Patient Ratio above and the CHD Risk Table to determine the patient's CHD Risk.        ATP III CLASSIFICATION (LDL):  <100     mg/dL   Optimal  100-129  mg/dL   Near or Above                    Optimal  130-159  mg/dL   Borderline  160-189  mg/dL   High  >190     mg/dL   Very High Performed at [facility] [address] [place], [place] [address]   Glucose, capillary     Status: Abnormal   Collection Time: 08/13/21  5:53 AM  Result Value Ref  Range   Glucose-Capillary 245 (H) 70 - 99 mg/dL    Comment: Glucose reference range applies only to samples taken after fasting for at least 8 hours.  Glucose, capillary     Status: Abnormal   Collection Time: 08/13/21 11:09 AM  Result Value Ref Range   Glucose-Capillary 183 (H) 70 - 99 mg/dL    Comment: Glucose reference range applies only to samples taken after fasting for at least 8 hours.  Glucose, capillary     Status: None   Collection Time: 08/13/21  4:29 PM  Result Value Ref Range    Glucose-Capillary 98 70 - 99 mg/dL    Comment: Glucose reference range applies only to samples taken after fasting for at least 8 hours.  Glucose, capillary     Status: Abnormal   Collection Time: 08/13/21  7:53 PM  Result Value Ref Range   Glucose-Capillary 180 (H) 70 - 99 mg/dL    Comment: Glucose reference range applies only to samples taken after fasting for at least 8 hours.  Glucose, capillary     Status: Abnormal   Collection Time: 08/14/21  6:24 AM  Result Value Ref Range   Glucose-Capillary 173 (H) 70 - 99 mg/dL    Comment: Glucose reference range applies only to samples taken after fasting for at least 8 hours.  Glucose, capillary     Status: Abnormal   Collection Time: 08/14/21 11:30 AM  Result Value Ref Range   Glucose-Capillary 102 (H) 70 - 99 mg/dL    Comment: Glucose reference range applies only to samples taken after fasting for at least 8 hours.  HM PAP SMEAR     Status: None   Collection Time: 09/03/21 12:00 AM  Result Value Ref Range   HM Pap smear see in chart     Objective: General: Patient is awake, alert, and oriented x 3 and in no acute distress.  Integument: Skin is warm, dry and supple bilateral.  Nails x10 are mildly elongated and thickened at the right third toe there is dry peeling skin of evidence of resolved paronychia with no warmth redness active drainage appears to have resolving swelling and discoloration likely from previous infection that appears to now be resolved at the right third toe.  Vasculature:  Dorsalis Pedis pulse 1/4 bilateral. Posterior Tibial pulse 0/4 bilateral due to trace edema ankles. Capillary fill time <5 sec 1-5 bilateral. Positive hair growth to the level of the digits.Temperature gradient within normal limits. No varicosities present bilateral.  Neurology: The patient has diminished sensation with tuning fork bilateral.  Protective sensation within normal limits with Semmes Weinstein monofilament bilateral.  Musculoskeletal:  Asymptomatic hammertoe pedal deformities noted bilateral. Muscular strength 5/5 in all lower extremity muscular groups bilateral without pain on range of motion.  No pain to right third toe. No tenderness with calf compression bilateral.  Assessment and Plan: Problem List Items Addressed This Visit       Cardiovascular and Mediastinum   PVD (peripheral vascular disease) ([place])     Endocrine   Type 2 diabetes mellitus with stage 2 chronic kidney disease, with long-term current use of insulin ([place])   Other Visit Diagnoses     Paronychia, toe, right    -  Primary   Resolved R 3rd toe       -Examined patient. -Discussed with patient resolving paronychia advised patient and to the skin has completely returned back to normal color may continue 2-3 times weekly using Neosporin after bath or shower to  the right third toe -Mechanically debrided all nails 1-5 bilateral using sterile nail nipper and filed with dremel without incident  -Answered all patient questions -Advised her to follow-up with her PCP and regarding gabapentin I encourage patient to discuss with PCP possible increase to 600 mg at bedtime to help with increased neuropathy symptoms of numbness tingling burning and awkward feelings to feet that is worse at night -Patient to return  in 8 to 9 weeks for toe check and nail care and advised patient because she is diabetic she should not be trimming her nails herself in order to prevent her from having a problem with infection again on any other toe -Patient advised to call the office if any problems or questions arise in the meantime.  Landis Martins, DPM

## 2021-09-19 ENCOUNTER — Ambulatory Visit: Payer: No Typology Code available for payment source | Admitting: Internal Medicine

## 2021-10-16 LAB — HM DIABETES EYE EXAM

## 2021-10-17 ENCOUNTER — Other Ambulatory Visit: Payer: Self-pay | Admitting: Nurse Practitioner

## 2021-10-17 DIAGNOSIS — F419 Anxiety disorder, unspecified: Secondary | ICD-10-CM

## 2021-10-28 ENCOUNTER — Other Ambulatory Visit: Payer: Self-pay

## 2021-10-28 ENCOUNTER — Encounter: Payer: Self-pay | Admitting: Internal Medicine

## 2021-10-28 MED ORDER — GABAPENTIN 300 MG PO CAPS: 300.0000 mg | ORAL_CAPSULE | Freq: Three times a day (TID) | ORAL | 2 refills | Status: DC

## 2021-11-12 ENCOUNTER — Other Ambulatory Visit: Payer: Self-pay | Admitting: Nurse Practitioner

## 2021-11-13 ENCOUNTER — Ambulatory Visit: Payer: BC Managed Care – PPO | Admitting: Internal Medicine

## 2021-11-14 ENCOUNTER — Ambulatory Visit: Payer: BC Managed Care – PPO | Admitting: Sports Medicine

## 2021-11-18 ENCOUNTER — Encounter: Payer: BC Managed Care – PPO | Admitting: Internal Medicine

## 2021-11-18 NOTE — Progress Notes (Deleted)
[name],acting as a Education administrator for [name], MD.,have documented all relevant documentation on the behalf of [name], MD,as directed by  [name], MD while in the presence of [name], MD.  This visit occurred during the SARS-CoV-2 public health emergency.  Safety protocols were in place, including screening questions prior to the visit, additional usage of staff PPE, and extensive cleaning of exam room while observing appropriate contact time as indicated for disinfecting solutions.  Subjective:     Patient ID: Meagan [name] , female    DOB: 01-Aug-1961 , 60 y.o.   MRN: [id number]   No chief complaint on file.   HPI  The patient is here today for a diabetes/HTN f/u. She reports compliance with meds.   Diabetes She presents for her follow-up diabetic visit. She has type 2 diabetes mellitus. Her disease course has been improving. Pertinent negatives for hypoglycemia include no hunger. Pertinent negatives for diabetes include no blurred vision and no visual change. There are no hypoglycemic complications. Symptoms are improving. Diabetic complications include nephropathy. Risk factors for coronary artery disease include hypertension, obesity and diabetes mellitus. Current diabetic treatment includes insulin injections and oral agent (monotherapy). She is compliant with treatment most of the time. She is following a generally healthy diet. She has not had a previous visit with a dietitian. She participates in exercise intermittently. Her breakfast blood glucose is taken between 9-10 am. Her breakfast blood glucose range is generally 110-130 mg/dl. She does not see a podiatrist.Eye exam is current.  Hypertension This is a chronic problem. The current episode started more than 1 year ago. Pertinent negatives include no blurred vision. Risk factors for coronary artery disease include diabetes mellitus, dyslipidemia, obesity, post-menopausal state and sedentary  lifestyle. Past treatments include ACE inhibitors. Compliance problems include exercise.     Past Medical History:  Diagnosis Date   Arthritis    Chronic kidney disease    Complication of anesthesia    Diabetes mellitus (HCC)    GERD (gastroesophageal reflux disease)    Hyperlipidemia    Hypertension    PONV (postoperative nausea and vomiting)      Family History  Problem Relation Age of Onset   Kidney failure Mother    Liver disease Mother    Early death Father    Hypertension Brother    Colon cancer Neg Hx    Stomach cancer Neg Hx      Current Outpatient Medications:    amLODipine (NORVASC) 5 MG tablet, TAKE 1 TABLET BY MOUTH EVERY DAY, Disp: 90 tablet, Rfl: 1   aspirin EC 81 MG tablet, Take 1 tablet (81 mg total) by mouth daily. Swallow whole., Disp: 150 tablet, Rfl: 2   atorvastatin (LIPITOR) 40 MG tablet, Take 1 tablet (40 mg total) by mouth daily., Disp: 30 tablet, Rfl: 11   Blood Glucose Monitoring Suppl (ONETOUCH VERIO FLEX SYSTEM) w/Device KIT, Use as directed to check blood sugars 2 times per day dx: e11.65, Disp: 1 kit, Rfl: 1   Cholecalciferol (DIALYVITE VITAMIN D 5000 PO), Take 10,000 Units by mouth in the morning., Disp: , Rfl:    diclofenac Sodium (VOLTAREN) 1 % GEL, Apply 1 application topically 4 (four) times daily as needed (pain.)., Disp: , Rfl:    ferrous sulfate 325 (65 FE) MG tablet, TAKE 1 TABLET BY MOUTH EVERY DAY WITH BREAKFAST (Patient taking differently: Take 325 mg by mouth daily with breakfast.), Disp: 90 tablet, Rfl: 1   gabapentin (NEURONTIN) 300  MG capsule, Take 1 capsule (300 mg total) by mouth 3 (three) times daily. Take 2 capsules to equal 633m at night, Disp: 180 capsule, Rfl: 2   glucose blood (ONETOUCH VERIO) test strip, USE TO CHECK BLOOD SUGAR TWICE A DAY, Disp: 100 strip, Rfl: 12   insulin aspart (NOVOLOG FLEXPEN) 100 UNIT/ML FlexPen, Inject 12 units 3 times per day with meals not to exceed 50 units, Disp: 15 pen, Rfl: 1   insulin degludec  (TRESIBA FLEXTOUCH) 200 UNIT/ML FlexTouch Pen, INJECT 66 UNITS SUBCUTANEOUSLY DAILY, Disp: 200 mL, Rfl: 1   Insulin Pen Needle (B-D ULTRAFINE III SHORT PEN) 31G X 8 MM MISC, Use as directed with insulin pen, Disp: 100 each, Rfl: 3   JANUMET 50-1000 MG tablet, TAKE 1 TABLET BY MOUTH TWICE A DAY, Disp: 180 tablet, Rfl: 1   JARDIANCE 10 MG TABS tablet, TAKE 1 TABLET BY MOUTH EVERY DAY IN THE MORNING (Patient taking differently: Take 10 mg by mouth daily.), Disp: 90 tablet, Rfl: 1   MAGNESIUM PO, Take 4 tablets by mouth daily., Disp: , Rfl:    Multiple Vitamin (MULTIVITAMIN WITH MINERALS) TABS tablet, Take 1 tablet by mouth daily., Disp: , Rfl:    OIL OF [place] PO, Take 2 capsules by mouth in the morning., Disp: , Rfl:    Omega-3 Fatty Acids (FISH OIL PO), Take 1 capsule by mouth in the morning., Disp: , Rfl:    oxyCODONE-acetaminophen (PERCOCET/ROXICET) 5-325 MG tablet, Take 1 tablet by mouth every 6 (six) hours as needed for moderate pain., Disp: 20 tablet, Rfl: 0   PRILOSEC OTC 20 MG tablet, Take 20 mg by mouth daily. , Disp: , Rfl:    sertraline (ZOLOFT) 50 MG tablet, TAKE 1 TABLET BY MOUTH EVERY DAY, Disp: 90 tablet, Rfl: 0   valACYclovir (VALTREX) 500 MG tablet, Take 500 mg by mouth in the morning., Disp: , Rfl:    Allergies  Allergen Reactions   Promethazine Hcl Shortness Of Breath and Other (See Comments)    Bells palsy   Lisinopril Itching and Swelling     Review of Systems  Constitutional: Negative.   Eyes:  Negative for blurred vision.  Respiratory: Negative.    Cardiovascular: Negative.   Neurological: Negative.   Psychiatric/Behavioral: Negative.      There were no vitals filed for this visit. There is no height or weight on file to calculate BMI.   Objective:  Physical Exam      Assessment And Plan:     1. 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 Benign hypertensive renal disease     Patient was  given opportunity to ask questions. Patient verbalized understanding of the plan and was able to repeat key elements of the plan. All questions were answered to their satisfaction.  JSheppard EvensLlittleton, CMA   I, JPleak CMA, have reviewed all documentation for this visit. The documentation on 11/18/21 for the exam, diagnosis, procedures, and orders are all accurate and complete.   IF YOU HAVE BEEN REFERRED TO A SPECIALIST, IT MAY TAKE 1-2 WEEKS TO SCHEDULE/PROCESS THE REFERRAL. IF YOU HAVE NOT HEARD FROM US/SPECIALIST IN TWO WEEKS, PLEASE GIVE [place] CALL AT [phone].   THE PATIENT IS ENCOURAGED TO PRACTICE SOCIAL DISTANCING DUE TO THE COVID-19 PANDEMIC.

## 2021-11-19 NOTE — Progress Notes (Signed)
No show - erroneous

## 2021-11-20 ENCOUNTER — Other Ambulatory Visit: Payer: Self-pay | Admitting: Internal Medicine

## 2021-12-03 ENCOUNTER — Other Ambulatory Visit: Payer: Self-pay

## 2021-12-03 ENCOUNTER — Ambulatory Visit: Payer: BC Managed Care – PPO | Admitting: Nurse Practitioner

## 2021-12-03 VITALS — BP 128/70 | HR 98 | Temp 98.7°F | Ht 63.0 in | Wt 188.0 lb

## 2021-12-03 DIAGNOSIS — Z6833 Body mass index (BMI) 33.0-33.9, adult: Secondary | ICD-10-CM

## 2021-12-03 DIAGNOSIS — M79642 Pain in left hand: Secondary | ICD-10-CM

## 2021-12-03 DIAGNOSIS — R319 Hematuria, unspecified: Secondary | ICD-10-CM

## 2021-12-03 DIAGNOSIS — R82998 Other abnormal findings in urine: Secondary | ICD-10-CM

## 2021-12-03 DIAGNOSIS — Z794 Long term (current) use of insulin: Secondary | ICD-10-CM

## 2021-12-03 DIAGNOSIS — R350 Frequency of micturition: Secondary | ICD-10-CM

## 2021-12-03 DIAGNOSIS — E6609 Other obesity due to excess calories: Secondary | ICD-10-CM

## 2021-12-03 DIAGNOSIS — N182 Chronic kidney disease, stage 2 (mild): Secondary | ICD-10-CM

## 2021-12-03 DIAGNOSIS — E1122 Type 2 diabetes mellitus with diabetic chronic kidney disease: Secondary | ICD-10-CM

## 2021-12-03 LAB — POCT URINALYSIS DIPSTICK
Bilirubin, UA: NEGATIVE
Glucose, UA: POSITIVE — AB
Ketones, UA: NEGATIVE
Nitrite, UA: NEGATIVE
Protein, UA: POSITIVE — AB
Spec Grav, UA: <=1.005 — AB (ref 1.010–1.025)
Urobilinogen, UA: 0.2 E.U./dL
pH, UA: 5.5 (ref 5.0–8.0)

## 2021-12-03 MED ORDER — NITROFURANTOIN MONOHYD MACRO 100 MG PO CAPS: 100.0000 mg | ORAL_CAPSULE | Freq: Two times a day (BID) | ORAL | 0 refills | Status: AC

## 2021-12-03 MED ORDER — DICLOFENAC SODIUM 1 % EX GEL: CUTANEOUS | 1 refills | Status: DC

## 2021-12-03 NOTE — Progress Notes (Signed)
I,Meagan [name],acting as a Education administrator for [employer], NP.,have documented all relevant documentation on the behalf of [employer], NP,as directed by  [name], NP while in the presence of [name], NP.  This visit occurred during the SARS-CoV-2 public health emergency.  Safety protocols were in place, including screening questions prior to the visit, additional usage of staff PPE, and extensive cleaning of exam room while observing appropriate contact time as indicated for disinfecting solutions.  Subjective:     Meagan [name] ID: Meagan [name] , female    DOB: 1961-06-03 , 61 y.o.   MRN: [id number]   Chief Complaint  Meagan [name] presents with   Urinary Frequency    HPI  Meagan [name] presents today for urinary frequency and urgency with bilateral flank pain.  Meagan [name] has has this problem for about a week.   Urinary Frequency  Associated symptoms include flank pain and frequency. Pertinent negatives include no chills.    Past Medical History:  Diagnosis Date   Arthritis    Chronic kidney disease    Complication of anesthesia    Diabetes mellitus (HCC)    GERD (gastroesophageal reflux disease)    Hyperlipidemia    Hypertension    PONV (postoperative nausea and vomiting)      Family History  Problem Relation Age of Onset   Kidney failure Mother    Liver disease Mother    Early death Father    Hypertension Brother    Colon cancer Neg Hx    Stomach cancer Neg Hx      Current Outpatient Medications:    nitrofurantoin, macrocrystal-monohydrate, (MACROBID) 100 MG capsule, Take 1 capsule (100 mg total) by mouth 2 (two) times daily for 7 days., Disp: 14 capsule, Rfl: 0   amLODipine (NORVASC) 5 MG tablet, TAKE 1 TABLET BY MOUTH EVERY DAY, Disp: 90 tablet, Rfl: 1   aspirin EC 81 MG tablet, Take 1 tablet (81 mg total) by mouth daily. Swallow whole., Disp: 150 tablet, Rfl: 2   atorvastatin (LIPITOR) 40 MG tablet, Take 1 tablet (40 mg total) by mouth daily., Disp: 30  tablet, Rfl: 11   Blood Glucose Monitoring Suppl (ONETOUCH VERIO FLEX SYSTEM) w/Device KIT, Use as directed to check blood sugars 2 times per day dx: e11.65, Disp: 1 kit, Rfl: 1   Cholecalciferol (DIALYVITE VITAMIN D 5000 PO), Take 10,000 Units by mouth in the morning., Disp: , Rfl:    diclofenac Sodium (VOLTAREN) 1 % GEL, Use as directed, Disp: 4 g, Rfl: 1   ferrous sulfate 325 (65 FE) MG tablet, TAKE 1 TABLET BY MOUTH EVERY DAY WITH BREAKFAST (Meagan [name] taking differently: Take 325 mg by mouth daily with breakfast.), Disp: 90 tablet, Rfl: 1   gabapentin (NEURONTIN) 300 MG capsule, Take 1 capsule (300 mg total) by mouth 3 (three) times daily. Take 2 capsules to equal 638m at night, Disp: 180 capsule, Rfl: 2   glucose blood (ONETOUCH VERIO) test strip, USE TO CHECK BLOOD SUGAR TWICE A DAY, Disp: 100 strip, Rfl: 12   insulin aspart (NOVOLOG FLEXPEN) 100 UNIT/ML FlexPen, Inject 12 units 3 times per day with meals not to exceed 50 units, Disp: 15 pen, Rfl: 1   insulin degludec (TRESIBA FLEXTOUCH) 200 UNIT/ML FlexTouch Pen, INJECT 66 UNITS SUBCUTANEOUSLY DAILY, Disp: 200 mL, Rfl: 1   Insulin Pen Needle (B-D ULTRAFINE III SHORT PEN) 31G X 8 MM MISC, Use as directed with insulin pen, Disp: 100 each, Rfl: 3   JANUMET 50-1000 MG tablet, TAKE 1 TABLET BY MOUTH TWICE  A DAY, Disp: 180 tablet, Rfl: 1   JARDIANCE 10 MG TABS tablet, TAKE 1 TABLET BY MOUTH EVERY DAY IN THE MORNING, Disp: 90 tablet, Rfl: 1   MAGNESIUM PO, Take 4 tablets by mouth daily., Disp: , Rfl:    Multiple Vitamin (MULTIVITAMIN WITH MINERALS) TABS tablet, Take 1 tablet by mouth daily., Disp: , Rfl:    OIL OF [place] PO, Take 2 capsules by mouth in the morning., Disp: , Rfl:    Omega-3 Fatty Acids (FISH OIL PO), Take 1 capsule by mouth in the morning., Disp: , Rfl:    oxyCODONE-acetaminophen (PERCOCET/ROXICET) 5-325 MG tablet, Take 1 tablet by mouth every 6 (six) hours as needed for moderate pain., Disp: 20 tablet, Rfl: 0   PRILOSEC OTC 20 MG  tablet, Take 20 mg by mouth daily. , Disp: , Rfl:    sertraline (ZOLOFT) 50 MG tablet, TAKE 1 TABLET BY MOUTH EVERY DAY, Disp: 90 tablet, Rfl: 0   valACYclovir (VALTREX) 500 MG tablet, Take 500 mg by mouth in the morning., Disp: , Rfl:    Allergies  Allergen Reactions   Promethazine Hcl Shortness Of Breath and Other (See Comments)    Bells palsy   Lisinopril Itching and Swelling     Review of Systems  Constitutional: Negative.  Negative for chills, fatigue and fever.  HENT:  Negative for congestion.   Respiratory: Negative.  Negative for cough, shortness of breath and wheezing.   Cardiovascular: Negative.  Negative for chest pain and palpitations.  Gastrointestinal: Negative.   Genitourinary:  Positive for flank pain and frequency.  Musculoskeletal:  Positive for back pain.  Neurological: Negative.     Today's Vitals   12/03/21 1623  BP: 128/70  Pulse: 98  Temp: 98.7 F (37.1 C)  TempSrc: Oral  Weight: 188 lb (85.3 kg)  Height: _0  (1.6 m)   Body mass index is 33.3 kg/m.  Wt Readings from Last 3 Encounters:  12/03/21 188 lb (85.3 kg)  09/03/21 182 lb 9.6 oz (82.8 kg)  09/03/21 182 lb 4.8 oz (82.7 kg)    Objective:  Physical Exam Constitutional:      Appearance: Normal appearance.  HENT:     Head: Normocephalic and atraumatic.  Cardiovascular:     Rate and Rhythm: Normal rate and regular rhythm.     Pulses: Normal pulses.     Heart sounds: No murmur heard.   No friction rub.  Pulmonary:     Effort: Pulmonary effort is normal. No respiratory distress.     Breath sounds: Normal breath sounds. No wheezing.  Skin:    General: Skin is warm and dry.     Capillary Refill: Capillary refill takes less than 2 seconds.  Neurological:     Mental Status: Meagan [name] is alert and oriented to person, place, and time.        Assessment And Plan:     1. Urinary frequency - POCT Urinalysis Dipstick (81002) - Culture, Urine  2. Hand pain, left - diclofenac Sodium (VOLTAREN)  1 % GEL; Use as directed  Dispense: 4 g; Refill: 1  3. Type 2 diabetes mellitus with stage 2 chronic kidney disease, with long-term current use of insulin (HCC) -Continue meds  --Discussed with Meagan [name] the importance of glycemic control and long term complications from uncontrolled diabetes. Discussed with the Meagan [name] the importance of compliance with home glucose monitoring, diet which includes decrease amount of sugary drinks and foods. Importance of exercise was also discussed with the Meagan [name]. Importance of  eye exams, self foot care and compliance to office visits was also discussed with the Meagan [name].  - Hemoglobin A1c - CBC no Diff - CMP14+EGFR  4. Leukocytes in urine - nitrofurantoin, macrocrystal-monohydrate, (MACROBID) 100 MG capsule; Take 1 capsule (100 mg total) by mouth 2 (two) times daily for 7 days.  Dispense: 14 capsule; Refill: 0 -urine culture   5. Hematuria, unspecified type - Ambulatory referral to Urology  6. Class 1 obesity due to excess calories with serious comorbidity and body mass index (BMI) of 33.0 to 33.9 in adult -Advised Meagan [name] on a healthy diet including avoiding fast food and red meats. Increase the intake of lean meats including grilled chicken and [place].  Drink a lot of water. Decrease intake of fatty foods. Exercise for 30-45 min. 4-5 a week to decrease the risk of cardiac event.    The Meagan [name] was encouraged to call or send a message through [place] [facility] for any questions or concerns.   Follow up: if symptoms persist or do not get better.   Side effects and appropriate use of all the medication(s) were discussed with the Meagan [name] today. Meagan [name] advised to use the medication(s) as directed by their healthcare provider. The Meagan [name] was encouraged to read, review, and understand all associated package inserts and contact our office with any questions or concerns. The Meagan [name] accepts the risks of the treatment plan and had an opportunity to ask questions.    Prescription sent to pharmacy. Discussed medication disired effects, side effects, and how to administer medication. Urine sent for culture. Make take OTC medications as needed. Increase oral fluid intake. Follow up for worsening or persistent symptoms. Meagan [name] verbalized understanding regarding plan of care and all questions answered.   Meagan [name] was given opportunity to ask questions. Meagan [name] verbalized understanding of the plan and was able to repeat key elements of the plan. All questions were answered to their satisfaction.  Raman Dontreal Miera, DNP   I, Raman Jaquarious Grey have reviewed all documentation for this visit. The documentation on 12/03/20 for the exam, diagnosis, procedures, and orders are all accurate and complete.   IF YOU HAVE BEEN REFERRED TO A SPECIALIST, IT MAY TAKE 1-2 WEEKS TO SCHEDULE/PROCESS THE REFERRAL. IF YOU HAVE NOT HEARD FROM US/SPECIALIST IN TWO WEEKS, PLEASE GIVE [place] A CALL AT [phone].   THE Meagan [name] IS ENCOURAGED TO PRACTICE SOCIAL DISTANCING DUE TO THE COVID-19 PANDEMIC.

## 2021-12-03 NOTE — Patient Instructions (Signed)
Urinary Frequency, Adult Urinary frequency means urinating more often than usual. You may urinate every 1-2 hours even though you drink a normal amount of fluid and do not have a bladder infection or condition. Although you urinate more often than normal, the total amount of urine produced in a day is normal. With urinary frequency, you may have an urgent need to urinate often. The stress and anxiety of needing to find a bathroom quickly can make this urge worse. This condition may go away on its own, or you may need treatment at home. Home treatment may include bladder training, exercises, taking medicines, or making changes to your diet. Follow these instructions at home: Bladder health Your health care provider will tell you what to do to improve bladder health. You may be told to: Keep a bladder diary. Keep track of: What you eat and drink. How often you urinate. How much you urinate. Follow a bladder training program. This may include: Learning to delay going to the bathroom. Double urinating, also called voiding. This helps if you are not completely emptying your bladder. Scheduled voiding. Do Kegel exercises. Kegel exercises strengthen the muscles that help control urination, which may help the condition.  Eating and drinking Follow instructions from your health care provider about eating or drinking restrictions. You may be told to: Avoid caffeine. Drink fewer fluids, especially alcohol. Avoid drinking in the evening. Avoid foods or drinks that may irritate the bladder. These include coffee, tea, soda, artificial sweeteners, citrus, tomato-based foods, and chocolate. Eat foods that help prevent or treat constipation. Constipation can make urinary frequency worse. You may need to take these actions to prevent or treat constipation: Drink enough fluid to keep your urine pale yellow. Take over-the-counter or prescription medicines. Eat foods that are high in fiber, such as beans, whole  grains, and fresh fruits and vegetables. Limit foods that are high in fat and processed sugars, such as fried or sweet foods. General instructions Take over-the-counter and prescription medicines only as told by your health care provider. Keep all follow-up visits. This is important. Contact a health care provider if: You start urinating more often. You feel pain or irritation when you urinate. You notice blood in your urine. Your urine looks cloudy. You develop a fever. You begin vomiting. Get help right away if: You are unable to urinate. Summary Urinary frequency means urinating more often than usual. With urinary frequency, you may urinate every 1-2 hours even though you drink a normal amount of fluid and do not have a bladder infection or other bladder condition. Your health care provider may recommend that you keep a bladder diary, follow a bladder training program, or make dietary changes. If told by your health care provider, do Kegel exercises to strengthen the muscles that help control urination. Take over-the-counter and prescription medicines only as told by your health care provider. Contact a health care provider if your symptoms do not improve or get worse. This information is not intended to replace advice given to you by your health care provider. Make sure you discuss any questions you have with your health care provider. Document Revised: 06/22/2020 Document Reviewed: 06/22/2020 Elsevier Patient Education  [place].

## 2021-12-04 LAB — CMP14+EGFR
ALT: 7 IU/L (ref 0–32)
AST: 12 IU/L (ref 0–40)
Albumin/Globulin Ratio: 1 — ABNORMAL LOW (ref 1.2–2.2)
Albumin: 3.6 g/dL — ABNORMAL LOW (ref 3.8–4.9)
Alkaline Phosphatase: 94 IU/L (ref 44–121)
BUN/Creatinine Ratio: 16 (ref 12–28)
BUN: 19 mg/dL (ref 8–27)
Bilirubin Total: 0.4 mg/dL (ref 0.0–1.2)
CO2: 20 mmol/L (ref 20–29)
Calcium: 8.5 mg/dL — ABNORMAL LOW (ref 8.7–10.3)
Chloride: 100 mmol/L (ref 96–106)
Creatinine, Ser: 1.18 mg/dL — ABNORMAL HIGH (ref 0.57–1.00)
Globulin, Total: 3.5 g/dL (ref 1.5–4.5)
Glucose: 299 mg/dL — ABNORMAL HIGH (ref 70–99)
Potassium: 4.7 mmol/L (ref 3.5–5.2)
Sodium: 136 mmol/L (ref 134–144)
Total Protein: 7.1 g/dL (ref 6.0–8.5)
eGFR: 53 mL/min/{1.73_m2} — ABNORMAL LOW (ref >59)

## 2021-12-04 LAB — HEMOGLOBIN A1C
Est. average glucose Bld gHb Est-mCnc: 200 mg/dL
Hgb A1c MFr Bld: 8.6 % — ABNORMAL HIGH (ref 4.8–5.6)

## 2021-12-04 LAB — CBC
Hematocrit: 33.4 % — ABNORMAL LOW (ref 34.0–46.6)
Hemoglobin: 9.7 g/dL — ABNORMAL LOW (ref 11.1–15.9)
MCH: 21.1 pg — ABNORMAL LOW (ref 26.6–33.0)
MCHC: 29 g/dL — ABNORMAL LOW (ref 31.5–35.7)
MCV: 73 fL — ABNORMAL LOW (ref 79–97)
Platelets: 511 10*3/uL — ABNORMAL HIGH (ref 150–450)
RBC: 4.59 x10E6/uL (ref 3.77–5.28)
RDW: 18.9 % — ABNORMAL HIGH (ref 11.7–15.4)
WBC: 10.6 10*3/uL (ref 3.4–10.8)

## 2021-12-06 LAB — URINE CULTURE

## 2021-12-08 ENCOUNTER — Other Ambulatory Visit: Payer: Self-pay | Admitting: Internal Medicine

## 2021-12-10 ENCOUNTER — Ambulatory Visit: Payer: BC Managed Care – PPO | Admitting: Podiatry

## 2021-12-27 LAB — HM MAMMOGRAPHY

## 2021-12-30 ENCOUNTER — Encounter: Payer: Self-pay | Admitting: Internal Medicine

## 2022-01-08 ENCOUNTER — Encounter: Payer: Self-pay | Admitting: Internal Medicine

## 2022-01-08 ENCOUNTER — Ambulatory Visit: Payer: BC Managed Care – PPO | Admitting: Internal Medicine

## 2022-01-08 ENCOUNTER — Other Ambulatory Visit: Payer: Self-pay

## 2022-01-08 VITALS — BP 118/76 | HR 82 | Temp 98.0°F | Ht 63.0 in | Wt 191.0 lb

## 2022-01-08 DIAGNOSIS — E6609 Other obesity due to excess calories: Secondary | ICD-10-CM

## 2022-01-08 DIAGNOSIS — N182 Chronic kidney disease, stage 2 (mild): Secondary | ICD-10-CM | POA: Diagnosis not present

## 2022-01-08 DIAGNOSIS — Z794 Long term (current) use of insulin: Secondary | ICD-10-CM

## 2022-01-08 DIAGNOSIS — Z23 Encounter for immunization: Secondary | ICD-10-CM | POA: Diagnosis not present

## 2022-01-08 DIAGNOSIS — I129 Hypertensive chronic kidney disease with stage 1 through stage 4 chronic kidney disease, or unspecified chronic kidney disease: Secondary | ICD-10-CM

## 2022-01-08 DIAGNOSIS — I739 Peripheral vascular disease, unspecified: Secondary | ICD-10-CM

## 2022-01-08 DIAGNOSIS — I1310 Hypertensive heart and chronic kidney disease without heart failure, with stage 1 through stage 4 chronic kidney disease, or unspecified chronic kidney disease: Secondary | ICD-10-CM

## 2022-01-08 DIAGNOSIS — E1122 Type 2 diabetes mellitus with diabetic chronic kidney disease: Secondary | ICD-10-CM | POA: Diagnosis not present

## 2022-01-08 DIAGNOSIS — Z6833 Body mass index (BMI) 33.0-33.9, adult: Secondary | ICD-10-CM

## 2022-01-08 LAB — BMP8+EGFR
BUN/Creatinine Ratio: 19 (ref 12–28)
BUN: 21 mg/dL (ref 8–27)
CO2: 20 mmol/L (ref 20–29)
Calcium: 9.1 mg/dL (ref 8.7–10.3)
Chloride: 103 mmol/L (ref 96–106)
Creatinine, Ser: 1.09 mg/dL — ABNORMAL HIGH (ref 0.57–1.00)
Glucose: 164 mg/dL — ABNORMAL HIGH (ref 70–99)
Potassium: 4.9 mmol/L (ref 3.5–5.2)
Sodium: 140 mmol/L (ref 134–144)
eGFR: 58 mL/min/{1.73_m2} — ABNORMAL LOW (ref >59)

## 2022-01-08 MED ORDER — VALACYCLOVIR HCL 500 MG PO TABS: 500.0000 mg | ORAL_TABLET | Freq: Every morning | ORAL | 1 refills | Status: DC

## 2022-01-08 NOTE — Patient Instructions (Signed)

## 2022-01-08 NOTE — Progress Notes (Signed)
I,[name],acting as a Education administrator for [name], MD.,have documented all relevant documentation on the behalf of [name], MD,as directed by  [name], MD while in the presence of [name], MD.  This visit occurred during the SARS-CoV-2 public health emergency.  Safety protocols were in place, including screening questions prior to the visit, additional usage of staff PPE, and extensive cleaning of exam room while observing appropriate contact time as indicated for disinfecting solutions.  Subjective:     Patient ID: Meagan [name] , female    DOB: 1961-08-13 , 61 y.o.   MRN: [id number]   Chief Complaint  Patient presents with   Diabetes   Hypertension    HPI  The patient is here today for a diabetes/HTN f/u. She reports compliance with meds. Admits she is not yet exercising as much as she should. She denies headaches, chest pain and shortness of breath.  Diabetes She presents for her follow-up diabetic visit. She has type 2 diabetes mellitus. Her disease course has been improving. Pertinent negatives for hypoglycemia include no hunger. Pertinent negatives for diabetes include no blurred vision and no visual change. There are no hypoglycemic complications. Symptoms are improving. Diabetic complications include nephropathy. Risk factors for coronary artery disease include hypertension, obesity and diabetes mellitus. Current diabetic treatment includes insulin injections and oral agent (monotherapy). She is compliant with treatment most of the time. She is following a generally healthy diet. She has not had a previous visit with a dietitian. She participates in exercise intermittently. Her breakfast blood glucose is taken between 9-10 am. Her breakfast blood glucose range is generally 110-130 mg/dl. She does not see a podiatrist.Eye exam is current.  Hypertension This is a chronic problem. The current episode started more than 1 year ago. Pertinent negatives  include no blurred vision. Risk factors for coronary artery disease include diabetes mellitus, dyslipidemia, obesity, post-menopausal state and sedentary lifestyle. Past treatments include ACE inhibitors. Compliance problems include exercise.     Past Medical History:  Diagnosis Date   Arthritis    Chronic kidney disease    Complication of anesthesia    Diabetes mellitus (HCC)    GERD (gastroesophageal reflux disease)    Hyperlipidemia    Hypertension    PONV (postoperative nausea and vomiting)      Family History  Problem Relation Age of Onset   Kidney failure Mother    Liver disease Mother    Early death Father    Hypertension Brother    Colon cancer Neg Hx    Stomach cancer Neg Hx      Current Outpatient Medications:    amLODipine (NORVASC) 5 MG tablet, TAKE 1 TABLET BY MOUTH EVERY DAY, Disp: 90 tablet, Rfl: 1   aspirin EC 81 MG tablet, Take 1 tablet (81 mg total) by mouth daily. Swallow whole., Disp: 150 tablet, Rfl: 2   atorvastatin (LIPITOR) 40 MG tablet, Take 1 tablet (40 mg total) by mouth daily., Disp: 30 tablet, Rfl: 11   Blood Glucose Monitoring Suppl (ONETOUCH VERIO FLEX SYSTEM) w/Device KIT, Use as directed to check blood sugars 2 times per day dx: e11.65, Disp: 1 kit, Rfl: 1   Cholecalciferol (DIALYVITE VITAMIN D 5000 PO), Take 10,000 Units by mouth in the morning., Disp: , Rfl:    diclofenac Sodium (VOLTAREN) 1 % GEL, Use as directed, Disp: 4 g, Rfl: 1   ferrous sulfate 325 (65 FE) MG tablet, TAKE 1 TABLET BY MOUTH EVERY DAY WITH BREAKFAST (Patient taking  differently: Take 325 mg by mouth daily with breakfast.), Disp: 90 tablet, Rfl: 1   gabapentin (NEURONTIN) 300 MG capsule, Take 1 capsule (300 mg total) by mouth 3 (three) times daily. Take 2 capsules to equal 696m at night, Disp: 180 capsule, Rfl: 2   glucose blood (ONETOUCH VERIO) test strip, USE TO CHECK BLOOD SUGAR TWICE A DAY, Disp: 100 strip, Rfl: 12   insulin aspart (NOVOLOG FLEXPEN) 100 UNIT/ML FlexPen,  Inject 12 units 3 times per day with meals not to exceed 50 units, Disp: 15 pen, Rfl: 1   insulin degludec (TRESIBA FLEXTOUCH) 200 UNIT/ML FlexTouch Pen, INJECT 66 UNITS SUBCUTANEOUSLY DAILY, Disp: 200 mL, Rfl: 1   Insulin Pen Needle (B-D ULTRAFINE III SHORT PEN) 31G X 8 MM MISC, Use as directed with insulin pen, Disp: 100 each, Rfl: 3   JANUMET 50-1000 MG tablet, TAKE 1 TABLET BY MOUTH TWICE A DAY, Disp: 180 tablet, Rfl: 1   JARDIANCE 10 MG TABS tablet, TAKE 1 TABLET BY MOUTH EVERY DAY IN THE MORNING, Disp: 90 tablet, Rfl: 1   MAGNESIUM PO, Take 4 tablets by mouth daily., Disp: , Rfl:    Multiple Vitamin (MULTIVITAMIN WITH MINERALS) TABS tablet, Take 1 tablet by mouth daily., Disp: , Rfl:    OIL OF OREGANO PO, Take 2 capsules by mouth in the morning., Disp: , Rfl:    Omega-3 Fatty Acids (FISH OIL PO), Take 1 capsule by mouth in the morning., Disp: , Rfl:    PRILOSEC OTC 20 MG tablet, Take 20 mg by mouth daily. , Disp: , Rfl:    sertraline (ZOLOFT) 50 MG tablet, TAKE 1 TABLET BY MOUTH EVERY DAY, Disp: 90 tablet, Rfl: 0   valACYclovir (VALTREX) 500 MG tablet, Take 1 tablet (500 mg total) by mouth in the morning., Disp: 90 tablet, Rfl: 1   Allergies  Allergen Reactions   Promethazine Hcl Shortness Of Breath and Other (See Comments)    Bells palsy   Lisinopril Itching and Swelling     Review of Systems  Constitutional: Negative.   Eyes:  Negative for blurred vision.  Respiratory: Negative.    Cardiovascular: Negative.   Gastrointestinal: Negative.   Neurological: Negative.   Psychiatric/Behavioral: Negative.      Today's Vitals   01/08/22 0904  BP: 118/76  Pulse: 82  Temp: 98 F (36.7 C)  Weight: 191 lb (86.6 kg)  Height: _0  (1.6 m)   Body mass index is 33.83 kg/m.  Wt Readings from Last 3 Encounters:  01/08/22 191 lb (86.6 kg)  12/03/21 188 lb (85.3 kg)  09/03/21 182 lb 9.6 oz (82.8 kg)    BP Readings from Last 3 Encounters:  01/08/22 118/76  12/03/21 128/70  09/03/21  (!) 90/58    Objective:  Physical Exam Vitals and nursing note reviewed.  Constitutional:      Appearance: Normal appearance.  HENT:     Head: Normocephalic and atraumatic.     Nose:     Comments: MASKED     Mouth/Throat:     Comments: MASKED  Eyes:     Extraocular Movements: Extraocular movements intact.  Cardiovascular:     Rate and Rhythm: Normal rate and regular rhythm.     Heart sounds: Normal heart sounds.  Pulmonary:     Effort: Pulmonary effort is normal.     Breath sounds: Normal breath sounds.  Musculoskeletal:     Cervical back: Normal range of motion.  Skin:    General: Skin is warm.  Neurological:     General: No focal deficit present.     Mental Status: She is alert.  Psychiatric:        Mood and Affect: Mood normal.        Behavior: Behavior normal.        Assessment And Plan:     1. Type 2 diabetes mellitus with stage 2 chronic kidney disease, with long-term current use of insulin (HCC) Comments: I will add Ozempic 0.44m weekly. She was advised to stop Janumet.  She denies family/personal h/o thyroid cancer. She will f/u in 4 weeks.  - BMP8+EGFR  2. Benign hypertensive renal disease Comments: Chronic, well controlled. Advised to follow low sodium diet.  - BMP8+EGFR  3. PVD (peripheral vascular disease) (HCC) Comments: Chronic, importance of statin therapy and ASA was discussed with the patient. Encouraged to aim for at least 150 minutes of exercise per week.   4. Class 1 obesity due to excess calories with serious comorbidity and body mass index (BMI) of 33.0 to 33.9 in adult Comments: She is encouraged to strive for BMI less than 30 to decrease cardiac risk. Advised to aim for at least 150 minutes of exercise per week.    5. Need for vaccination Comments: She was given Shingrix IM x 1.   WE DISCUSSED STARTING OZEMPIC TO HELP HER ACHIEVE GLYCEMIC CONTROL. SHE WILL START WITH 10 CLICKS ONCE WEEKLY ON THURSDAYS, THEN INCREASE TO 0.25MG ONCE WEEKLY X  4 WEEKS, THEN 0.5MG ONCE WEEKLY X 2 WEEKS. SHE HAS TOLERATED GLP-1 BEFORE. SHE DENIES FAMILY HISTORY OF THYROID CANCER. SHE WILL RTO IN SIX WEEKS FOR RE-EVALUATION.  Patient was given opportunity to ask questions. Patient verbalized understanding of the plan and was able to repeat key elements of the plan. All questions were answered to their satisfaction.   I, RMaximino Greenland MD, have reviewed all documentation for this visit. The documentation on 01/08/22 for the exam, diagnosis, procedures, and orders are all accurate and complete.   IF YOU HAVE BEEN REFERRED TO A SPECIALIST, IT MAY TAKE 1-2 WEEKS TO SCHEDULE/PROCESS THE REFERRAL. IF YOU HAVE NOT HEARD FROM US/SPECIALIST IN TWO WEEKS, PLEASE GIVE [facility] CALL AT [phone].   THE PATIENT IS ENCOURAGED TO PRACTICE SOCIAL DISTANCING DUE TO THE COVID-19 PANDEMIC.

## 2022-01-13 ENCOUNTER — Other Ambulatory Visit: Payer: Self-pay | Admitting: Nurse Practitioner

## 2022-01-13 DIAGNOSIS — F419 Anxiety disorder, unspecified: Secondary | ICD-10-CM

## 2022-01-24 LAB — HM MAMMOGRAPHY

## 2022-01-28 ENCOUNTER — Encounter: Payer: Self-pay | Admitting: Internal Medicine

## 2022-02-04 ENCOUNTER — Other Ambulatory Visit: Payer: Self-pay

## 2022-02-04 MED ORDER — NOVOLOG FLEXPEN 100 UNIT/ML ~~LOC~~ SOPN: PEN_INJECTOR | SUBCUTANEOUS | 1 refills | Status: DC

## 2022-02-16 ENCOUNTER — Other Ambulatory Visit: Payer: Self-pay | Admitting: Internal Medicine

## 2022-03-06 ENCOUNTER — Ambulatory Visit: Payer: BC Managed Care – PPO | Admitting: Internal Medicine

## 2022-03-06 NOTE — Progress Notes (Signed)
?HISTORY AND PHYSICAL  ? ? ? ?CC:  follow up. ?Requesting Provider:  Glendale Chard, MD ? ?HPI: This is a 61 y.o. 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  ? ?Pt was last seen 09/03/2021 and at that time all incisions had healed and the wounds on her fingers were improving and she did not have any hand pain, numbness or weakness.  She was planning on making appt with hand surgery.  She had a palpable left radial pulse.   ? ?She was initially referred to the office due to bilateral lower extremity calf claudication.  She had recently cut her right third toenail too short and developed an infection.  She was started on antibiotics by her PCP and the wound was healing.  She was to see her podiatrist later in the month. ? ?Pt returns today for follow up.   ? ?Pt denies any amaurosis fugax, speech difficulties, weakness, numbness, paralysis or clumsiness or facial droop.   She states that she has some memory issues but overall doing well.  She states that her right arm gets tired with activity.  She does not have any wounds on her right hand.  She states that the wounds on her left hand have completely healed.  She is very thankful she still has her hand.   ? ?She states she is still getting cramping in her calves when she walks about 200 yards but it stops and she can walk the same distance again.  She does not have any wounds or rest pain.   ? ? ?The pt is on a statin for cholesterol management.  ?The pt is on a daily aspirin.   Other AC:  none ?The pt is on CCB for hypertension.   ?The pt is diabetic.   ?Tobacco hx:  never ? ? ?Past Medical History:  ?Diagnosis Date  ? Arthritis   ? Chronic kidney disease   ? Complication of anesthesia   ? Diabetes mellitus ([facility])   ? GERD (gastroesophageal reflux disease)   ? Hyperlipidemia   ? Hypertension   ? PONV (postoperative nausea and  vomiting)   ? ? ?Past Surgical History:  ?Procedure Laterality Date  ? AORTIC ARCH ANGIOGRAPHY N/A 08/01/2021  ? Procedure: AORTIC ARCH ANGIOGRAPHY;  Surgeon: Marty Heck, MD;  Location: [facility];  Service: Cardiovascular;  Laterality: N/A;  ? CAROTID-SUBCLAVIAN BYPASS GRAFT Left 08/12/2021  ? Procedure: LEFT CAROTID-BRACHIAL ARTERY BYPASS using Left greater saphenous Vein, harvested from left leg.;  Surgeon: Marty Heck, MD;  Location: [facility];  Service: Vascular;  Laterality: Left;  ? CARPAL TUNNEL RELEASE Left 01/31/2021  ? CARPAL TUNNEL RELEASE Right 2021  ? Dr. Mardelle Matte  ? DILATION AND CURETTAGE OF UTERUS  2007  ? ROTATOR CUFF REPAIR Right 2018  ? Dr. Mardelle Matte  ? TUBAL LIGATION  1992  ? UPPER EXTREMITY ANGIOGRAPHY Left 08/01/2021  ? Procedure: UPPER EXTREMITY ANGIOGRAPHY;  Surgeon: Marty Heck, MD;  Location: [facility];  Service: Cardiovascular;  Laterality: Left;  ? ? ?Allergies  ?Allergen Reactions  ? Promethazine Hcl Shortness Of Breath and Other (See Comments)  ?  Bells palsy  ? Lisinopril Itching and Swelling  ? ? ?Current Outpatient Medications  ?Medication Sig Dispense Refill  ? amLODipine (NORVASC) 5 MG tablet TAKE 1 TABLET BY MOUTH EVERY DAY 90 tablet  1  ? aspirin EC 81 MG tablet Take 1 tablet (81 mg total) by mouth daily. Swallow whole. 150 tablet 2  ? atorvastatin (LIPITOR) 40 MG tablet Take 1 tablet (40 mg total) by mouth daily. 30 tablet 11  ? Blood Glucose Monitoring Suppl (ONETOUCH VERIO FLEX SYSTEM) w/Device KIT Use as directed to check blood sugars 2 times per day dx: e11.65 1 kit 1  ? Cholecalciferol (DIALYVITE VITAMIN D 5000 PO) Take 10,000 Units by mouth in the morning.    ? diclofenac Sodium (VOLTAREN) 1 % GEL Use as directed 4 g 1  ? ferrous sulfate 325 (65 FE) MG tablet TAKE 1 TABLET BY MOUTH EVERY DAY WITH BREAKFAST (Patient taking differently: Take 325 mg by mouth daily with breakfast.) 90 tablet 1  ? gabapentin (NEURONTIN) 300 MG capsule TAKE 1  CAPSULE BY MOUTH 3 (THREE) TIMES DAILY. TAKE 2 CAPSULES TO EQUAL 600MG AT NIGHT 180 capsule 2  ? glucose blood (ONETOUCH VERIO) test strip USE TO CHECK BLOOD SUGAR TWICE A DAY 100 strip 12  ? insulin aspart (NOVOLOG FLEXPEN) 100 UNIT/ML FlexPen Inject 12 units 3 times per day with meals not to exceed 50 units 15 mL 1  ? insulin degludec (TRESIBA FLEXTOUCH) 200 UNIT/ML FlexTouch Pen INJECT 66 UNITS SUBCUTANEOUSLY DAILY 200 mL 1  ? Insulin Pen Needle (B-D ULTRAFINE III SHORT PEN) 31G X 8 MM MISC Use as directed with insulin pen 100 each 3  ? JANUMET 50-1000 MG tablet TAKE 1 TABLET BY MOUTH TWICE A DAY 180 tablet 1  ? JARDIANCE 10 MG TABS tablet TAKE 1 TABLET BY MOUTH EVERY DAY IN THE MORNING 90 tablet 1  ? MAGNESIUM PO Take 4 tablets by mouth daily.    ? Multiple Vitamin (MULTIVITAMIN WITH MINERALS) TABS tablet Take 1 tablet by mouth daily.    ? OIL OF OREGANO PO Take 2 capsules by mouth in the morning.    ? Omega-3 Fatty Acids (FISH OIL PO) Take 1 capsule by mouth in the morning.    ? PRILOSEC OTC 20 MG tablet Take 20 mg by mouth daily.     ? sertraline (ZOLOFT) 50 MG tablet TAKE 1 TABLET BY MOUTH EVERY DAY 90 tablet 0  ? valACYclovir (VALTREX) 500 MG tablet Take 1 tablet (500 mg total) by mouth in the morning. 90 tablet 1  ? ?No current facility-administered medications for this visit.  ? ? ?Family History  ?Problem Relation Age of Onset  ? Kidney failure Mother   ? Liver disease Mother   ? Early death Father   ? Hypertension Brother   ? Colon cancer Neg Hx   ? Stomach cancer Neg Hx   ? ? ?Social History  ? ?Socioeconomic History  ? Marital status: Single  ?  Spouse name: Not on file  ? Number of children: Not on file  ? Years of education: Not on file  ? Highest education level: Not on file  ?Occupational History  ? Not on file  ?Tobacco Use  ? Smoking status: Never  ? Smokeless tobacco: Never  ?Vaping Use  ? Vaping Use: Never used  ?Substance and Sexual Activity  ? Alcohol use: Yes  ?  Comment: occasional  ? Drug  use: Never  ? Sexual activity: Not on file  ?Other Topics Concern  ? Not on file  ?Social History Narrative  ? Not on file  ? ?Social Determinants of Health  ? ?Financial Resource Strain: Not on file  ?Food Insecurity: Not on  file  ?Transportation Needs: Not on file  ?Physical Activity: Not on file  ?Stress: Not on file  ?Social Connections: Not on file  ?Intimate Partner Violence: Not on file  ? ? ? ?REVIEW OF SYSTEMS:  ? ?_0  denotes positive finding, _1  denotes negative finding ?Cardiac  Comments:  ?Chest pain or chest pressure:    ?Shortness of breath upon exertion:    ?Short of breath when lying flat:    ?Irregular heart rhythm:    ?    ?Vascular    ?Pain in calf, thigh, or hip brought on by ambulation: x   ?Pain in feet at night that wakes you up from your sleep:  x She does get some cramping at night  ?Blood clot in your veins:    ?Leg swelling:     ?    ?Pulmonary    ?Oxygen at home:    ?Productive cough:     ?Wheezing:     ?    ?Neurologic    ?Sudden weakness in arms or legs:     ?Sudden numbness in arms or legs:     ?Sudden onset of difficulty speaking or slurred speech:    ?Temporary loss of vision in one eye:     ?Problems with dizziness:     ?    ?Gastrointestinal    ?Blood in stool:     ?Vomited blood:     ?    ?Genitourinary    ?Burning when urinating:     ?Blood in urine:    ?    ?Psychiatric    ?Major depression:     ?    ?Hematologic    ?Bleeding problems:    ?Problems with blood clotting too easily:    ?    ?Skin    ?Rashes or ulcers:    ?    ?Constitutional    ?Fever or chills:    ? ? ?PHYSICAL EXAMINATION: ? ?Today's Vitals  ? 03/12/22 0859  ?BP: (!) 146/76  ?Pulse: 85  ?Temp: (!) 96.9 ?F (36.1 ?C)  ?Weight: 196 lb 9.6 oz (89.2 kg)  ?Height: _2  (1.6 m)  ? ?Body mass index is 34.83 kg/m?. ? ? ?General:  WDWN in NAD; vital signs documented above ?Gait: Not observed ?HENT: WNL, normocephalic ?Pulmonary: normal non-labored breathing ?Cardiac: regular HR, without carotid bruits ?Abdomen: soft,  NT; aortic pulse is not palpable ?Skin: without rashes ?Vascular Exam/Pulses: ? Right Left  ?Radial Not palpable; monophasic doppler signal 2+ (normal)  ?Popliteal Unable to palpate Unable to palpate  ?DP monopha

## 2022-03-07 ENCOUNTER — Ambulatory Visit: Payer: BC Managed Care – PPO

## 2022-03-07 ENCOUNTER — Encounter (HOSPITAL_COMMUNITY): Payer: BC Managed Care – PPO

## 2022-03-07 ENCOUNTER — Other Ambulatory Visit (HOSPITAL_COMMUNITY): Payer: BC Managed Care – PPO

## 2022-03-12 ENCOUNTER — Ambulatory Visit (INDEPENDENT_AMBULATORY_CARE_PROVIDER_SITE_OTHER): Payer: BC Managed Care – PPO | Admitting: Physician Assistant

## 2022-03-12 ENCOUNTER — Encounter: Payer: Self-pay | Admitting: Physician Assistant

## 2022-03-12 ENCOUNTER — Ambulatory Visit (INDEPENDENT_AMBULATORY_CARE_PROVIDER_SITE_OTHER)
Admission: RE | Admit: 2022-03-12 | Discharge: 2022-03-12 | Disposition: A | Discharge status: Home / Self Care | Payer: BC Managed Care – PPO | Source: Ambulatory Visit | Attending: Physician Assistant | Admitting: Physician Assistant

## 2022-03-12 ENCOUNTER — Ambulatory Visit (HOSPITAL_COMMUNITY)
Admission: RE | Admit: 2022-03-12 | Discharge: 2022-03-12 | Disposition: A | Discharge status: Home / Self Care | Payer: BC Managed Care – PPO | Source: Ambulatory Visit | Attending: Physician Assistant | Admitting: Physician Assistant

## 2022-03-12 VITALS — BP 146/76 | HR 85 | Temp 96.9°F | Ht 63.0 in | Wt 196.6 lb

## 2022-03-12 DIAGNOSIS — I6523 Occlusion and stenosis of bilateral carotid arteries: Secondary | ICD-10-CM | POA: Insufficient documentation

## 2022-03-12 DIAGNOSIS — I739 Peripheral vascular disease, unspecified: Secondary | ICD-10-CM

## 2022-03-17 ENCOUNTER — Encounter: Payer: Self-pay | Admitting: Internal Medicine

## 2022-03-17 ENCOUNTER — Other Ambulatory Visit: Payer: Self-pay | Admitting: *Deleted

## 2022-03-17 ENCOUNTER — Ambulatory Visit: Payer: BC Managed Care – PPO | Admitting: Internal Medicine

## 2022-03-17 VITALS — BP 116/74 | HR 76 | Temp 97.7°F | Ht 63.0 in | Wt 197.0 lb

## 2022-03-17 DIAGNOSIS — Z6834 Body mass index (BMI) 34.0-34.9, adult: Secondary | ICD-10-CM

## 2022-03-17 DIAGNOSIS — E1122 Type 2 diabetes mellitus with diabetic chronic kidney disease: Secondary | ICD-10-CM | POA: Diagnosis not present

## 2022-03-17 DIAGNOSIS — N182 Chronic kidney disease, stage 2 (mild): Secondary | ICD-10-CM | POA: Diagnosis not present

## 2022-03-17 DIAGNOSIS — E6609 Other obesity due to excess calories: Secondary | ICD-10-CM

## 2022-03-17 DIAGNOSIS — Z23 Encounter for immunization: Secondary | ICD-10-CM | POA: Diagnosis not present

## 2022-03-17 DIAGNOSIS — I129 Hypertensive chronic kidney disease with stage 1 through stage 4 chronic kidney disease, or unspecified chronic kidney disease: Secondary | ICD-10-CM

## 2022-03-17 DIAGNOSIS — I6523 Occlusion and stenosis of bilateral carotid arteries: Secondary | ICD-10-CM

## 2022-03-17 DIAGNOSIS — Z794 Long term (current) use of insulin: Secondary | ICD-10-CM

## 2022-03-17 DIAGNOSIS — E66811 Obesity, class 1: Secondary | ICD-10-CM

## 2022-03-17 DIAGNOSIS — I739 Peripheral vascular disease, unspecified: Secondary | ICD-10-CM | POA: Diagnosis not present

## 2022-03-17 DIAGNOSIS — M79642 Pain in left hand: Secondary | ICD-10-CM

## 2022-03-17 MED ORDER — DICLOFENAC SODIUM 1 % EX GEL: CUTANEOUS | 2 refills | Status: DC

## 2022-03-17 MED ORDER — MOUNJARO 5 MG/0.5ML ~~LOC~~ SOAJ: 5.0000 mg | SUBCUTANEOUS | 0 refills | Status: DC

## 2022-03-17 NOTE — Progress Notes (Signed)
?Meagan [name],acting as a Education administrator for Meagan Greenland, MD.,have documented all relevant documentation on the behalf of Meagan Greenland, MD,as directed by  Meagan Greenland, MD while in the presence of Meagan Greenland, MD.  ?This visit occurred during the SARS-CoV-2 public health emergency.  Safety protocols were in place, including screening questions prior to the visit, additional usage of staff PPE, and extensive cleaning of exam room while observing appropriate contact time as indicated for disinfecting solutions. ? ?Subjective:  ?  ? Patient ID: Meagan [name] , female    DOB: November 11, 1961 , 61 y.o.   MRN: [id number] ? ? ?Chief Complaint  ?Patient presents with  ? Diabetes  ? ? ?HPI ? ?The patient is here today for a diabetes/HTN f/u. She reports compliance with meds. She denies headaches, chest pain and shortness of breath.Patient reports she was concerned the [place] working for her. She reports being hungry all the time and her blood sugar readings being really high. She reports one time her meter just said "high". She admits that she did not contact the office about her elevated blood sugars.  ? ?Diabetes ?She presents for her follow-up diabetic visit. She has type 2 diabetes mellitus. Her disease course has been improving. Pertinent negatives for hypoglycemia include no hunger. Pertinent negatives for diabetes include no blurred vision and no visual change. There are no hypoglycemic complications. Symptoms are improving. Diabetic complications include nephropathy. Risk factors for coronary artery disease include hypertension, obesity and diabetes mellitus. Current diabetic treatment includes insulin injections and oral agent (monotherapy). She is compliant with treatment most of the time. She is following a generally healthy diet. She has not had a previous visit with a dietitian. She participates in exercise intermittently. Her breakfast blood glucose is taken between 9-10 am. Her breakfast  blood glucose range is generally 110-130 mg/dl. She does not see a podiatrist.Eye exam is current.  ?Hypertension ?This is a chronic problem. The current episode started more than 1 year ago. Pertinent negatives include no blurred vision. Risk factors for coronary artery disease include diabetes mellitus, dyslipidemia, obesity, post-menopausal state and sedentary lifestyle. Past treatments include ACE inhibitors. Compliance problems include exercise.    ? ?Past Medical History:  ?Diagnosis Date  ? Arthritis   ? Chronic kidney disease   ? Complication of anesthesia   ? Diabetes mellitus ([place])   ? GERD (gastroesophageal reflux disease)   ? Hyperlipidemia   ? Hypertension   ? PONV (postoperative nausea and vomiting)   ?  ? ?Family History  ?Problem Relation Age of Onset  ? Kidney failure Mother   ? Liver disease Mother   ? Early death Father   ? Hypertension Brother   ? Colon cancer Neg Hx   ? Stomach cancer Neg Hx   ? ? ? ?Current Outpatient Medications:  ?  amLODipine (NORVASC) 5 MG tablet, TAKE 1 TABLET BY MOUTH EVERY DAY, Disp: 90 tablet, Rfl: 1 ?  aspirin EC 81 MG tablet, Take 1 tablet (81 mg total) by mouth daily. Swallow whole., Disp: 150 tablet, Rfl: 2 ?  atorvastatin (LIPITOR) 40 MG tablet, Take 1 tablet (40 mg total) by mouth daily., Disp: 30 tablet, Rfl: 11 ?  Blood Glucose Monitoring Suppl (ONETOUCH VERIO FLEX SYSTEM) w/Device KIT, Use as directed to check blood sugars 2 times per day dx: e11.65, Disp: 1 kit, Rfl: 1 ?  Cholecalciferol (DIALYVITE VITAMIN D 5000 PO), Take 10,000 Units by mouth in the morning., Disp: , Rfl:  ?  ferrous sulfate 325 (65 FE) MG tablet, TAKE 1 TABLET BY MOUTH EVERY DAY WITH BREAKFAST (Patient taking differently: Take 325 mg by mouth daily with breakfast.), Disp: 90 tablet, Rfl: 1 ?  gabapentin (NEURONTIN) 300 MG capsule, TAKE 1 CAPSULE BY MOUTH 3 (THREE) TIMES DAILY. TAKE 2 CAPSULES TO EQUAL 600MG AT NIGHT, Disp: 180 capsule, Rfl: 2 ?  glucose blood (ONETOUCH VERIO) test strip,  USE TO CHECK BLOOD SUGAR TWICE A DAY, Disp: 100 strip, Rfl: 12 ?  insulin aspart (NOVOLOG FLEXPEN) 100 UNIT/ML FlexPen, Inject 12 units 3 times per day with meals not to exceed 50 units, Disp: 15 mL, Rfl: 1 ?  insulin degludec (TRESIBA FLEXTOUCH) 200 UNIT/ML FlexTouch Pen, INJECT 66 UNITS SUBCUTANEOUSLY DAILY, Disp: 200 mL, Rfl: 1 ?  Insulin Pen Needle (B-D ULTRAFINE III SHORT PEN) 31G X 8 MM MISC, Use as directed with insulin pen, Disp: 100 each, Rfl: 3 ?  JARDIANCE 10 MG TABS tablet, TAKE 1 TABLET BY MOUTH EVERY DAY IN THE MORNING, Disp: 90 tablet, Rfl: 1 ?  MAGNESIUM PO, Take 4 tablets by mouth daily., Disp: , Rfl:  ?  Multiple Vitamin (MULTIVITAMIN WITH MINERALS) TABS tablet, Take 1 tablet by mouth daily., Disp: , Rfl:  ?  Omega-3 Fatty Acids (FISH OIL PO), Take 1 capsule by mouth in the morning., Disp: , Rfl:  ?  PRILOSEC OTC 20 MG tablet, Take 20 mg by mouth daily. , Disp: , Rfl:  ?  sertraline (ZOLOFT) 50 MG tablet, TAKE 1 TABLET BY MOUTH EVERY DAY, Disp: 90 tablet, Rfl: 0 ?  tirzepatide (MOUNJARO) 5 MG/0.5ML Pen, Inject 5 mg into the skin once a week., Disp: 2 mL, Rfl: 0 ?  valACYclovir (VALTREX) 500 MG tablet, Take 1 tablet (500 mg total) by mouth in the morning., Disp: 90 tablet, Rfl: 1 ?  diclofenac Sodium (VOLTAREN) 1 % GEL, Use as directed, apply to affected joints tid prn, Disp: 150 g, Rfl: 2  ? ?Allergies  ?Allergen Reactions  ? Promethazine Hcl Shortness Of Breath and Other (See Comments)  ?  Bells palsy  ? Lisinopril Itching and Swelling  ?  ? ?Review of Systems  ?Constitutional: Negative.   ?Eyes:  Negative for blurred vision.  ?Respiratory: Negative.    ?Cardiovascular: Negative.   ?Musculoskeletal:  Positive for arthralgias.  ?     She c/o joint pain in her hands. There is stiffness upon awakening.   ?Neurological: Negative.   ?Psychiatric/Behavioral: Negative.     ? ?Today's Vitals  ? 03/17/22 1208  ?BP: 116/74  ?Pulse: 76  ?Temp: 97.7 ?F (36.5 ?C)  ?Weight: 197 lb (89.4 kg)  ?Height: '5\' 3"'  (1.6  m)  ?PainSc: 5   ?PainLoc: Hip  ? ?Body mass index is 34.9 kg/m?.  ?Wt Readings from Last 3 Encounters:  ?03/17/22 197 lb (89.4 kg)  ?03/12/22 196 lb 9.6 oz (89.2 kg)  ?01/08/22 191 lb (86.6 kg)  ?  ? ?Objective:  ?Physical Exam ?Vitals and nursing note reviewed.  ?Constitutional:   ?   Appearance: Normal appearance.  ?HENT:  ?   Head: Normocephalic and atraumatic.  ?Eyes:  ?   Extraocular Movements: Extraocular movements intact.  ?Cardiovascular:  ?   Rate and Rhythm: Normal rate and regular rhythm.  ?   Heart sounds: Normal heart sounds.  ?Pulmonary:  ?   Effort: Pulmonary effort is normal.  ?   Breath sounds: Normal breath sounds.  ?Musculoskeletal:  ?   Cervical back: Normal range of motion.  ?  Skin: ?   General: Skin is warm.  ?Neurological:  ?   General: No focal deficit present.  ?   Mental Status: She is alert.  ?Psychiatric:     ?   Mood and Affect: Mood normal.     ?   Behavior: Behavior normal.  ?  ? ?   ?Assessment And Plan:  ?   ?1. Type 2 diabetes mellitus with stage 2 chronic kidney disease, with long-term current use of insulin ([place]) ?Comments: I will increase Tresiba to 70 units nightly. I will d/c Ozempic and start Mounjaro 2.70m weekly. She was given pen teaching today. She will f/u in 4 weeks. ?- Urine Albumin-Creatinine with uACR ?- Hemoglobin A1c ?- Lipid panel ? ?2. Benign hypertensive renal disease ?Comments: Chronic, well controlled. She is encouraged to follow low sodium diet and to avoid processed meats including bacon, sausages and deli meats.  ?- CMP14+EGFR ? ?3. Hand pain, left ?Comments: She is advised to apply Voltaren gel to MCP, PIP, DIP joints 2-3x/daily.  ?- diclofenac Sodium (VOLTAREN) 1 % GEL; Use as directed, apply to affected joints tid prn  Dispense: 150 g; Refill: 2 ? ?4. PVD (peripheral vascular disease) ([place] ?Comments: I will check lipid panel. LDL goal <70. She is encouraged to aim for at least 150 minutes of exercise per week.  ?- Lipid panel ? ?5. Class 1 obesity due to  excess calories with serious comorbidity and body mass index (BMI) of 34.0 to 34.9 in adult ?Comments: She is encouraged to incorporate at least 150 minutes of exercise per week, striving for BMI<30 to d

## 2022-03-17 NOTE — Patient Instructions (Signed)

## 2022-03-18 LAB — LIPID PANEL
Chol/HDL Ratio: 4.4 ratio (ref 0.0–4.4)
Cholesterol, Total: 197 mg/dL (ref 100–199)
HDL: 45 mg/dL (ref >39)
LDL Chol Calc (NIH): 131 mg/dL — ABNORMAL HIGH (ref 0–99)
Triglycerides: 116 mg/dL (ref 0–149)
VLDL Cholesterol Cal: 21 mg/dL (ref 5–40)

## 2022-03-18 LAB — CMP14+EGFR
ALT: 18 IU/L (ref 0–32)
AST: 12 IU/L (ref 0–40)
Albumin/Globulin Ratio: 1.1 — ABNORMAL LOW (ref 1.2–2.2)
Albumin: 3.8 g/dL (ref 3.8–4.9)
Alkaline Phosphatase: 135 IU/L — ABNORMAL HIGH (ref 44–121)
BUN/Creatinine Ratio: 17 (ref 12–28)
BUN: 19 mg/dL (ref 8–27)
Bilirubin Total: 0.2 mg/dL (ref 0.0–1.2)
CO2: 21 mmol/L (ref 20–29)
Calcium: 9.1 mg/dL (ref 8.7–10.3)
Chloride: 102 mmol/L (ref 96–106)
Creatinine, Ser: 1.09 mg/dL — ABNORMAL HIGH (ref 0.57–1.00)
Globulin, Total: 3.6 g/dL (ref 1.5–4.5)
Glucose: 247 mg/dL — ABNORMAL HIGH (ref 70–99)
Potassium: 5 mmol/L (ref 3.5–5.2)
Sodium: 137 mmol/L (ref 134–144)
Total Protein: 7.4 g/dL (ref 6.0–8.5)
eGFR: 58 mL/min/{1.73_m2} — ABNORMAL LOW (ref >59)

## 2022-03-18 LAB — MICROALBUMIN / CREATININE URINE RATIO
Creatinine, Urine: 58.8 mg/dL
Microalb/Creat Ratio: 217 mg/g creat — ABNORMAL HIGH (ref 0–29)
Microalbumin, Urine: 127.5 ug/mL

## 2022-03-18 LAB — HEMOGLOBIN A1C
Est. average glucose Bld gHb Est-mCnc: 269 mg/dL
Hgb A1c MFr Bld: 11 % — ABNORMAL HIGH (ref 4.8–5.6)

## 2022-03-30 ENCOUNTER — Other Ambulatory Visit: Payer: Self-pay | Admitting: Internal Medicine

## 2022-04-11 ENCOUNTER — Other Ambulatory Visit: Payer: Self-pay

## 2022-04-11 MED ORDER — ATORVASTATIN CALCIUM 80 MG PO TABS: 40.0000 mg | ORAL_TABLET | Freq: Every day | ORAL | 1 refills | Status: DC

## 2022-04-14 ENCOUNTER — Ambulatory Visit: Payer: BC Managed Care – PPO | Admitting: Internal Medicine

## 2022-04-20 ENCOUNTER — Other Ambulatory Visit: Payer: Self-pay | Admitting: Nurse Practitioner

## 2022-04-20 DIAGNOSIS — F419 Anxiety disorder, unspecified: Secondary | ICD-10-CM

## 2022-04-30 ENCOUNTER — Encounter: Payer: Self-pay | Admitting: Internal Medicine

## 2022-05-02 ENCOUNTER — Other Ambulatory Visit: Payer: Self-pay

## 2022-05-07 ENCOUNTER — Other Ambulatory Visit: Payer: Self-pay

## 2022-05-07 MED ORDER — OZEMPIC (2 MG/DOSE) 8 MG/3ML ~~LOC~~ SOPN: 2.0000 mg | PEN_INJECTOR | SUBCUTANEOUS | 3 refills | Status: DC

## 2022-05-17 ENCOUNTER — Other Ambulatory Visit: Payer: Self-pay | Admitting: Internal Medicine

## 2022-05-29 ENCOUNTER — Other Ambulatory Visit: Payer: Self-pay | Admitting: Internal Medicine

## 2022-06-19 ENCOUNTER — Encounter: Payer: BC Managed Care – PPO | Admitting: Internal Medicine

## 2022-06-24 ENCOUNTER — Other Ambulatory Visit: Payer: Self-pay

## 2022-06-24 DIAGNOSIS — E1122 Type 2 diabetes mellitus with diabetic chronic kidney disease: Secondary | ICD-10-CM

## 2022-06-24 MED ORDER — OZEMPIC (2 MG/DOSE) 8 MG/3ML ~~LOC~~ SOPN: 2.0000 mg | PEN_INJECTOR | SUBCUTANEOUS | 0 refills | Status: DC

## 2022-07-06 ENCOUNTER — Other Ambulatory Visit: Payer: Self-pay | Admitting: Internal Medicine

## 2022-07-12 ENCOUNTER — Encounter (HOSPITAL_BASED_OUTPATIENT_CLINIC_OR_DEPARTMENT_OTHER): Payer: Self-pay | Admitting: Emergency Medicine

## 2022-07-12 ENCOUNTER — Other Ambulatory Visit: Payer: Self-pay

## 2022-07-12 ENCOUNTER — Emergency Department (HOSPITAL_BASED_OUTPATIENT_CLINIC_OR_DEPARTMENT_OTHER)
Admission: EM | Admit: 2022-07-12 | Discharge: 2022-07-12 | Disposition: A | Discharge status: Home / Self Care | Payer: BC Managed Care – PPO | Attending: Emergency Medicine | Admitting: Emergency Medicine

## 2022-07-12 ENCOUNTER — Emergency Department (HOSPITAL_BASED_OUTPATIENT_CLINIC_OR_DEPARTMENT_OTHER): Payer: BC Managed Care – PPO

## 2022-07-12 DIAGNOSIS — E119 Type 2 diabetes mellitus without complications: Secondary | ICD-10-CM | POA: Diagnosis not present

## 2022-07-12 DIAGNOSIS — Z794 Long term (current) use of insulin: Secondary | ICD-10-CM | POA: Diagnosis not present

## 2022-07-12 DIAGNOSIS — Z7984 Long term (current) use of oral hypoglycemic drugs: Secondary | ICD-10-CM | POA: Insufficient documentation

## 2022-07-12 DIAGNOSIS — Z7982 Long term (current) use of aspirin: Secondary | ICD-10-CM | POA: Diagnosis not present

## 2022-07-12 DIAGNOSIS — E1169 Type 2 diabetes mellitus with other specified complication: Secondary | ICD-10-CM

## 2022-07-12 DIAGNOSIS — S61233A Puncture wound without foreign body of left middle finger without damage to nail, initial encounter: Secondary | ICD-10-CM | POA: Insufficient documentation

## 2022-07-12 DIAGNOSIS — S61209A Unspecified open wound of unspecified finger without damage to nail, initial encounter: Secondary | ICD-10-CM

## 2022-07-12 DIAGNOSIS — X58XXXA Exposure to other specified factors, initial encounter: Secondary | ICD-10-CM | POA: Diagnosis not present

## 2022-07-12 DIAGNOSIS — S6992XA Unspecified injury of left wrist, hand and finger(s), initial encounter: Secondary | ICD-10-CM | POA: Diagnosis present

## 2022-07-12 MED ORDER — CEPHALEXIN 500 MG PO CAPS: 500.0000 mg | ORAL_CAPSULE | Freq: Four times a day (QID) | ORAL | 0 refills | Status: DC

## 2022-07-12 MED ORDER — HYDROMORPHONE HCL 1 MG/ML IJ SOLN: 1.0000 mg | Freq: Once | INTRAMUSCULAR | Status: DC

## 2022-07-12 NOTE — ED Provider Notes (Signed)
[facility] EMERGENCY DEPT Provider Note   CSN: [id number] Arrival date & time: 07/12/22  1806     History  Chief Complaint  Patient presents with   Finger Injury    Meagan [name] is a 61 y.o. female.  HPI    61 year old female history of diabetes, history of finger infection, presents today with wound to her left middle finger.  She states that she she did a fingerstick at that time and has not had any wound on the finger since then.  Has some increased pain, mild swelling, no discharge noted.  She has a prior history of infection to index finger on the same hand.  She has previously had surgery for hand infection by Dr. Burney Gauze.  Home Medications Prior to Admission medications   Medication Sig Start Date End Date Taking? Authorizing Provider  cephALEXin (KEFLEX) 500 MG capsule Take 1 capsule (500 mg total) by mouth 4 (four) times daily. 07/12/22  Yes Pattricia Boss, MD  amLODipine (NORVASC) 5 MG tablet TAKE 1 TABLET BY MOUTH EVERY DAY 12/09/21   Glendale Chard, MD  aspirin EC 81 MG tablet Take 1 tablet (81 mg total) by mouth daily. Swallow whole. 08/01/21 08/01/22  Marty Heck, MD  atorvastatin (LIPITOR) 80 MG tablet Take 0.5 tablets (40 mg total) by mouth daily. 04/11/22   Glendale Chard, MD  Blood Glucose Monitoring Suppl ([place]) w/Device KIT Use as directed to check blood sugars 2 times per day dx: e11.65 04/09/21   Glendale Chard, MD  Cholecalciferol (DIALYVITE VITAMIN D 5000 PO) Take 10,000 Units by mouth in the morning.    [provider]  diclofenac Sodium (VOLTAREN) 1 % GEL Use as directed, apply to affected joints tid prn 03/17/22   Glendale Chard, MD  ferrous sulfate 325 (65 FE) MG tablet TAKE 1 TABLET BY MOUTH EVERY DAY WITH BREAKFAST Patient taking differently: Take 325 mg by mouth daily with breakfast. 07/26/21   Ghumman, Ramandeep, NP  gabapentin (NEURONTIN) 300 MG capsule TAKE 1 CAPSULE BY MOUTH 3 (THREE) TIMES DAILY.  TAKE 2 CAPSULES TO EQUAL 600MG AT NIGHT 02/17/22   Glendale Chard, MD  glucose blood [facility] VERIO) test strip USE TO CHECK BLOOD SUGAR TWICE A DAY 09/12/21   Minette Brine, FNP  insulin degludec (TRESIBA FLEXTOUCH) 200 UNIT/ML FlexTouch Pen INJECT 66 UNITS SUBCUTANEOUSLY DAILY 07/29/21   Glendale Chard, MD  Insulin Pen Needle (B-D ULTRAFINE III SHORT PEN) 31G X 8 MM MISC Use as directed with insulin pen 10/07/18   Glendale Chard, MD  JARDIANCE 10 MG TABS tablet TAKE 1 TABLET BY MOUTH EVERY DAY IN THE MORNING 05/19/22   Glendale Chard, MD  MAGNESIUM PO Take 4 tablets by mouth daily.    [provider]  Multiple Vitamin (MULTIVITAMIN WITH MINERALS) TABS tablet Take 1 tablet by mouth daily.    [provider]  NOVOLOG FLEXPEN 100 UNIT/ML FlexPen INJECT 12 UNITS 3 TIMES PER DAY WITH MEALS NOT TO EXCEED 50 UNITS 05/29/22   Glendale Chard, MD  Omega-3 Fatty Acids (FISH OIL PO) Take 1 capsule by mouth in the morning.    [provider]  PRILOSEC OTC 20 MG tablet Take 20 mg by mouth daily.  08/23/12   [provider]  Semaglutide, 2 MG/DOSE, (OZEMPIC, 2 MG/DOSE,) 8 MG/3ML SOPN Inject 2 mg into the skin once a week. 06/24/22   Glendale Chard, MD  sertraline (ZOLOFT) 50 MG tablet TAKE 1 TABLET BY MOUTH EVERY DAY 04/21/22  Minette Brine, FNP  tirzepatide [facility]) 5 MG/0.5ML Pen Inject 5 mg into the skin once a week. 03/17/22   Glendale Chard, MD  valACYclovir (VALTREX) 500 MG tablet TAKE 1 TABLET (500 MG TOTAL) BY MOUTH IN THE MORNING 07/07/22   Glendale Chard, MD      Allergies    Promethazine hcl and Lisinopril    Review of Systems   Review of Systems  Physical Exam Updated Vital Signs BP 113/88 (BP Location: Right Arm)   Pulse (!) 105   Temp 98.5 F (36.9 C) (Oral)   Resp 18   SpO2 100%  Physical Exam Vitals and nursing note reviewed.  HENT:     Head: Normocephalic.     Right Ear: External ear normal.     Left Ear: External ear normal.     Nose: Nose normal.   Eyes:     Pupils: Pupils are equal, round, and reactive to light.  Cardiovascular:     Rate and Rhythm: Normal rate.  Pulmonary:     Effort: Pulmonary effort is normal.     Breath sounds: Normal breath sounds.  Abdominal:     General: Abdomen is flat.     Palpations: Abdomen is soft.  Musculoskeletal:     Cervical back: Normal range of motion.     Comments: Left middle finger with wound noted to fingertip.  It has an eschar in place. There is no fluctuance, warmth, or redness noted There are some tenderness to palpation.  She is able to fully range that finger.  Neurological:     General: No focal deficit present.     Mental Status: She is alert.  Psychiatric:        Mood and Affect: Mood normal.     ED Results / Procedures / Treatments   Labs (all labs ordered are listed, but only abnormal results are displayed) Labs Reviewed - No data to display  EKG None  Radiology DG Finger Middle Left  Result Date: 07/12/2022 CLINICAL DATA:  Pain and discoloration to the distal third left finger after fingerstick to monitor blood sugar levels. EXAM: LEFT MIDDLE FINGER 2+V COMPARISON:  None Available. FINDINGS: There is no evidence of fracture or dislocation. There is no evidence of arthropathy or other focal bone abnormality. Soft tissues are unremarkable. IMPRESSION: Negative. Electronically Signed   By: Virgina Norfolk M.D.   On: 07/12/2022 19:27    Procedures Procedures    Medications Ordered in ED Medications - No data to display  ED Course/ Medical Decision Making/ A&P                           Medical Decision Making Amount and/or Complexity of Data Reviewed Radiology: ordered.   61 year old female with diabetes with prior finger infections presents today with poorly healing wound to the distal aspect of her second middle finger.  She is evaluated here with labs and no evidence of osteo is noted Plan Keflex, and have patient follow-up with her hand surgeon for  recheck next week       Final Clinical Impression(s) / ED Diagnoses Final diagnoses:  Open wound of finger, initial encounter  Type 2 diabetes mellitus with other specified complication, unspecified whether long term insulin use ([place])    Rx / DC Orders ED Discharge Orders          Ordered    cephALEXin (KEFLEX) 500 MG capsule  4 times daily  07/12/22 2008              Pattricia Boss, MD 07/12/22 2009

## 2022-07-12 NOTE — ED Notes (Signed)
Pt awake and alert lying in bed - GCS 15.  No acute distress noted.  L middle finger assessed at this time -- scab to tip of LUE 3rd digit noted with no signs of erythema - pt reporting sight feels tight and tender.  LUE distal neurovascular status remains intact.  Pt updated that imaging results are still pending - has been provided ice water per request - denies any additional questions, concerns, needs at this time.  Will monitor for acute changes and maintain plan of care.

## 2022-07-12 NOTE — Discharge Instructions (Signed)
Take antibiotics as prescribed Keep wound clean and dry Call Dr. Bertis Ruddy office next week for recheck Return if you are having worsening symptoms

## 2022-07-12 NOTE — ED Triage Notes (Signed)
Diabetic patient, attempted fingerstick to monitor blood sugar last week on left middle finger. Now reports with pain at site

## 2022-07-17 ENCOUNTER — Telehealth: Payer: Self-pay

## 2022-07-17 DIAGNOSIS — I998 Other disorder of circulatory system: Secondary | ICD-10-CM

## 2022-07-17 NOTE — Telephone Encounter (Signed)
Pt called stating the wound on the tip of her finger is not healing and her hand is cold.   Reviewed pt's chart, returned call for clarification, no answer, lf vm.  Pt called.  Returned pt's call, two identifiers used. Pt stated that she had tested her blood glucose on the tip of her L middle finger and over several days, the tip began turning a blackish color, with pain, gradually increasing in size, and cold hand/fingers. She stated the area is open, but she denies any drainage or foul odor. She went to the ED where she had x-rays and was given an antibiotic, which she is currently taking. Appts for [place] and [place] were scheduled. Confirmed understanding.

## 2022-07-22 ENCOUNTER — Ambulatory Visit: Payer: BC Managed Care – PPO | Admitting: Physician Assistant

## 2022-07-22 ENCOUNTER — Ambulatory Visit (HOSPITAL_COMMUNITY)
Admission: RE | Admit: 2022-07-22 | Discharge: 2022-07-22 | Disposition: A | Discharge status: Home / Self Care | Payer: BC Managed Care – PPO | Source: Ambulatory Visit | Attending: Vascular Surgery | Admitting: Vascular Surgery

## 2022-07-22 ENCOUNTER — Other Ambulatory Visit: Payer: Self-pay | Admitting: Vascular Surgery

## 2022-07-22 ENCOUNTER — Encounter: Payer: Self-pay | Admitting: Physician Assistant

## 2022-07-22 VITALS — Temp 97.8°F | Resp 20 | Ht 63.0 in | Wt 198.5 lb

## 2022-07-22 DIAGNOSIS — I998 Other disorder of circulatory system: Secondary | ICD-10-CM | POA: Insufficient documentation

## 2022-07-22 NOTE — Progress Notes (Signed)
[facility] HISTORY AND PHYSICAL   History of Present Illness:  Patient is a 61 y.o. year old female who presents for evaluation of new non healing left finger tip wounds.  She was first seen by DR. Clark with left finger tip wounds after a pedicure in Feb 2022.  She underwent aortic arch angiogram and ultimately had Left common carotid to brachial artery bypass with reversed left leg great saphenous vein on 08/12/21 by Dr. Carlis Abbott.  She was seen in our office 09/03/21 and at that time the finger tips were fully healed.  She come in today with reports of new finger tip ulcer after a blood sugar stick to the tip of the finger.  The tip is dry with gangrene.  She denise pain or loss of motor.    She has been exercising and monitoring her blood sugeas regularly.         Past Medical History:  Diagnosis Date   Arthritis    Chronic kidney disease    Complication of anesthesia    Diabetes mellitus (HCC)    GERD (gastroesophageal reflux disease)    Hyperlipidemia    Hypertension    PONV (postoperative nausea and vomiting)     Past Surgical History:  Procedure Laterality Date   AORTIC ARCH ANGIOGRAPHY N/A 08/01/2021   Procedure: AORTIC ARCH ANGIOGRAPHY;  Surgeon: Marty Heck, MD;  Location: [facility];  Service: Cardiovascular;  Laterality: N/A;   CAROTID-SUBCLAVIAN BYPASS GRAFT Left 08/12/2021   Procedure: LEFT CAROTID-BRACHIAL ARTERY BYPASS using Left greater saphenous Vein, harvested from left leg.;  Surgeon: Marty Heck, MD;  Location: [facility];  Service: Vascular;  Laterality: Left;   CARPAL TUNNEL RELEASE Left 01/31/2021   CARPAL TUNNEL RELEASE Right 2021   Dr. Mardelle Matte   DILATION AND CURETTAGE OF UTERUS  2007   ROTATOR CUFF REPAIR Right 2018   Dr. Mardelle Matte   TUBAL LIGATION  1992   UPPER EXTREMITY ANGIOGRAPHY Left 08/01/2021   Procedure: UPPER EXTREMITY ANGIOGRAPHY;  Surgeon: Marty Heck, MD;  Location: [facility];  Service:  Cardiovascular;  Laterality: Left;    ROS:   General:  No weight loss, Fever, chills  HEENT: No recent headaches, no nasal bleeding, no visual changes, no sore throat  Neurologic: No dizziness, blackouts, seizures. No recent symptoms of stroke or mini- stroke. No recent episodes of slurred speech, or temporary blindness.  Cardiac: No recent episodes of chest pain/pressure, no shortness of breath at rest.  No shortness of breath with exertion.  Denies history of atrial fibrillation or irregular heartbeat  Vascular: No history of rest pain in feet.  No history of claudication.  No history of non-healing ulcer, No history of DVT   Pulmonary: No home oxygen, no productive cough, no hemoptysis,  No asthma or wheezing  Musculoskeletal:  '[ ]'  Arthritis, '[ ]'  Low back pain,  '[ ]'  Joint pain  Hematologic:No history of hypercoagulable state.  No history of easy bleeding.  No history of anemia  Gastrointestinal: No hematochezia or melena,  No gastroesophageal reflux, no trouble swallowing  Urinary: '[ ]'  chronic Kidney disease, '[ ]'  on HD - '[ ]'  MWF or '[ ]'  TTHS, '[ ]'  Burning with urination, '[ ]'  Frequent urination, '[ ]'  Difficulty urinating;   Skin: No rashes  Psychological: No history of anxiety,  No history of depression  Social History Social History   Tobacco Use   Smoking status: Never   Smokeless tobacco: Never  Vaping Use   Vaping Use: Never used  Substance Use Topics   Alcohol use: Yes    Comment: occasional   Drug use: Never    Family History Family History  Problem Relation Age of Onset   Kidney failure Mother    Liver disease Mother    Early death Father    Hypertension Brother    Colon cancer Neg Hx    Stomach cancer Neg Hx     Allergies  Allergies  Allergen Reactions   Promethazine Hcl Shortness Of Breath and Other (See Comments)    Bells palsy   Lisinopril Itching and Swelling     Current Outpatient Medications  Medication Sig Dispense Refill   amLODipine  (NORVASC) 5 MG tablet TAKE 1 TABLET BY MOUTH EVERY DAY 90 tablet 1   aspirin EC 81 MG tablet Take 1 tablet (81 mg total) by mouth daily. Swallow whole. 150 tablet 2   atorvastatin (LIPITOR) 80 MG tablet Take 0.5 tablets (40 mg total) by mouth daily. 90 tablet 1   Blood Glucose Monitoring Suppl ([place]) w/Device KIT Use as directed to check blood sugars 2 times per day dx: e11.65 1 kit 1   cephALEXin (KEFLEX) 500 MG capsule Take 1 capsule (500 mg total) by mouth 4 (four) times daily. 20 capsule 0   Cholecalciferol (DIALYVITE VITAMIN D 5000 PO) Take 10,000 Units by mouth in the morning.     diclofenac Sodium (VOLTAREN) 1 % GEL Use as directed, apply to affected joints tid prn 150 g 2   ferrous sulfate 325 (65 FE) MG tablet TAKE 1 TABLET BY MOUTH EVERY DAY WITH BREAKFAST (Patient taking differently: Take 325 mg by mouth daily with breakfast.) 90 tablet 1   gabapentin (NEURONTIN) 300 MG capsule TAKE 1 CAPSULE BY MOUTH 3 (THREE) TIMES DAILY. TAKE 2 CAPSULES TO EQUAL 600MG AT NIGHT 180 capsule 2   glucose blood (ONETOUCH VERIO) test strip USE TO CHECK BLOOD SUGAR TWICE A DAY 100 strip 12   insulin degludec (TRESIBA FLEXTOUCH) 200 UNIT/ML FlexTouch Pen INJECT 66 UNITS SUBCUTANEOUSLY DAILY 200 mL 1   Insulin Pen Needle (B-D ULTRAFINE III SHORT PEN) 31G X 8 MM MISC Use as directed with insulin pen 100 each 3   JARDIANCE 10 MG TABS tablet TAKE 1 TABLET BY MOUTH EVERY DAY IN THE MORNING 90 tablet 1   MAGNESIUM PO Take 4 tablets by mouth daily.     Multiple Vitamin (MULTIVITAMIN WITH MINERALS) TABS tablet Take 1 tablet by mouth daily.     NOVOLOG FLEXPEN 100 UNIT/ML FlexPen INJECT 12 UNITS 3 TIMES PER DAY WITH MEALS NOT TO EXCEED 50 UNITS 15 mL 1   Omega-3 Fatty Acids (FISH OIL PO) Take 1 capsule by mouth in the morning.     PRILOSEC OTC 20 MG tablet Take 20 mg by mouth daily.      Semaglutide, 2 MG/DOSE, (OZEMPIC, 2 MG/DOSE,) 8 MG/3ML SOPN Inject 2 mg into the skin once a week. 3 mL 0    sertraline (ZOLOFT) 50 MG tablet TAKE 1 TABLET BY MOUTH EVERY DAY 90 tablet 0   valACYclovir (VALTREX) 500 MG tablet TAKE 1 TABLET (500 MG TOTAL) BY MOUTH IN THE MORNING 90 tablet 1   No current facility-administered medications for this visit.    Physical Examination  Vitals:   07/22/22 1236  Resp: 20  Temp: 97.8 F (36.6 C)  TempSrc: Temporal  Weight: 198 lb 8 oz (90 kg)  Height: '5\' 3"'  (1.6  m)    Body mass index is 35.16 kg/m.  General:  Alert and oriented, no acute distress HEENT: Normal Neck: No bruit or JVD Pulmonary: Clear to auscultation bilaterally Cardiac: Regular Rate and Rhythm without murmur Abdomen: Soft, non-tender, non-distended, no mass, no scars Skin: No rash.  Dry gangrene without infection signs to first and third digits left UE.     Extremity Pulses:   femoral  pulses bilaterally Musculoskeletal: No deformity or edema  Neurologic: Upper and lower extremity motor 5/5 and symmetric  DATA:    Left Doppler Findings:  +-------------+----------+----------+--------+--------+  Site         PSV (cm/s)Waveform  StenosisComments  +-------------+----------+----------+--------+--------+  Brachial Prox27        monophasic                  +-------------+----------+----------+--------+--------+  Brachial Mid 24        monophasic                  +-------------+----------+----------+--------+--------+  Brachial Dist43        monophasic                  +-------------+----------+----------+--------+--------+  Radial Dist  14        monophasic                  +-------------+----------+----------+--------+--------+  Ulnar Dist   19        monophasic                  +-------------+----------+----------+--------+--------+          Left Graft #1: carotid to brachial  +--------------------+--------+--------+--------+--------+                      PSV cm/sStenosisWaveformComments   +--------------------+--------+--------+--------+--------+  Inflow                                                +--------------------+--------+--------+--------+--------+  Proximal Anastomosis                                  +--------------------+--------+--------+--------+--------+  Proximal Graft      100                               +--------------------+--------+--------+--------+--------+  Mid Graft                                             +--------------------+--------+--------+--------+--------+  Distal Graft                                          +--------------------+--------+--------+--------+--------+  Distal Anastamosis                                    +--------------------+--------+--------+--------+--------+  Outflow                                               +--------------------+--------+--------+--------+--------+  Summary:     Left: No color or spectral Doppler flow beyond the proximal segment        (adjacent to the anastomosis) noted in the carotid to brachial        bypass graft.        Dampened monophasic waveforms in the radial and ulnar        arteries.        Of note, the left proximal ICA velocities (278/61 cm/s)        suggest high end 40-59% stenosis.  *See table(s) above for measurements and observations.   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    She denies fever, chills, or pain in the left UE.  Motor intact left UE.  Dry gangrene changes to first and third fingers.    Left UE occluded left carotis to brachial artery bypass with ischemic finger changes.    The left GSV was harvested for the bypass.  We will order right LE GSV mapping, CTA of the neck with known left ICA stenosis on duplex of  60-79% on previous duplex 03/2021.  I have then  scheduled her for angiogram with left UE runoff and possible intervention.  She may need revision verses new bypass pending further work up.     Patient seen in conjunction with Dr. Carlis Abbott today in office.  Continue current medically management with ASA, DM control and Statin daily.     Roxy Horseman PA-C [facility] Office: [phone]  MD in clinic [place]

## 2022-07-23 ENCOUNTER — Other Ambulatory Visit: Payer: Self-pay

## 2022-07-23 ENCOUNTER — Other Ambulatory Visit: Payer: Self-pay | Admitting: Nurse Practitioner

## 2022-07-23 ENCOUNTER — Other Ambulatory Visit (HOSPITAL_COMMUNITY): Payer: Self-pay | Admitting: Vascular Surgery

## 2022-07-23 ENCOUNTER — Ambulatory Visit (HOSPITAL_COMMUNITY)
Admission: RE | Admit: 2022-07-23 | Discharge: 2022-07-23 | Disposition: A | Discharge status: Home / Self Care | Payer: BC Managed Care – PPO | Source: Ambulatory Visit | Attending: Vascular Surgery | Admitting: Vascular Surgery

## 2022-07-23 DIAGNOSIS — I998 Other disorder of circulatory system: Secondary | ICD-10-CM

## 2022-07-23 DIAGNOSIS — Z01810 Encounter for preprocedural cardiovascular examination: Secondary | ICD-10-CM

## 2022-07-23 DIAGNOSIS — F419 Anxiety disorder, unspecified: Secondary | ICD-10-CM

## 2022-07-23 DIAGNOSIS — R609 Edema, unspecified: Secondary | ICD-10-CM

## 2022-07-29 ENCOUNTER — Other Ambulatory Visit: Payer: Self-pay

## 2022-07-29 DIAGNOSIS — I6523 Occlusion and stenosis of bilateral carotid arteries: Secondary | ICD-10-CM

## 2022-07-30 ENCOUNTER — Ambulatory Visit: Payer: BC Managed Care – PPO | Admitting: Internal Medicine

## 2022-07-30 ENCOUNTER — Encounter: Payer: Self-pay | Admitting: Internal Medicine

## 2022-07-30 VITALS — BP 112/72 | Temp 98.2°F | Wt 198.8 lb

## 2022-07-30 DIAGNOSIS — N182 Chronic kidney disease, stage 2 (mild): Secondary | ICD-10-CM | POA: Diagnosis not present

## 2022-07-30 DIAGNOSIS — I739 Peripheral vascular disease, unspecified: Secondary | ICD-10-CM

## 2022-07-30 DIAGNOSIS — E1122 Type 2 diabetes mellitus with diabetic chronic kidney disease: Secondary | ICD-10-CM | POA: Diagnosis not present

## 2022-07-30 DIAGNOSIS — I129 Hypertensive chronic kidney disease with stage 1 through stage 4 chronic kidney disease, or unspecified chronic kidney disease: Secondary | ICD-10-CM | POA: Diagnosis not present

## 2022-07-30 DIAGNOSIS — Z6835 Body mass index (BMI) 35.0-35.9, adult: Secondary | ICD-10-CM

## 2022-07-30 DIAGNOSIS — Z23 Encounter for immunization: Secondary | ICD-10-CM

## 2022-07-30 DIAGNOSIS — Z794 Long term (current) use of insulin: Secondary | ICD-10-CM

## 2022-07-30 DIAGNOSIS — E6609 Other obesity due to excess calories: Secondary | ICD-10-CM

## 2022-07-30 NOTE — Patient Instructions (Signed)

## 2022-07-30 NOTE — Progress Notes (Signed)
I,Meagan [name],acting as a scribe for Meagan Greenland, MD.,have documented all relevant documentation on the behalf of Meagan Greenland, MD,as directed by  Meagan Greenland, MD while in the presence of Meagan Greenland, MD.    Subjective:     Patient ID: Meagan [name] , female    DOB: 21-Nov-1961 , 61 y.o.   MRN: [id number]   Chief Complaint  Patient presents with   Diabetes   Hypertension    HPI  The patient is here today for a diabetes/HTN f/u. She reports compliance with meds. She denies headaches, chest pain and shortness of breath.  Unfortunately, she has cancelled her last two appts. Last visit was in April 2023.   She states Ozempic is doing well, she is not as nauseated   Diabetes She presents for her follow-up diabetic visit. She has type 2 diabetes mellitus. Her disease course has been improving. Pertinent negatives for hypoglycemia include no hunger. Pertinent negatives for diabetes include no blurred vision and no visual change. There are no hypoglycemic complications. Symptoms are improving. Diabetic complications include nephropathy. Risk factors for coronary artery disease include hypertension, obesity and diabetes mellitus. Current diabetic treatment includes insulin injections and oral agent (monotherapy). She is compliant with treatment most of the time. She is following a generally healthy diet. She has not had a previous visit with a dietitian. She participates in exercise intermittently. Her breakfast blood glucose is taken between 9-10 am. Her breakfast blood glucose range is generally 110-130 mg/dl. She does not see a podiatrist.Eye exam is current.  Hypertension This is a chronic problem. The current episode started more than 1 year ago. Pertinent negatives include no blurred vision. Risk factors for coronary artery disease include diabetes mellitus, dyslipidemia, obesity, post-menopausal state and sedentary lifestyle. Past treatments include ACE inhibitors.  Compliance problems include exercise.      Past Medical History:  Diagnosis Date   Arthritis    Chronic kidney disease    Complication of anesthesia    Diabetes mellitus (HCC)    GERD (gastroesophageal reflux disease)    Hyperlipidemia    Hypertension    Neuropathy    Pneumonia    PONV (postoperative nausea and vomiting)      Family History  Problem Relation Age of Onset   Kidney failure Mother    Liver disease Mother    Early death Father    Hypertension Brother    Colon cancer Neg Hx    Stomach cancer Neg Hx      Current Outpatient Medications:    atorvastatin (LIPITOR) 80 MG tablet, Take 0.5 tablets (40 mg total) by mouth daily. (Patient taking differently: Take 80 mg by mouth daily. Takes at hs), Disp: 90 tablet, Rfl: 1   Blood Glucose Monitoring Suppl ([place]) w/Device KIT, Use as directed to check blood sugars 2 times per day dx: e11.65, Disp: 1 kit, Rfl: 1   Cholecalciferol (DIALYVITE VITAMIN D 5000 PO), Take 10,000 Units by mouth in the morning., Disp: , Rfl:    diclofenac Sodium (VOLTAREN) 1 % GEL, Use as directed, apply to affected joints tid prn (Patient taking differently: Apply 1 Application topically 3 (three) times daily as needed (joint pain). Use as directed, apply to affected joints tid prn), Disp: 150 g, Rfl: 2   ferrous sulfate 325 (65 FE) MG tablet, TAKE 1 TABLET BY MOUTH EVERY DAY WITH BREAKFAST (Patient taking differently: Take 325 mg by mouth daily with breakfast.), Disp: 90 tablet, Rfl: 1  gabapentin (NEURONTIN) 300 MG capsule, TAKE 1 CAPSULE BY MOUTH 3 (THREE) TIMES DAILY. TAKE 2 CAPSULES TO EQUAL 600MG AT NIGHT, Disp: 180 capsule, Rfl: 2   glucose blood (ONETOUCH VERIO) test strip, USE TO CHECK BLOOD SUGAR TWICE A DAY, Disp: 100 strip, Rfl: 12   MAGNESIUM PO, Take 1 tablet by mouth daily., Disp: , Rfl:    Multiple Vitamin (MULTIVITAMIN WITH MINERALS) TABS tablet, Take 1 tablet by mouth daily., Disp: , Rfl:    NOVOLOG FLEXPEN 100  UNIT/ML FlexPen, INJECT 12 UNITS 3 TIMES PER DAY WITH MEALS NOT TO EXCEED 50 UNITS, Disp: 15 mL, Rfl: 1   Omega-3 Fatty Acids (FISH OIL PO), Take 1 capsule by mouth in the morning., Disp: , Rfl:    PRILOSEC OTC 20 MG tablet, Take 20 mg by mouth daily. , Disp: , Rfl:    Semaglutide, 2 MG/DOSE, (OZEMPIC, 2 MG/DOSE,) 8 MG/3ML SOPN, Inject 2 mg into the skin once a week. (Patient taking differently: Inject 2 mg into the skin every Tuesday.), Disp: 3 mL, Rfl: 0   sertraline (ZOLOFT) 50 MG tablet, TAKE 1 TABLET BY MOUTH EVERY DAY, Disp: 90 tablet, Rfl: 0   Turmeric 500 MG CAPS, Take 500 mg by mouth daily., Disp: , Rfl:    valACYclovir (VALTREX) 500 MG tablet, TAKE 1 TABLET (500 MG TOTAL) BY MOUTH IN THE MORNING, Disp: 90 tablet, Rfl: 1   amLODipine (NORVASC) 5 MG tablet, TAKE 1 TABLET BY MOUTH EVERY DAY, Disp: 90 tablet, Rfl: 1   aspirin EC 81 MG tablet, Take 1 tablet (81 mg total) by mouth daily. Swallow whole. (Patient taking differently: Take 81 mg by mouth daily. Swallow whole.  Takes at hs), Disp: 150 tablet, Rfl: 2   Empagliflozin-metFORMIN HCl ER (SYNJARDY XR) 25-1000 MG TB24, Take one tablet by mouth daily. START AFTER PROCEDURE., Disp: 90 tablet, Rfl: 1   insulin degludec (TRESIBA FLEXTOUCH) 200 UNIT/ML FlexTouch Pen, INJECT 66 UNITS SUBCUTANEOUSLY DAILY (Patient taking differently: Inject 70 Units into the skin every evening.), Disp: 200 mL, Rfl: 1   Insulin Pen Needle (B-D ULTRAFINE III SHORT PEN) 31G X 8 MM MISC, Use as directed with insulin pen, Disp: 100 each, Rfl: 3   oxyCODONE-acetaminophen (PERCOCET/ROXICET) 5-325 MG tablet, Take 1 tablet by mouth every 6 (six) hours as needed for moderate pain., Disp: 30 tablet, Rfl: 0   Allergies  Allergen Reactions   Promethazine Hcl Shortness Of Breath and Other (See Comments)    Bells palsy   Lisinopril Itching and Swelling     Review of Systems  Constitutional: Negative.   Eyes:  Negative for blurred vision.  Respiratory: Negative.     Cardiovascular: Negative.   Neurological: Negative.   Psychiatric/Behavioral: Negative.       Today's Vitals   07/30/22 1135  BP: 112/72  Temp: 98.2 F (36.8 C)  SpO2: 98%  Weight: 198 lb 12.8 oz (90.2 kg)  PainSc: 0-No pain   Body mass index is 35.22 kg/m.  Wt Readings from Last 3 Encounters:  08/13/22 198 lb (89.8 kg)  08/07/22 198 lb (89.8 kg)  07/30/22 198 lb 12.8 oz (90.2 kg)    Objective:  Physical Exam Vitals and nursing note reviewed.  Constitutional:      Appearance: Normal appearance.  HENT:     Head: Normocephalic and atraumatic.  Cardiovascular:     Rate and Rhythm: Normal rate and regular rhythm.     Heart sounds: Normal heart sounds.  Pulmonary:     Effort:  Pulmonary effort is normal.     Breath sounds: Normal breath sounds.  Skin:    General: Skin is warm.     Comments: Gangrenous changes left middle finger at the tip, index finger at nailbed  Neurological:     General: No focal deficit present.     Mental Status: She is alert.  Psychiatric:        Mood and Affect: Mood normal.        Behavior: Behavior normal.      Assessment And Plan:     1. Type 2 diabetes mellitus with stage 2 chronic kidney disease, with long-term current use of insulin (HCC) Comments: Chronic, she was given Dexcom sensors and advised how to follow sugars and apply sensors on her own. This will help to decrease fingersticks. Importance of compliance with office visits, diet and meds was stressed to the patient. If no improvement in her sugars, I will refer her to Endo for further evaluation.  I will check labs as below.  - CMP14+EGFR - CBC - Hemoglobin A1c - Lipid panel  2. Benign hypertensive renal disease Comments: Chronic, well controlled. She will c/w anti-hypertensive med regimen. Encouraged to follow low sodium diet.  - CMP14+EGFR  3. PVD (peripheral vascular disease) (HCC) Comments: Chronic, unfortunately she has had recurrence of sx in RUE. Importance of  med/dietary compliance was stressed to the patient. She has had recent Vascular evaluation, scheduled for revascularization procedure in the next week or two.   4. Class 2 obesity due to excess calories without serious comorbidity with body mass index (BMI) of 35.0 to 35.9 in adult Comments: She is encouraged to aim for at least 150 minutes of exercise per week, while striving for BMI<30 to decrease cardiac risk.   5. Immunization due - Tdap vaccine greater than or equal to 7yo IM  Patient was given opportunity to ask questions. Patient verbalized understanding of the plan and was able to repeat key elements of the plan. All questions were answered to their satisfaction.   I, Meagan Greenland, MD, have reviewed all documentation for this visit. The documentation on 07/30/22 for the exam, diagnosis, procedures, and orders are all accurate and complete.   IF YOU HAVE BEEN REFERRED TO A SPECIALIST, IT MAY TAKE 1-2 WEEKS TO SCHEDULE/PROCESS THE REFERRAL. IF YOU HAVE NOT HEARD FROM US/SPECIALIST IN TWO WEEKS, PLEASE GIVE [place] A CALL AT [phone].   THE PATIENT IS ENCOURAGED TO PRACTICE SOCIAL DISTANCING DUE TO THE COVID-19 PANDEMIC.

## 2022-07-31 LAB — CMP14+EGFR
ALT: 35 IU/L — ABNORMAL HIGH (ref 0–32)
AST: 22 IU/L (ref 0–40)
Albumin/Globulin Ratio: 1.1 — ABNORMAL LOW (ref 1.2–2.2)
Albumin: 3.9 g/dL (ref 3.9–4.9)
Alkaline Phosphatase: 132 IU/L — ABNORMAL HIGH (ref 44–121)
BUN/Creatinine Ratio: 16 (ref 12–28)
BUN: 16 mg/dL (ref 8–27)
Bilirubin Total: <0.2 mg/dL (ref 0.0–1.2)
CO2: 20 mmol/L (ref 20–29)
Calcium: 9.2 mg/dL (ref 8.7–10.3)
Chloride: 101 mmol/L (ref 96–106)
Creatinine, Ser: 0.97 mg/dL (ref 0.57–1.00)
Globulin, Total: 3.6 g/dL (ref 1.5–4.5)
Glucose: 249 mg/dL — ABNORMAL HIGH (ref 70–99)
Potassium: 5 mmol/L (ref 3.5–5.2)
Sodium: 138 mmol/L (ref 134–144)
Total Protein: 7.5 g/dL (ref 6.0–8.5)
eGFR: 66 mL/min/{1.73_m2} (ref >59)

## 2022-07-31 LAB — CBC
Hematocrit: 42.5 % (ref 34.0–46.6)
Hemoglobin: 13.7 g/dL (ref 11.1–15.9)
MCH: 27.3 pg (ref 26.6–33.0)
MCHC: 32.2 g/dL (ref 31.5–35.7)
MCV: 85 fL (ref 79–97)
Platelets: 422 10*3/uL (ref 150–450)
RBC: 5.01 x10E6/uL (ref 3.77–5.28)
RDW: 17.2 % — ABNORMAL HIGH (ref 11.7–15.4)
WBC: 8.6 10*3/uL (ref 3.4–10.8)

## 2022-07-31 LAB — LIPID PANEL
Chol/HDL Ratio: 3.6 ratio (ref 0.0–4.4)
Cholesterol, Total: 146 mg/dL (ref 100–199)
HDL: 41 mg/dL (ref >39)
LDL Chol Calc (NIH): 80 mg/dL (ref 0–99)
Triglycerides: 141 mg/dL (ref 0–149)
VLDL Cholesterol Cal: 25 mg/dL (ref 5–40)

## 2022-07-31 LAB — HEMOGLOBIN A1C
Est. average glucose Bld gHb Est-mCnc: 275 mg/dL
Hgb A1c MFr Bld: 11.2 % — ABNORMAL HIGH (ref 4.8–5.6)

## 2022-08-01 ENCOUNTER — Other Ambulatory Visit: Payer: Self-pay

## 2022-08-01 ENCOUNTER — Ambulatory Visit (HOSPITAL_COMMUNITY)
Admission: RE | Admit: 2022-08-01 | Discharge: 2022-08-01 | Disposition: A | Discharge status: Home / Self Care | Payer: BC Managed Care – PPO | Source: Ambulatory Visit | Attending: Vascular Surgery | Admitting: Vascular Surgery

## 2022-08-01 DIAGNOSIS — I6523 Occlusion and stenosis of bilateral carotid arteries: Secondary | ICD-10-CM | POA: Diagnosis present

## 2022-08-01 MED ORDER — IOHEXOL 350 MG/ML SOLN
75.0000 mL | Freq: Once | INTRAVENOUS | Status: AC | PRN
  Administered 2022-08-01: 75 mL via INTRAVENOUS

## 2022-08-01 MED ORDER — SYNJARDY XR 25-1000 MG PO TB24: ORAL_TABLET | ORAL | 1 refills | Status: DC

## 2022-08-07 ENCOUNTER — Encounter (HOSPITAL_COMMUNITY)
Admission: RE | Disposition: A | Discharge status: Home / Self Care | Payer: Self-pay | Source: Home / Self Care | Attending: Vascular Surgery

## 2022-08-07 ENCOUNTER — Encounter (HOSPITAL_COMMUNITY): Payer: Self-pay | Admitting: Vascular Surgery

## 2022-08-07 ENCOUNTER — Other Ambulatory Visit: Payer: Self-pay

## 2022-08-07 ENCOUNTER — Ambulatory Visit (HOSPITAL_COMMUNITY)
Admission: RE | Admit: 2022-08-07 | Discharge: 2022-08-07 | Disposition: A | Discharge status: Home / Self Care | Payer: BC Managed Care – PPO | Attending: Vascular Surgery | Admitting: Vascular Surgery

## 2022-08-07 DIAGNOSIS — Z7982 Long term (current) use of aspirin: Secondary | ICD-10-CM | POA: Insufficient documentation

## 2022-08-07 DIAGNOSIS — I6522 Occlusion and stenosis of left carotid artery: Secondary | ICD-10-CM

## 2022-08-07 DIAGNOSIS — I70268 Atherosclerosis of native arteries of extremities with gangrene, other extremity: Secondary | ICD-10-CM | POA: Insufficient documentation

## 2022-08-07 DIAGNOSIS — Z794 Long term (current) use of insulin: Secondary | ICD-10-CM | POA: Diagnosis not present

## 2022-08-07 DIAGNOSIS — E1152 Type 2 diabetes mellitus with diabetic peripheral angiopathy with gangrene: Secondary | ICD-10-CM | POA: Insufficient documentation

## 2022-08-07 DIAGNOSIS — T82898A Other specified complication of vascular prosthetic devices, implants and grafts, initial encounter: Secondary | ICD-10-CM

## 2022-08-07 DIAGNOSIS — I998 Other disorder of circulatory system: Secondary | ICD-10-CM

## 2022-08-07 DIAGNOSIS — I96 Gangrene, not elsewhere classified: Secondary | ICD-10-CM | POA: Diagnosis not present

## 2022-08-07 DIAGNOSIS — Z7985 Long-term (current) use of injectable non-insulin antidiabetic drugs: Secondary | ICD-10-CM | POA: Insufficient documentation

## 2022-08-07 HISTORY — PX: AORTIC ARCH ANGIOGRAPHY: CATH118224

## 2022-08-07 HISTORY — PX: UPPER EXTREMITY ANGIOGRAPHY: CATH118270

## 2022-08-07 LAB — POCT I-STAT, CHEM 8
BUN: 19 mg/dL (ref 8–23)
Calcium, Ion: 1.21 mmol/L (ref 1.15–1.40)
Chloride: 108 mmol/L (ref 98–111)
Creatinine, Ser: 1.1 mg/dL — ABNORMAL HIGH (ref 0.44–1.00)
Glucose, Bld: 167 mg/dL — ABNORMAL HIGH (ref 70–99)
HCT: 37 % (ref 36.0–46.0)
Hemoglobin: 12.6 g/dL (ref 12.0–15.0)
Potassium: 4 mmol/L (ref 3.5–5.1)
Sodium: 141 mmol/L (ref 135–145)
TCO2: 22 mmol/L (ref 22–32)

## 2022-08-07 LAB — GLUCOSE, CAPILLARY: Glucose-Capillary: 173 mg/dL — ABNORMAL HIGH (ref 70–99)

## 2022-08-07 SURGERY — UPPER EXTREMITY ANGIOGRAPHY
Anesthesia: LOCAL

## 2022-08-07 MED ORDER — IODIXANOL 320 MG/ML IV SOLN
INTRAVENOUS | Status: DC | PRN
  Administered 2022-08-07: 110 mL

## 2022-08-07 MED ORDER — SODIUM CHLORIDE 0.9% FLUSH: 3.0000 mL | Freq: Two times a day (BID) | INTRAVENOUS | Status: DC

## 2022-08-07 MED ORDER — HEPARIN (PORCINE) IN NACL 1000-0.9 UT/500ML-% IV SOLN
INTRAVENOUS | Status: AC
  Filled 2022-08-07: qty 1000

## 2022-08-07 MED ORDER — ONDANSETRON HCL 4 MG/2ML IJ SOLN: 4.0000 mg | Freq: Four times a day (QID) | INTRAMUSCULAR | Status: DC | PRN

## 2022-08-07 MED ORDER — SODIUM CHLORIDE 0.9 % IV SOLN: 250.0000 mL | INTRAVENOUS | Status: DC | PRN

## 2022-08-07 MED ORDER — ACETAMINOPHEN 325 MG PO TABS: 650.0000 mg | ORAL_TABLET | ORAL | Status: DC | PRN

## 2022-08-07 MED ORDER — HEPARIN (PORCINE) IN NACL 1000-0.9 UT/500ML-% IV SOLN
INTRAVENOUS | Status: DC | PRN
  Administered 2022-08-07 (×2): 500 mL

## 2022-08-07 MED ORDER — LIDOCAINE HCL (PF) 1 % IJ SOLN
INTRAMUSCULAR | Status: DC | PRN
  Administered 2022-08-07: 10 mL via SUBCUTANEOUS

## 2022-08-07 MED ORDER — SODIUM CHLORIDE 0.9 % IV SOLN: INTRAVENOUS | Status: DC

## 2022-08-07 MED ORDER — ASPIRIN 81 MG PO TBEC: 81.0000 mg | DELAYED_RELEASE_TABLET | Freq: Every day | ORAL | 2 refills | Status: AC

## 2022-08-07 MED ORDER — LIDOCAINE HCL (PF) 1 % IJ SOLN
INTRAMUSCULAR | Status: AC
  Filled 2022-08-07: qty 30

## 2022-08-07 MED ORDER — LABETALOL HCL 5 MG/ML IV SOLN: 10.0000 mg | INTRAVENOUS | Status: DC | PRN

## 2022-08-07 MED ORDER — SODIUM CHLORIDE 0.9% FLUSH: 3.0000 mL | INTRAVENOUS | Status: DC | PRN

## 2022-08-07 MED ORDER — HYDRALAZINE HCL 20 MG/ML IJ SOLN: 5.0000 mg | INTRAMUSCULAR | Status: DC | PRN

## 2022-08-07 SURGICAL SUPPLY — 10 items
CATH ANGIO 5F PIGTAIL 100CM (CATHETERS) IMPLANT
CATH OMNI FLUSH 5F 65CM (CATHETERS) IMPLANT
DEVICE CLOSURE MYNXGRIP 5F (Vascular Products) IMPLANT
KIT MICROPUNCTURE NIT STIFF (SHEATH) IMPLANT
KIT PV (KITS) ×3 IMPLANT
SHEATH PINNACLE 5F 10CM (SHEATH) IMPLANT
SYR MEDRAD MARK V 150ML (SYRINGE) IMPLANT
TRANSDUCER W/STOPCOCK (MISCELLANEOUS) ×3 IMPLANT
TRAY PV CATH (CUSTOM PROCEDURE TRAY) ×3 IMPLANT
WIRE BENTSON .035X145CM (WIRE) IMPLANT

## 2022-08-07 NOTE — H&P (Signed)
History and Physical Interval Note:  08/07/2022 7:47 AM  Meagan [name]  has presented today for surgery, with the diagnosis of ischemia left upper extremity.  The various methods of treatment have been discussed with the patient and family. After consideration of risks, benefits and other options for treatment, the patient has consented to  Procedure(s): Upper Extremity Angiography (Left) as a surgical intervention.  The patient's history has been reviewed, patient examined, no change in status, stable for surgery.  I have reviewed the patient's chart and labs.  Questions were answered to the patient's satisfaction.     Marty Heck  History of Present Illness:  Patient is a 61 y.o. year old female who presents for evaluation of new non healing left finger tip wounds.  She was first seen by DR. Roshanda Balazs with left finger tip wounds after a pedicure in Feb 2022.  She underwent aortic arch angiogram and ultimately had Left common carotid to brachial artery bypass with reversed left leg great saphenous vein on 08/12/21 by Dr. Carlis Abbott.  She was seen in our office 09/03/21 and at that time the finger tips were fully healed.             She come in today with reports of new finger tip ulcer after a blood sugar stick to the tip of the finger.  The tip is dry with gangrene.  She denise pain or loss of motor.               She has been exercising and monitoring her blood sugeas regularly.                   Past Medical History:  Diagnosis Date   Arthritis     Chronic kidney disease     Complication of anesthesia     Diabetes mellitus (HCC)     GERD (gastroesophageal reflux disease)     Hyperlipidemia     Hypertension     PONV (postoperative nausea and vomiting)             Past Surgical History:  Procedure Laterality Date   AORTIC ARCH ANGIOGRAPHY N/A 08/01/2021    Procedure: AORTIC ARCH ANGIOGRAPHY;  Surgeon: Marty Heck, MD;  Location: [facility];  Service:  Cardiovascular;  Laterality: N/A;   CAROTID-SUBCLAVIAN BYPASS GRAFT Left 08/12/2021    Procedure: LEFT CAROTID-BRACHIAL ARTERY BYPASS using Left greater saphenous Vein, harvested from left leg.;  Surgeon: Marty Heck, MD;  Location: [facility] OR;  Service: Vascular;  Laterality: Left;   CARPAL TUNNEL RELEASE Left 01/31/2021   CARPAL TUNNEL RELEASE Right 2021    Dr. Mardelle Matte   DILATION AND CURETTAGE OF UTERUS   2007   ROTATOR CUFF REPAIR Right 2018    Dr. Mardelle Matte   TUBAL LIGATION   1992   UPPER EXTREMITY ANGIOGRAPHY Left 08/01/2021    Procedure: UPPER EXTREMITY ANGIOGRAPHY;  Surgeon: Marty Heck, MD;  Location: [facility];  Service: Cardiovascular;  Laterality: Left;      ROS:    General:  No weight loss, Fever, chills   HEENT: No recent headaches, no nasal bleeding, no visual changes, no sore throat   Neurologic: No dizziness, blackouts, seizures. No recent symptoms of stroke or mini- stroke. No recent episodes of slurred speech, or temporary blindness.   Cardiac: No recent episodes of chest pain/pressure, no shortness of breath at rest.  No shortness of breath with exertion.  Denies history of atrial fibrillation or  irregular heartbeat   Vascular: No history of rest pain in feet.  No history of claudication.  No history of non-healing ulcer, No history of DVT    Pulmonary: No home oxygen, no productive cough, no hemoptysis,  No asthma or wheezing   Musculoskeletal:  [ ]  Arthritis, [ ]  Low back pain,  [ ]  Joint pain   Hematologic:No history of hypercoagulable state.  No history of easy bleeding.  No history of anemia   Gastrointestinal: No hematochezia or melena,  No gastroesophageal reflux, no trouble swallowing   Urinary: [ ]  chronic Kidney disease, [ ]  on HD - [ ]  MWF or [ ]  TTHS, [ ]  Burning with urination, [ ]  Frequent urination, [ ]  Difficulty urinating;    Skin: No rashes   Psychological: No history of anxiety,  No history of depression   Social  History Social History         Tobacco Use   Smoking status: Never   Smokeless tobacco: Never  Vaping Use   Vaping Use: Never used  Substance Use Topics   Alcohol use: Yes      Comment: occasional   Drug use: Never      Family History      Family History  Problem Relation Age of Onset   Kidney failure Mother     Liver disease Mother     Early death Father     Hypertension Brother     Colon cancer Neg Hx     Stomach cancer Neg Hx        Allergies        Allergies  Allergen Reactions   Promethazine Hcl Shortness Of Breath and Other (See Comments)      Bells palsy   Lisinopril Itching and Swelling              Current Outpatient Medications  Medication Sig Dispense Refill   amLODipine (NORVASC) 5 MG tablet TAKE 1 TABLET BY MOUTH EVERY DAY 90 tablet 1   aspirin EC 81 MG tablet Take 1 tablet (81 mg total) by mouth daily. Swallow whole. 150 tablet 2   atorvastatin (LIPITOR) 80 MG tablet Take 0.5 tablets (40 mg total) by mouth daily. 90 tablet 1   Blood Glucose Monitoring Suppl (Samson) w/Device KIT Use as directed to check blood sugars 2 times per day dx: e11.65 1 kit 1   cephALEXin (KEFLEX) 500 MG capsule Take 1 capsule (500 mg total) by mouth 4 (four) times daily. 20 capsule 0   Cholecalciferol (DIALYVITE VITAMIN D 5000 PO) Take 10,000 Units by mouth in the morning.       diclofenac Sodium (VOLTAREN) 1 % GEL Use as directed, apply to affected joints tid prn 150 g 2   ferrous sulfate 325 (65 FE) MG tablet TAKE 1 TABLET BY MOUTH EVERY DAY WITH BREAKFAST (Patient taking differently: Take 325 mg by mouth daily with breakfast.) 90 tablet 1   gabapentin (NEURONTIN) 300 MG capsule TAKE 1 CAPSULE BY MOUTH 3 (THREE) TIMES DAILY. TAKE 2 CAPSULES TO EQUAL 600MG  AT NIGHT 180 capsule 2   glucose blood (ONETOUCH VERIO) test strip USE TO CHECK BLOOD SUGAR TWICE A DAY 100 strip 12   insulin degludec (TRESIBA FLEXTOUCH) 200 UNIT/ML FlexTouch Pen INJECT 66 UNITS  SUBCUTANEOUSLY DAILY 200 mL 1   Insulin Pen Needle (B-D ULTRAFINE III SHORT PEN) 31G X 8 MM MISC Use as directed with insulin pen 100 each 3   JARDIANCE 10 MG TABS  tablet TAKE 1 TABLET BY MOUTH EVERY DAY IN THE MORNING 90 tablet 1   MAGNESIUM PO Take 4 tablets by mouth daily.       Multiple Vitamin (MULTIVITAMIN WITH MINERALS) TABS tablet Take 1 tablet by mouth daily.       NOVOLOG FLEXPEN 100 UNIT/ML FlexPen INJECT 12 UNITS 3 TIMES PER DAY WITH MEALS NOT TO EXCEED 50 UNITS 15 mL 1   Omega-3 Fatty Acids (FISH OIL PO) Take 1 capsule by mouth in the morning.       PRILOSEC OTC 20 MG tablet Take 20 mg by mouth daily.        Semaglutide, 2 MG/DOSE, (OZEMPIC, 2 MG/DOSE,) 8 MG/3ML SOPN Inject 2 mg into the skin once a week. 3 mL 0   sertraline (ZOLOFT) 50 MG tablet TAKE 1 TABLET BY MOUTH EVERY DAY 90 tablet 0   valACYclovir (VALTREX) 500 MG tablet TAKE 1 TABLET (500 MG TOTAL) BY MOUTH IN THE MORNING 90 tablet 1    No current facility-administered medications for this visit.      Physical Examination      Vitals:    07/22/22 1236  Resp: 20  Temp: 97.8 F (36.6 C)  TempSrc: Temporal  Weight: 198 lb 8 oz (90 kg)  Height: _0  (1.6 m)      Body mass index is 35.16 kg/m.   General:  Alert and oriented, no acute distress HEENT: Normal Neck: No bruit or JVD Pulmonary: Clear to auscultation bilaterally Cardiac: Regular Rate and Rhythm without murmur Abdomen: Soft, non-tender, non-distended, no mass, no scars Skin: No rash.  Dry gangrene without infection signs to first and third digits left UE.       Extremity Pulses:   femoral  pulses bilaterally Musculoskeletal: No deformity or edema      Neurologic: Upper and lower extremity motor 5/5 and symmetric   DATA:    Left Doppler Findings:  +-------------+----------+----------+--------+--------+  Site         PSV (cm/s)Waveform  StenosisComments  +-------------+----------+----------+--------+--------+  Brachial Prox27         monophasic                  +-------------+----------+----------+--------+--------+  Brachial Mid 24        monophasic                  +-------------+----------+----------+--------+--------+  Brachial Dist43        monophasic                  +-------------+----------+----------+--------+--------+  Radial Dist  14        monophasic                  +-------------+----------+----------+--------+--------+  Ulnar Dist   19        monophasic                  +-------------+----------+----------+--------+--------+          Left Graft #1: carotid to brachial  +--------------------+--------+--------+--------+--------+                      PSV cm/sStenosisWaveformComments  +--------------------+--------+--------+--------+--------+  Inflow                                                +--------------------+--------+--------+--------+--------+  Proximal Anastomosis                                  +--------------------+--------+--------+--------+--------+  Proximal Graft      100                               +--------------------+--------+--------+--------+--------+  Mid Graft                                             +--------------------+--------+--------+--------+--------+  Distal Graft                                          +--------------------+--------+--------+--------+--------+  Distal Anastamosis                                    +--------------------+--------+--------+--------+--------+  Outflow                                               +--------------------+--------+--------+--------+--------+      Summary:     Left: No color or spectral Doppler flow beyond the proximal segment        (adjacent to the anastomosis) noted in the carotid to brachial        bypass graft.        Dampened monophasic waveforms in the radial and ulnar        arteries.        Of note, the left proximal ICA  velocities (278/61 cm/s)        suggest high end 40-59% stenosis.  *See table(s) above for measurements and observations.    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               She denies fever, chills, or pain in the left UE.  Motor intact left UE.  Dry gangrene changes to first and third fingers.     Left UE occluded left carotis to brachial artery bypass with ischemic finger changes.               The left GSV was harvested for the bypass.  We will order right LE GSV mapping, CTA of the neck with known left ICA stenosis on duplex of  60-79% on previous duplex 03/2021.  I have then scheduled her for angiogram with left UE runoff and possible intervention.  She may need revision verses new bypass pending further work up.                 Patient seen in conjunction with Dr. Carlis Abbott today in office.   Continue current medically management with ASA, DM control and Statin daily.       Roxy Horseman PA-C [facility] Office: [phone]   MD in clinic [place]

## 2022-08-07 NOTE — Op Note (Signed)
    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  Ultrasound-guided access right common femoral artery 2.  Arch aortogram with left upper extremity arteriogram 3.  Mynx closure of the right common femoral artery  Indications: Patient is a 61 year old female that previously underwent a left common carotid to brachial artery bypass with saphenous vein on 08/12/2021 for critical limb ischemia with tissue loss in the left upper extremity.  Her wound ultimately healed.  She recently developed recurrent tissue loss after her wounds had previously healed.  Her bypass was found to be occluded.  She presents today to evaluate for a new target after risks benefits discussed.  Findings: Limited arch aortogram showed the left subclavian artery is patent and then occludes just distal to the vertebral artery.  The remainder of the left subclavian and left axillary artery is occluded.  The proximal brachial artery is occluded.  The left common carotid artery is widely patent without any flow-limiting stenosis.  There is a high-grade stenosis in the proximal left internal carotid artery >80%.  The left common carotid to brachial bypass is occluded and is not visualized.  The brachial artery reconstitutes in the mid upper arm but looks diseased and is healthier in the distal upper arm segment.  Widely patent radial and ulnar runoff into the hand.   Procedure:  The patient was identified in the holding area and taken to room 8.  The patient was then placed supine on the table and prepped and draped in the usual sterile fashion.  A time out was called.  Ultrasound was used to evaluate the right common femoral artery.  It was patent .  A digital ultrasound image  was acquired.  A micropuncture needle was used to access the right common femoral artery under ultrasound guidance.  An 018 wire was advanced without resistance and a micropuncture sheath was placed.  The 018 wire was removed and a benson wire was placed.  The micropuncture sheath was exchanged for a 5 french sheath.  A pigtail catheter was advanced into the ascending aorta.  A limited arch aortogram was obtained as well as left upper extremity arteriogram.  Pertinent findings are noted above.  Wires and catheters were removed.  A limited right femoral arteriogram was obtained.  Mynx closure deployed in the right common femoral artery.  Taken to holding in stable condition.  Plan: Patient will need a left carotid endarterectomy for high grade ICA stenosis with a redo left common carotid to brachial artery bypass.   Marty Heck, MD [facility] Office: [phone]

## 2022-08-08 ENCOUNTER — Other Ambulatory Visit: Payer: Self-pay

## 2022-08-08 ENCOUNTER — Encounter (HOSPITAL_COMMUNITY): Payer: Self-pay | Admitting: Vascular Surgery

## 2022-08-08 MED ORDER — BD PEN NEEDLE SHORT U/F 31G X 8 MM MISC: 3 refills | Status: DC

## 2022-08-10 ENCOUNTER — Encounter: Payer: Self-pay | Admitting: Internal Medicine

## 2022-08-11 ENCOUNTER — Encounter: Payer: Self-pay | Admitting: Internal Medicine

## 2022-08-12 ENCOUNTER — Encounter (HOSPITAL_COMMUNITY): Payer: Self-pay | Admitting: Vascular Surgery

## 2022-08-12 ENCOUNTER — Other Ambulatory Visit: Payer: Self-pay

## 2022-08-12 NOTE — Progress Notes (Signed)
Meagan [name] denies chest pain or shortness of breath. Patient denies having any s/s of Covid in her household, also denies any known exposure to Covid.   Meagan [name] has type II diabetes,  patient reports that CBG runs 150-175.  I instructed Meagan [name] to hold Jardiance today and in am. I instructed patient to take  35 units of Tresiba at hs.  Meagan [name] has a Dexcom Glucose reader.  I instructed patient to check CBG after awaking and every 2 hours until arrival  to the hospital. I Instructed patient if CBG is less than 70 to take 4 Glucose Tablets or 1 tube of Glucose Gel or 1/2 cup of a clear juice. Recheck CBG in 15 minutes if CBG is not over 70 call, pre- op desk at [phone] for further instructions.  Meagan [name]'s PCP is Dr.Robin [facility].  Patient has not seen a nephrologist.

## 2022-08-13 ENCOUNTER — Other Ambulatory Visit: Payer: Self-pay

## 2022-08-13 ENCOUNTER — Inpatient Hospital Stay (HOSPITAL_COMMUNITY): Payer: BC Managed Care – PPO

## 2022-08-13 ENCOUNTER — Encounter (HOSPITAL_COMMUNITY): Payer: Self-pay | Admitting: Vascular Surgery

## 2022-08-13 ENCOUNTER — Encounter (HOSPITAL_COMMUNITY)
Admission: RE | Disposition: A | Discharge status: Home / Self Care | Payer: Self-pay | Source: Home / Self Care | Attending: Vascular Surgery

## 2022-08-13 ENCOUNTER — Inpatient Hospital Stay (HOSPITAL_COMMUNITY)
Admission: RE | Admit: 2022-08-13 | Discharge: 2022-08-15 | DRG: 253 | Disposition: A | Discharge status: Home / Self Care | Payer: BC Managed Care – PPO | Attending: Vascular Surgery | Admitting: Vascular Surgery

## 2022-08-13 DIAGNOSIS — I70268 Atherosclerosis of native arteries of extremities with gangrene, other extremity: Secondary | ICD-10-CM | POA: Diagnosis present

## 2022-08-13 DIAGNOSIS — Z841 Family history of disorders of kidney and ureter: Secondary | ICD-10-CM

## 2022-08-13 DIAGNOSIS — Z8249 Family history of ischemic heart disease and other diseases of the circulatory system: Secondary | ICD-10-CM

## 2022-08-13 DIAGNOSIS — T82898A Other specified complication of vascular prosthetic devices, implants and grafts, initial encounter: Secondary | ICD-10-CM

## 2022-08-13 DIAGNOSIS — Z7982 Long term (current) use of aspirin: Secondary | ICD-10-CM | POA: Diagnosis not present

## 2022-08-13 DIAGNOSIS — K219 Gastro-esophageal reflux disease without esophagitis: Secondary | ICD-10-CM | POA: Diagnosis present

## 2022-08-13 DIAGNOSIS — E785 Hyperlipidemia, unspecified: Secondary | ICD-10-CM | POA: Diagnosis present

## 2022-08-13 DIAGNOSIS — I82B12 Acute embolism and thrombosis of left subclavian vein: Secondary | ICD-10-CM | POA: Diagnosis present

## 2022-08-13 DIAGNOSIS — I708 Atherosclerosis of other arteries: Secondary | ICD-10-CM | POA: Diagnosis present

## 2022-08-13 DIAGNOSIS — Z888 Allergy status to other drugs, medicaments and biological substances status: Secondary | ICD-10-CM

## 2022-08-13 DIAGNOSIS — Z7985 Long-term (current) use of injectable non-insulin antidiabetic drugs: Secondary | ICD-10-CM

## 2022-08-13 DIAGNOSIS — Z79899 Other long term (current) drug therapy: Secondary | ICD-10-CM

## 2022-08-13 DIAGNOSIS — I10 Essential (primary) hypertension: Secondary | ICD-10-CM | POA: Diagnosis present

## 2022-08-13 DIAGNOSIS — E1142 Type 2 diabetes mellitus with diabetic polyneuropathy: Secondary | ICD-10-CM | POA: Diagnosis present

## 2022-08-13 DIAGNOSIS — Z794 Long term (current) use of insulin: Secondary | ICD-10-CM

## 2022-08-13 DIAGNOSIS — E1152 Type 2 diabetes mellitus with diabetic peripheral angiopathy with gangrene: Principal | ICD-10-CM | POA: Diagnosis present

## 2022-08-13 DIAGNOSIS — I6522 Occlusion and stenosis of left carotid artery: Secondary | ICD-10-CM | POA: Diagnosis present

## 2022-08-13 DIAGNOSIS — I96 Gangrene, not elsewhere classified: Secondary | ICD-10-CM | POA: Diagnosis not present

## 2022-08-13 HISTORY — DX: Pneumonia, unspecified organism: J18.9

## 2022-08-13 HISTORY — DX: Polyneuropathy, unspecified: G62.9

## 2022-08-13 HISTORY — PX: ENDARTERECTOMY: SHX5162

## 2022-08-13 HISTORY — PX: CAROTID-SUBCLAVIAN BYPASS GRAFT: SHX910

## 2022-08-13 LAB — GLUCOSE, CAPILLARY
Glucose-Capillary: 144 mg/dL — ABNORMAL HIGH (ref 70–99)
Glucose-Capillary: 186 mg/dL — ABNORMAL HIGH (ref 70–99)
Glucose-Capillary: 172 mg/dL — ABNORMAL HIGH (ref 70–99)
Glucose-Capillary: 213 mg/dL — ABNORMAL HIGH (ref 70–99)

## 2022-08-13 LAB — URINALYSIS, ROUTINE W REFLEX MICROSCOPIC
Bilirubin Urine: NEGATIVE
Glucose, UA: >=500 mg/dL — AB
Hgb urine dipstick: NEGATIVE
Ketones, ur: NEGATIVE mg/dL
Leukocytes,Ua: NEGATIVE
Nitrite: NEGATIVE
Protein, ur: 30 mg/dL — AB
Specific Gravity, Urine: 1.025 (ref 1.005–1.030)
pH: 5 (ref 5.0–8.0)

## 2022-08-13 LAB — COMPREHENSIVE METABOLIC PANEL
ALT: 26 U/L (ref 0–44)
AST: 22 U/L (ref 15–41)
Albumin: 3.3 g/dL — ABNORMAL LOW (ref 3.5–5.0)
Alkaline Phosphatase: 85 U/L (ref 38–126)
Anion gap: 9 (ref 5–15)
BUN: 20 mg/dL (ref 8–23)
CO2: 21 mmol/L — ABNORMAL LOW (ref 22–32)
Calcium: 8.8 mg/dL — ABNORMAL LOW (ref 8.9–10.3)
Chloride: 108 mmol/L (ref 98–111)
Creatinine, Ser: 1.29 mg/dL — ABNORMAL HIGH (ref 0.44–1.00)
GFR, Estimated: 47 mL/min — ABNORMAL LOW (ref >60)
Glucose, Bld: 138 mg/dL — ABNORMAL HIGH (ref 70–99)
Potassium: 3.9 mmol/L (ref 3.5–5.1)
Sodium: 138 mmol/L (ref 135–145)
Total Bilirubin: 0.4 mg/dL (ref 0.3–1.2)
Total Protein: 7.8 g/dL (ref 6.5–8.1)

## 2022-08-13 LAB — CBC
HCT: 43.6 % (ref 36.0–46.0)
Hemoglobin: 13.4 g/dL (ref 12.0–15.0)
MCH: 27.1 pg (ref 26.0–34.0)
MCHC: 30.7 g/dL (ref 30.0–36.0)
MCV: 88.1 fL (ref 80.0–100.0)
Platelets: 433 10*3/uL — ABNORMAL HIGH (ref 150–400)
RBC: 4.95 MIL/uL (ref 3.87–5.11)
RDW: 18.2 % — ABNORMAL HIGH (ref 11.5–15.5)
WBC: 7.8 10*3/uL (ref 4.0–10.5)
nRBC: 0 % (ref 0.0–0.2)

## 2022-08-13 LAB — PROTIME-INR
INR: 1.1 (ref 0.8–1.2)
Prothrombin Time: 14 seconds (ref 11.4–15.2)

## 2022-08-13 LAB — TYPE AND SCREEN
ABO/RH(D): B POS
Antibody Screen: NEGATIVE

## 2022-08-13 LAB — APTT: aPTT: 34 seconds (ref 24–36)

## 2022-08-13 LAB — POCT ACTIVATED CLOTTING TIME
Activated Clotting Time: 269 seconds
Activated Clotting Time: 281 seconds
Activated Clotting Time: 347 seconds

## 2022-08-13 LAB — SURGICAL PCR SCREEN
MRSA, PCR: NEGATIVE
Staphylococcus aureus: NEGATIVE

## 2022-08-13 SURGERY — ENDARTERECTOMY, CAROTID
Anesthesia: General | Site: Neck | Laterality: Left

## 2022-08-13 MED ORDER — SERTRALINE HCL 50 MG PO TABS
50.0000 mg | ORAL_TABLET | Freq: Every day | ORAL | Status: DC
  Administered 2022-08-13 – 2022-08-15 (×3): 50 mg via ORAL
  Filled 2022-08-13 (×3): qty 1

## 2022-08-13 MED ORDER — PROPOFOL 10 MG/ML IV BOLUS
INTRAVENOUS | Status: AC
  Filled 2022-08-13: qty 20

## 2022-08-13 MED ORDER — GABAPENTIN 300 MG PO CAPS
300.0000 mg | ORAL_CAPSULE | Freq: Three times a day (TID) | ORAL | Status: DC
  Administered 2022-08-13 – 2022-08-15 (×5): 300 mg via ORAL
  Filled 2022-08-13 (×5): qty 1

## 2022-08-13 MED ORDER — EPHEDRINE 5 MG/ML INJ
INTRAVENOUS | Status: AC
  Filled 2022-08-13: qty 5

## 2022-08-13 MED ORDER — HYDROMORPHONE HCL 1 MG/ML IJ SOLN
0.5000 mg | INTRAMUSCULAR | Status: DC | PRN
  Administered 2022-08-13: 0.5 mg via INTRAVENOUS
  Filled 2022-08-13: qty 0.5

## 2022-08-13 MED ORDER — CHLORHEXIDINE GLUCONATE 0.12 % MT SOLN
15.0000 mL | Freq: Once | OROMUCOSAL | Status: AC
  Administered 2022-08-13: 15 mL via OROMUCOSAL
  Filled 2022-08-13: qty 15

## 2022-08-13 MED ORDER — FENTANYL CITRATE (PF) 250 MCG/5ML IJ SOLN
INTRAMUSCULAR | Status: AC
  Filled 2022-08-13: qty 5

## 2022-08-13 MED ORDER — HEMOSTATIC AGENTS (NO CHARGE) OPTIME
TOPICAL | Status: DC | PRN
  Administered 2022-08-13: 1 via TOPICAL

## 2022-08-13 MED ORDER — ALBUMIN HUMAN 5 % IV SOLN: INTRAVENOUS | Status: DC | PRN

## 2022-08-13 MED ORDER — PHENYLEPHRINE 80 MCG/ML (10ML) SYRINGE FOR IV PUSH (FOR BLOOD PRESSURE SUPPORT)
PREFILLED_SYRINGE | INTRAVENOUS | Status: DC | PRN
  Administered 2022-08-13: 80 ug via INTRAVENOUS
  Administered 2022-08-13: 120 ug via INTRAVENOUS
  Administered 2022-08-13: 80 ug via INTRAVENOUS
  Administered 2022-08-13 (×2): 120 ug via INTRAVENOUS

## 2022-08-13 MED ORDER — FENTANYL CITRATE (PF) 100 MCG/2ML IJ SOLN
INTRAMUSCULAR | Status: AC
  Administered 2022-08-13: 50 ug via INTRAVENOUS
  Filled 2022-08-13: qty 2

## 2022-08-13 MED ORDER — ORAL CARE MOUTH RINSE: 15.0000 mL | Freq: Once | OROMUCOSAL | Status: AC

## 2022-08-13 MED ORDER — EPHEDRINE SULFATE (PRESSORS) 50 MG/ML IJ SOLN
INTRAMUSCULAR | Status: DC | PRN
  Administered 2022-08-13 (×2): 5 mg via INTRAVENOUS

## 2022-08-13 MED ORDER — ONDANSETRON HCL 4 MG/2ML IJ SOLN: 4.0000 mg | Freq: Four times a day (QID) | INTRAMUSCULAR | Status: DC | PRN

## 2022-08-13 MED ORDER — PROTAMINE SULFATE 10 MG/ML IV SOLN
INTRAVENOUS | Status: AC
  Filled 2022-08-13: qty 5

## 2022-08-13 MED ORDER — OXYCODONE HCL 5 MG PO TABS: 5.0000 mg | ORAL_TABLET | Freq: Once | ORAL | Status: DC | PRN

## 2022-08-13 MED ORDER — INSULIN ASPART 100 UNIT/ML IJ SOLN
0.0000 [IU] | Freq: Three times a day (TID) | INTRAMUSCULAR | Status: DC
  Administered 2022-08-13: 3 [IU] via SUBCUTANEOUS
  Administered 2022-08-14 (×3): 5 [IU] via SUBCUTANEOUS
  Administered 2022-08-15 (×2): 3 [IU] via SUBCUTANEOUS

## 2022-08-13 MED ORDER — POTASSIUM CHLORIDE CRYS ER 20 MEQ PO TBCR: 20.0000 meq | EXTENDED_RELEASE_TABLET | Freq: Every day | ORAL | Status: DC | PRN

## 2022-08-13 MED ORDER — METOPROLOL TARTRATE 5 MG/5ML IV SOLN: 2.0000 mg | INTRAVENOUS | Status: DC | PRN

## 2022-08-13 MED ORDER — ASPIRIN 81 MG PO TBEC
81.0000 mg | DELAYED_RELEASE_TABLET | Freq: Every day | ORAL | Status: DC
  Administered 2022-08-14 – 2022-08-15 (×2): 81 mg via ORAL
  Filled 2022-08-13 (×2): qty 1

## 2022-08-13 MED ORDER — CEFAZOLIN SODIUM-DEXTROSE 2-4 GM/100ML-% IV SOLN
2.0000 g | INTRAVENOUS | Status: AC
  Administered 2022-08-13 (×2): 2 g via INTRAVENOUS
  Filled 2022-08-13: qty 100

## 2022-08-13 MED ORDER — HEPARIN SODIUM (PORCINE) 1000 UNIT/ML IJ SOLN
INTRAMUSCULAR | Status: DC | PRN
  Administered 2022-08-13: 2000 [IU] via INTRAVENOUS
  Administered 2022-08-13: 9000 [IU] via INTRAVENOUS

## 2022-08-13 MED ORDER — OXYCODONE-ACETAMINOPHEN 5-325 MG PO TABS
1.0000 | ORAL_TABLET | ORAL | Status: DC | PRN
  Administered 2022-08-13 – 2022-08-14 (×2): 2 via ORAL
  Administered 2022-08-14 (×2): 1 via ORAL
  Filled 2022-08-13: qty 2
  Filled 2022-08-13: qty 1
  Filled 2022-08-13: qty 2
  Filled 2022-08-13: qty 1
  Filled 2022-08-13: qty 2

## 2022-08-13 MED ORDER — SENNOSIDES-DOCUSATE SODIUM 8.6-50 MG PO TABS: 1.0000 | ORAL_TABLET | Freq: Every evening | ORAL | Status: DC | PRN

## 2022-08-13 MED ORDER — HEPARIN 6000 UNIT IRRIGATION SOLUTION
Status: DC | PRN
  Administered 2022-08-13: 1

## 2022-08-13 MED ORDER — VALACYCLOVIR HCL 500 MG PO TABS
500.0000 mg | ORAL_TABLET | Freq: Every morning | ORAL | Status: DC
  Administered 2022-08-14 – 2022-08-15 (×2): 500 mg via ORAL
  Filled 2022-08-13 (×2): qty 1

## 2022-08-13 MED ORDER — DOCUSATE SODIUM 100 MG PO CAPS
100.0000 mg | ORAL_CAPSULE | Freq: Every day | ORAL | Status: DC
  Administered 2022-08-14 – 2022-08-15 (×2): 100 mg via ORAL
  Filled 2022-08-13 (×2): qty 1

## 2022-08-13 MED ORDER — AMISULPRIDE (ANTIEMETIC) 5 MG/2ML IV SOLN: 10.0000 mg | Freq: Once | INTRAVENOUS | Status: DC | PRN

## 2022-08-13 MED ORDER — CEFAZOLIN SODIUM-DEXTROSE 2-4 GM/100ML-% IV SOLN
INTRAVENOUS | Status: AC
  Filled 2022-08-13: qty 100

## 2022-08-13 MED ORDER — ALUM & MAG HYDROXIDE-SIMETH 200-200-20 MG/5ML PO SUSP: 15.0000 mL | ORAL | Status: DC | PRN

## 2022-08-13 MED ORDER — PHENOL 1.4 % MT LIQD: 1.0000 | OROMUCOSAL | Status: DC | PRN

## 2022-08-13 MED ORDER — ALBUMIN HUMAN 5 % IV SOLN
INTRAVENOUS | Status: AC
  Administered 2022-08-13: 12.5 g
  Filled 2022-08-13: qty 250

## 2022-08-13 MED ORDER — ACETAMINOPHEN 10 MG/ML IV SOLN: 1000.0000 mg | Freq: Once | INTRAVENOUS | Status: DC | PRN

## 2022-08-13 MED ORDER — VASOPRESSIN 20 UNIT/ML IV SOLN
INTRAVENOUS | Status: AC
  Filled 2022-08-13: qty 1

## 2022-08-13 MED ORDER — DEXAMETHASONE SODIUM PHOSPHATE 10 MG/ML IJ SOLN
INTRAMUSCULAR | Status: AC
  Filled 2022-08-13: qty 1

## 2022-08-13 MED ORDER — HEPARIN 6000 UNIT IRRIGATION SOLUTION
Status: AC
  Filled 2022-08-13: qty 500

## 2022-08-13 MED ORDER — MIDAZOLAM HCL 2 MG/2ML IJ SOLN
INTRAMUSCULAR | Status: AC
  Filled 2022-08-13: qty 2

## 2022-08-13 MED ORDER — CEFAZOLIN SODIUM-DEXTROSE 2-4 GM/100ML-% IV SOLN
2.0000 g | Freq: Three times a day (TID) | INTRAVENOUS | Status: AC
  Administered 2022-08-13 – 2022-08-14 (×2): 2 g via INTRAVENOUS
  Filled 2022-08-13 (×2): qty 100

## 2022-08-13 MED ORDER — ROCURONIUM BROMIDE 10 MG/ML (PF) SYRINGE
PREFILLED_SYRINGE | INTRAVENOUS | Status: AC
  Filled 2022-08-13: qty 10

## 2022-08-13 MED ORDER — LIDOCAINE 2% (20 MG/ML) 5 ML SYRINGE
INTRAMUSCULAR | Status: AC
  Filled 2022-08-13: qty 5

## 2022-08-13 MED ORDER — ATROPINE SULFATE 0.4 MG/ML IV SOLN
INTRAVENOUS | Status: AC
  Filled 2022-08-13: qty 1

## 2022-08-13 MED ORDER — LIDOCAINE HCL (PF) 1 % IJ SOLN
INTRAMUSCULAR | Status: AC
  Filled 2022-08-13: qty 30

## 2022-08-13 MED ORDER — FENTANYL CITRATE (PF) 250 MCG/5ML IJ SOLN
INTRAMUSCULAR | Status: DC | PRN
  Administered 2022-08-13: 50 ug via INTRAVENOUS
  Administered 2022-08-13: 150 ug via INTRAVENOUS
  Administered 2022-08-13: 100 ug via INTRAVENOUS

## 2022-08-13 MED ORDER — ONDANSETRON HCL 4 MG/2ML IJ SOLN
INTRAMUSCULAR | Status: DC | PRN
  Administered 2022-08-13: 4 mg via INTRAVENOUS

## 2022-08-13 MED ORDER — FENTANYL CITRATE (PF) 100 MCG/2ML IJ SOLN: 25.0000 ug | INTRAMUSCULAR | Status: DC | PRN

## 2022-08-13 MED ORDER — PROMETHAZINE HCL 25 MG/ML IJ SOLN: 6.2500 mg | INTRAMUSCULAR | Status: DC | PRN

## 2022-08-13 MED ORDER — SODIUM CHLORIDE 0.9 % IV SOLN: INTRAVENOUS | Status: DC

## 2022-08-13 MED ORDER — PHENYLEPHRINE HCL-NACL 20-0.9 MG/250ML-% IV SOLN
INTRAVENOUS | Status: DC | PRN
  Administered 2022-08-13: 50 ug/min via INTRAVENOUS

## 2022-08-13 MED ORDER — FLEET ENEMA 7-19 GM/118ML RE ENEM: 1.0000 | ENEMA | Freq: Once | RECTAL | Status: DC | PRN

## 2022-08-13 MED ORDER — CHLORHEXIDINE GLUCONATE CLOTH 2 % EX PADS: 6.0000 | MEDICATED_PAD | Freq: Once | CUTANEOUS | Status: DC

## 2022-08-13 MED ORDER — ACETAMINOPHEN 325 MG PO TABS: 325.0000 mg | ORAL_TABLET | ORAL | Status: DC | PRN

## 2022-08-13 MED ORDER — ACETAMINOPHEN 325 MG PO TABS
325.0000 mg | ORAL_TABLET | ORAL | Status: DC | PRN
  Administered 2022-08-14 – 2022-08-15 (×4): 650 mg via ORAL
  Filled 2022-08-13 (×4): qty 2

## 2022-08-13 MED ORDER — EMPAGLIFLOZIN-METFORMIN HCL ER 25-1000 MG PO TB24: ORAL_TABLET | Freq: Every day | ORAL | Status: DC

## 2022-08-13 MED ORDER — GUAIFENESIN-DM 100-10 MG/5ML PO SYRP: 15.0000 mL | ORAL_SOLUTION | ORAL | Status: DC | PRN

## 2022-08-13 MED ORDER — CEFAZOLIN SODIUM-DEXTROSE 2-4 GM/100ML-% IV SOLN: 2.0000 g | Freq: Three times a day (TID) | INTRAVENOUS | Status: DC

## 2022-08-13 MED ORDER — OXYCODONE HCL 5 MG/5ML PO SOLN: 5.0000 mg | Freq: Once | ORAL | Status: DC | PRN

## 2022-08-13 MED ORDER — ACETAMINOPHEN 160 MG/5ML PO SOLN: 325.0000 mg | ORAL | Status: DC | PRN

## 2022-08-13 MED ORDER — LIDOCAINE 2% (20 MG/ML) 5 ML SYRINGE
INTRAMUSCULAR | Status: DC | PRN
  Administered 2022-08-13: 30 mg via INTRAVENOUS

## 2022-08-13 MED ORDER — HEPARIN SODIUM (PORCINE) 1000 UNIT/ML IJ SOLN
INTRAMUSCULAR | Status: AC
  Filled 2022-08-13: qty 10

## 2022-08-13 MED ORDER — ACETAMINOPHEN 650 MG RE SUPP: 325.0000 mg | RECTAL | Status: DC | PRN

## 2022-08-13 MED ORDER — ROCURONIUM BROMIDE 10 MG/ML (PF) SYRINGE
PREFILLED_SYRINGE | INTRAVENOUS | Status: DC | PRN
  Administered 2022-08-13: 100 mg via INTRAVENOUS
  Administered 2022-08-13 (×2): 20 mg via INTRAVENOUS

## 2022-08-13 MED ORDER — EMPAGLIFLOZIN 25 MG PO TABS
25.0000 mg | ORAL_TABLET | Freq: Every day | ORAL | Status: DC
  Administered 2022-08-14 – 2022-08-15 (×2): 25 mg via ORAL
  Filled 2022-08-13 (×2): qty 1

## 2022-08-13 MED ORDER — HYDRALAZINE HCL 20 MG/ML IJ SOLN: 5.0000 mg | INTRAMUSCULAR | Status: DC | PRN

## 2022-08-13 MED ORDER — PROTAMINE SULFATE 10 MG/ML IV SOLN
INTRAVENOUS | Status: DC | PRN
  Administered 2022-08-13 (×5): 10 mg via INTRAVENOUS

## 2022-08-13 MED ORDER — MAGNESIUM SULFATE 2 GM/50ML IV SOLN: 2.0000 g | Freq: Every day | INTRAVENOUS | Status: DC | PRN

## 2022-08-13 MED ORDER — BISACODYL 5 MG PO TBEC: 5.0000 mg | DELAYED_RELEASE_TABLET | Freq: Every day | ORAL | Status: DC | PRN

## 2022-08-13 MED ORDER — METFORMIN HCL ER 500 MG PO TB24
1000.0000 mg | ORAL_TABLET | Freq: Every day | ORAL | Status: DC
  Administered 2022-08-14 – 2022-08-15 (×2): 1000 mg via ORAL
  Filled 2022-08-13 (×2): qty 2

## 2022-08-13 MED ORDER — PHENYLEPHRINE 80 MCG/ML (10ML) SYRINGE FOR IV PUSH (FOR BLOOD PRESSURE SUPPORT)
PREFILLED_SYRINGE | INTRAVENOUS | Status: AC
  Filled 2022-08-13: qty 10

## 2022-08-13 MED ORDER — SODIUM CHLORIDE 0.9 % IV SOLN: 500.0000 mL | Freq: Once | INTRAVENOUS | Status: DC | PRN

## 2022-08-13 MED ORDER — ONDANSETRON HCL 4 MG/2ML IJ SOLN
INTRAMUSCULAR | Status: AC
  Filled 2022-08-13: qty 2

## 2022-08-13 MED ORDER — 0.9 % SODIUM CHLORIDE (POUR BTL) OPTIME
TOPICAL | Status: DC | PRN
  Administered 2022-08-13: 1000 mL

## 2022-08-13 MED ORDER — SUGAMMADEX SODIUM 200 MG/2ML IV SOLN
INTRAVENOUS | Status: DC | PRN
  Administered 2022-08-13: 300 mg via INTRAVENOUS

## 2022-08-13 MED ORDER — LACTATED RINGERS IV SOLN: INTRAVENOUS | Status: DC

## 2022-08-13 MED ORDER — VASOPRESSIN 20 UNIT/ML IV SOLN
INTRAVENOUS | Status: DC | PRN
  Administered 2022-08-13 (×2): 1 [IU] via INTRAVENOUS

## 2022-08-13 MED ORDER — PANTOPRAZOLE SODIUM 20 MG PO TBEC
20.0000 mg | DELAYED_RELEASE_TABLET | Freq: Every day | ORAL | Status: DC
  Administered 2022-08-13 – 2022-08-15 (×3): 20 mg via ORAL
  Filled 2022-08-13 (×3): qty 1

## 2022-08-13 MED ORDER — ATORVASTATIN CALCIUM 80 MG PO TABS
80.0000 mg | ORAL_TABLET | Freq: Every day | ORAL | Status: DC
  Administered 2022-08-13 – 2022-08-15 (×3): 80 mg via ORAL
  Filled 2022-08-13 (×3): qty 1

## 2022-08-13 MED ORDER — PROPOFOL 10 MG/ML IV BOLUS
INTRAVENOUS | Status: DC | PRN
  Administered 2022-08-13: 100 mg via INTRAVENOUS

## 2022-08-13 MED ORDER — LABETALOL HCL 5 MG/ML IV SOLN: 10.0000 mg | INTRAVENOUS | Status: DC | PRN

## 2022-08-13 MED ORDER — MIDAZOLAM HCL 2 MG/2ML IJ SOLN
INTRAMUSCULAR | Status: DC | PRN
  Administered 2022-08-13: 2 mg via INTRAVENOUS

## 2022-08-13 MED ORDER — AMLODIPINE BESYLATE 5 MG PO TABS
5.0000 mg | ORAL_TABLET | Freq: Every day | ORAL | Status: DC
  Administered 2022-08-13 – 2022-08-15 (×3): 5 mg via ORAL
  Filled 2022-08-13 (×3): qty 1

## 2022-08-13 MED ORDER — HEPARIN SODIUM (PORCINE) 5000 UNIT/ML IJ SOLN
5000.0000 [IU] | Freq: Three times a day (TID) | INTRAMUSCULAR | Status: DC
  Administered 2022-08-14 – 2022-08-15 (×5): 5000 [IU] via SUBCUTANEOUS
  Filled 2022-08-13 (×5): qty 1

## 2022-08-13 MED ORDER — HEMOSTATIC AGENTS (NO CHARGE) OPTIME
TOPICAL | Status: DC | PRN
  Administered 2022-08-13: 2 via TOPICAL

## 2022-08-13 SURGICAL SUPPLY — 62 items
ADH SKN CLS APL DERMABOND .7 (GAUZE/BANDAGES/DRESSINGS) ×2
ADH SKN CLS LQ APL DERMABOND (GAUZE/BANDAGES/DRESSINGS) ×1
BAG COUNTER SPONGE SURGICOUNT (BAG) ×2 IMPLANT
BAG SPNG CNTER NS LX DISP (BAG) ×1
CANISTER SUCT 3000ML PPV (MISCELLANEOUS) ×2 IMPLANT
CATH ROBINSON RED A/P 18FR (CATHETERS) ×2 IMPLANT
CLIP VESOCCLUDE MED 24/CT (CLIP) ×2 IMPLANT
CLIP VESOCCLUDE SM WIDE 24/CT (CLIP) ×2 IMPLANT
COVER PROBE W GEL 5X96 (DRAPES) IMPLANT
COVER TRANSDUCER ULTRASND GEL (DISPOSABLE) ×2 IMPLANT
DERMABOND ADVANCED .7 DNX12 (GAUZE/BANDAGES/DRESSINGS) ×2 IMPLANT
DERMABOND ADVANCED .7 DNX6 (GAUZE/BANDAGES/DRESSINGS) IMPLANT
DRAPE IMP U-DRAPE 54X76 (DRAPES) IMPLANT
DRAPE INCISE IOBAN 66X45 STRL (DRAPES) IMPLANT
DRAPE ORTHO SPLIT 77X108 STRL (DRAPES) ×1
DRAPE SURG ORHT 6 SPLT 77X108 (DRAPES) IMPLANT
ELECT REM PT RETURN 9FT ADLT (ELECTROSURGICAL) ×1
ELECTRODE REM PT RTRN 9FT ADLT (ELECTROSURGICAL) ×2 IMPLANT
GLOVE BIO SURGEON STRL SZ 6.5 (GLOVE) IMPLANT
GLOVE BIO SURGEON STRL SZ7 (GLOVE) IMPLANT
GLOVE BIO SURGEON STRL SZ7.5 (GLOVE) ×2 IMPLANT
GLOVE BIOGEL PI IND STRL 7.0 (GLOVE) IMPLANT
GLOVE BIOGEL PI IND STRL 8 (GLOVE) ×2 IMPLANT
GOWN STRL REUS W/ TWL LRG LVL3 (GOWN DISPOSABLE) ×6 IMPLANT
GOWN STRL REUS W/ TWL XL LVL3 (GOWN DISPOSABLE) ×4 IMPLANT
GOWN STRL REUS W/TWL LRG LVL3 (GOWN DISPOSABLE) ×3
GOWN STRL REUS W/TWL XL LVL3 (GOWN DISPOSABLE) ×2
GRAFT PROPATEN W/RING 6X80X60 (Vascular Products) IMPLANT
HEMOSTAT SNOW SURGICEL 2X4 (HEMOSTASIS) IMPLANT
INSERT FOGARTY SM (MISCELLANEOUS) ×4 IMPLANT
KIT BASIN OR (CUSTOM PROCEDURE TRAY) ×2 IMPLANT
KIT SHUNT ARGYLE CAROTID ART 6 (VASCULAR PRODUCTS) IMPLANT
KIT TURNOVER KIT B (KITS) ×2 IMPLANT
LOOP VESSEL MINI RED (MISCELLANEOUS) IMPLANT
MARKER SKIN DUAL TIP RULER LAB (MISCELLANEOUS) IMPLANT
NDL HYPO 25GX1X1/2 BEV (NEEDLE) IMPLANT
NEEDLE HYPO 25GX1X1/2 BEV (NEEDLE) ×1 IMPLANT
NS IRRIG 1000ML POUR BTL (IV SOLUTION) ×6 IMPLANT
PACK CAROTID (CUSTOM PROCEDURE TRAY) ×2 IMPLANT
PAD ARMBOARD 7.5X6 YLW CONV (MISCELLANEOUS) ×4 IMPLANT
PATCH VASC XENOSURE 1CMX6CM (Vascular Products) ×1 IMPLANT
PATCH VASC XENOSURE 1X6 (Vascular Products) IMPLANT
PENCIL BUTTON HOLSTER BLD 10FT (ELECTRODE) IMPLANT
POSITIONER HEAD DONUT 9IN (MISCELLANEOUS) ×2 IMPLANT
POWDER SURGICEL 3.0 GRAM (HEMOSTASIS) IMPLANT
SHEATH PROBE COVER 6X72 (BAG) IMPLANT
SPONGE T-LAP 18X18 ~~LOC~~+RFID (SPONGE) IMPLANT
STOCKINETTE 6  STRL (DRAPES) ×1
STOCKINETTE 6 STRL (DRAPES) IMPLANT
SUT MNCRL AB 4-0 PS2 18 (SUTURE) ×2 IMPLANT
SUT PROLENE 5 0 C 1 24 (SUTURE) ×2 IMPLANT
SUT PROLENE 6 0 BV (SUTURE) ×2 IMPLANT
SUT PROLENE 6 0 CC (SUTURE) ×2 IMPLANT
SUT PROLENE 7 0 BV 1 (SUTURE) IMPLANT
SUT VIC AB 2-0 CT1 27 (SUTURE) ×1
SUT VIC AB 2-0 CT1 TAPERPNT 27 (SUTURE) ×2 IMPLANT
SUT VIC AB 3-0 SH 27 (SUTURE) ×4
SUT VIC AB 3-0 SH 27X BRD (SUTURE) ×2 IMPLANT
SUT VIC AB 3-0 SH 27XBRD (SUTURE) ×2 IMPLANT
SYR CONTROL 10ML LL (SYRINGE) IMPLANT
TOWEL GREEN STERILE (TOWEL DISPOSABLE) ×2 IMPLANT
WATER STERILE IRR 1000ML POUR (IV SOLUTION) ×2 IMPLANT

## 2022-08-13 NOTE — Anesthesia Postprocedure Evaluation (Signed)
Anesthesia Post Note  Patient: Meagan [name]  Procedure(s) Performed: ENDARTERECTOMY CAROTID WITH BOVINE PATCH. (Left: Neck) REDO LEFT COMMON CAROTID-BRACHIAL ARTERY BYPASS USING PROPATEN GRAFT. (Left: Neck)     Patient location during evaluation: PACU Anesthesia Type: General Level of consciousness: awake and alert Pain management: pain level controlled Vital Signs Assessment: post-procedure vital signs reviewed and stable Respiratory status: spontaneous breathing, nonlabored ventilation, respiratory function stable and patient connected to nasal cannula oxygen Cardiovascular status: blood pressure returned to baseline and stable Postop Assessment: no apparent nausea or vomiting Anesthetic complications: no   No notable events documented.  Last Vitals:  Vitals:   08/13/22 1617 08/13/22 1630  BP: (!) 130/42 (!) 133/38  Pulse: (!) 104 100  Resp: 12 10  Temp:    SpO2: 99% 98%    Last Pain:  Vitals:   08/13/22 1617  TempSrc:   PainSc: Asleep    LLE Motor Response: Purposeful movement (08/13/22 1648)   RLE Motor Response: Purposeful movement (08/13/22 1648)        Effie Berkshire

## 2022-08-13 NOTE — Anesthesia Procedure Notes (Signed)
Procedure Name: Intubation Date/Time: 08/13/2022 8:56 AM  Performed by: Eligha Bridegroom, CRNAPre-anesthesia Checklist: Patient identified, Suction available, Emergency Drugs available, Patient being monitored and Timeout performed Patient Re-evaluated:Patient Re-evaluated prior to induction Oxygen Delivery Method: Circle system utilized Preoxygenation: Pre-oxygenation with 100% oxygen Induction Type: IV induction Grade View: Grade III Tube type: Oral Tube size: 7.0 mm Number of attempts: 2 Placement Confirmation: ETT inserted through vocal cords under direct vision, positive ETCO2 and breath sounds checked- equal and bilateral Secured at: 21 cm Tube secured with: Tape Dental Injury: Teeth and Oropharynx as per pre-operative assessment

## 2022-08-13 NOTE — Transfer of Care (Signed)
Immediate Anesthesia Transfer of Care Note  Patient: Meagan [name]  Procedure(s) Performed: ENDARTERECTOMY CAROTID WITH BOVINE PATCH. (Left: Neck) REDO LEFT COMMON CAROTID-BRACHIAL ARTERY BYPASS USING PROPATEN GRAFT. (Left: Neck)  Patient Location: PACU  Anesthesia Type:General  Level of Consciousness: awake, alert  and oriented  Airway & Oxygen Therapy: Patient Spontanous Breathing and Patient connected to nasal cannula oxygen  Post-op Assessment: Report given to RN and Post -op Vital signs reviewed and stable  Post vital signs: Reviewed and stable  Last Vitals:  Vitals Value Taken Time  BP 104/44 08/13/22 1315  Temp    Pulse 106 08/13/22 1317  Resp 24 08/13/22 1317  SpO2 97 % 08/13/22 1317  Vitals shown include unvalidated device data.  Last Pain:  Vitals:   08/13/22 0702  TempSrc: Oral  PainSc: 8       Patients Stated Pain Goal: 2 (45/80/99 8338)  Complications: No notable events documented.

## 2022-08-13 NOTE — Plan of Care (Signed)
  Problem: Education: Goal: Understanding of CV disease, CV risk reduction, and recovery process will improve Outcome: Progressing   Problem: Activity: Goal: Ability to return to baseline activity level will improve Outcome: Progressing   Problem: Cardiovascular: Goal: Ability to achieve and maintain adequate cardiovascular perfusion will improve Outcome: Progressing Goal: Vascular access site(s) Level 0-1 will be maintained Outcome: Progressing   

## 2022-08-13 NOTE — Progress Notes (Signed)
Pt arrived from PACU, NIH score 0, left neck incision level 0 clarn dry and intact. CCMD called, pt oriented to unit.   08/13/22 1544  Vitals  Temp 98.3 F (36.8 C)  Temp Source Oral  BP 124/70  MAP (mmHg) 84  BP Location Right Leg  BP Method Automatic  Patient Position (if appropriate) Lying  Pulse Rate (!) 101  Pulse Rate Source Monitor  ECG Heart Rate (!) 101  Resp 12  Level of Consciousness  Level of Consciousness Alert  MEWS COLOR  MEWS Score Color Yellow  Oxygen Therapy  SpO2 98 %  O2 Device Nasal Cannula  O2 Flow Rate (L/min) 2 L/min  Pain Assessment  Pain Scale 0-10  Pain Score 4  Pain Type Surgical pain  Height and Weight  Height '5\' 3"'$  (1.6 m)  Weight 89.8 kg  Type of Weight Stated  BSA (Calculated - sq m) 2 sq meters  BMI (Calculated) 35.08  Weight in (lb) to have BMI = 25 140.8  Glasgow Coma Scale  Eye Opening 4  Best Verbal Response (NON-intubated) 5  Best Motor Response 6  Glasgow Coma Scale Score 15  MEWS Score  MEWS Temp 0  MEWS Systolic 0  MEWS Pulse 1  MEWS RR 1  MEWS LOC 0  MEWS Score 2

## 2022-08-13 NOTE — Op Note (Signed)
OPERATIVE NOTE  PROCEDURE:   1.  Left carotid endarterectomy with bovine patch angioplasty 2.  Left intraoperative carotid ultrasound 3.  Re-do left common carotid artery to brachial artery bypass using 6 mm ringed PTFE  PRE-OPERATIVE DIAGNOSIS:  1.  Critical limb ischemia of the left upper extremity with tissue loss (dry gangrene of left digits 2 and 3) 2.  Asymptomatic high-grade calcified left internal carotid artery stenosis >80%  POST-OPERATIVE DIAGNOSIS: same as above   SURGEON: Marty Heck, MD  ASSISTANT(S): Paulo Fruit, [place]  ANESTHESIA: general  ESTIMATED BLOOD LOSS: <200 mL  FINDING(S): Left carotid endarterectomy was performed for a high-grade calcified asymptomatic stenosis.  Bovine pericardial patch was sewn from the common carotid artery onto the internal carotid artery.  I tunneled a 6 mm ringed PTFE graft from the brachial artery just above the elbow to the common carotid artery through a subcutaneous tunnel between the deltopectoral groove and over the clavicle and under the left sternocleidomastoid muscle and internal jugular vein.  The graft was sewn end to side to the common carotid artery patch and sewn end to side to the brachial artery just above the elbow.  Palpable radial pulse at completion.  Woke up neurologically intact.  SPECIMEN(S):  Carotid plaque (sent to Pathology)  INDICATIONS:   Meagan [name] is a 61 y.o. female who presents with gangrene of the left hand on the tips of her digits with an occluded left common carotid to brachial artery bypass performed with vein tunneled anatomic last year.  Her finger wounds ultimately healed in the past and then her bypass occluded and then she developed new tissue loss in the left hand.  She presents today for left carotid endarterectomy for high-grade asymptomatic stenosis and then a redo left common carotid artery to brachial artery bypass.  Risk benefits discussed.  An assistant was needed given  the complexity of the case and for doing the endarterectomy with patch angioplasty and also sewing both anastomosis of the upper extremity bypass.  DESCRIPTION: After full informed written consent was obtained from the patient, the patient was brought back to the operating room and placed supine upon the operating table.  Prior to induction, the patient received IV antibiotics.  After obtaining adequate anesthesia, the patient was placed into semi-Fowler position with a shoulder roll in place and the patient's neck slightly hyperextended and rotated away from the surgical site.  The patient's left arm and left hand, left chest, and left neck were all prepped and draped in standard sterile fashion.   Timeout was performed.    Initially started with a longitudinal incision over the brachial artery just above the left elbow and dissected through the subcutaneous tissue with Bovie cautery and used wheatlander retractors for added visualization.  The brachial artery was dissected out and controlled with Vesseloops proximally and distally.  This was soft but it was small only measuring about 3 mm.  I then turned my attention to the left neck.  I made an incision anterior to the sternocleidomastoid muscle and dissected down through the subcutaneous tissue.  The platysma was opened with electrocautery.  I then used Bovie cautery and blunt dissection to dissect through the underlying platysma and to mobilize the anterior border of the sternocleidomastoid as well as the internal jugular vein laterally.  The facial vein was ligated with 3-0 silk and divided.    Prior to giving heparin, I used a large Gore tunneler and tunneled in the subcutaneous space from the  brachial artery exposure above the elbow up through the deltopectoral groove and over the clavicle to the common carotid in the neck and I made sure to tunnel under the sternocleidomastoid to ensure that we had muscle coverage over the carotid endarterectomy  site.  We passed a 6 mm ringed PTFE graft through the field and had good one-to-one tension and the tunneler was removed.  I then gave the patient 100 units/kg IV heparin and ACT was checked to maintain greater than 250.  After identifying the carotid artery I used Metzenbaum scissors to bluntly dissect the common carotid artery and then controlled this with a umbilical tape. I then carried my dissection cephalad and mobilized the external carotid artery and superior thyroid artery and controlled each of these with a vessel loop.  I then dissected out the internal carotid artery well past the distal plaque.  The internal carotid artery was then controlled with a umbilical tape as well. I was careful to identify the vagus nerve between the internal jugular and common carotid and this was presereved.  I was also careful to identify and preserve the hypoglossal nerve and this was preserved.    Once our ACT was confirmed, I proceeded by clamping the internal carotid artery with a angled bulldog clamp first.  The proximal common carotid artery was controlled with a angled debakey clamp.  The external carotid was controlled with a vessel loop.  I subsequently opened the common carotid artery with an 11 blade scalpel in longitudinal fashion and extended the arteriotomy with Potts scissors onto the ICA past the distal plaque.  I elected not to place a shunt given pulsatile backbleeding from the internal carotid artery.  I then used a Garment/textile technologist and performed a endarterectomy starting in the common carotid artery.  The external carotid artery was endarterectomized with an eversion technique and I was careful to feather the distal ICA plaque.  The specimen was passed off the field.  The endarterectomy site was then flushed with heparinized saline and I was careful to ensure there were no flaps in the endarterectomy site.  The distal plaque in the ICA was tacked down with multiple 7-0 Prolene sutures.  I then brought  a bovine carotid patch on the field and this was sewn in place with a running anastomosis using a 6-0 Prolene distally and a 5-0 proximal.  The bovine patch was trimmed accordingly.  The artery was flushed antegrade and retrograde prior to completion of the patch.  Once the patch was complete, I flushed up the external carotid artery first prior to releasing the internal carotid artery clamp.  An intraoperative duplex was performed that showed no evidence of any flaps.  I listened with pencil doppler and had excellent flow in the ICA and ECA.  At this point time I reclamped the carotid artery proximal and distal to the patch.  The patch on the common carotid artery was then opened with 11 blade scalpel and Potts scissors.  I then spatulated the PTFE graft that had already been tunneled and an end to side anastomosis was sewn to the left common carotid artery onto the patch with 6-0 Prolene parachute technique.  I did de-air the graft prior to completion and backbled the internal carotid flushing up the external carotid first.  We had pulsatile flow in the graft with good hemostasis in the wound.  We then ensured we had good one-to-one tension.  We then turned our attention to the left arm where the brachial  artery was controlled with bulldog clamps.  I opened the brachial artery with 11 blade scalpel and Potts scissors.  The artery was only about 3 mm and quite small.  The graft was then spatulated and end to side anastomosis was sewn to the left brachial artery using 6-0 Prolene parachute technique and this was de-aired prior to completion.  Once we came off clamps there was a palpable pulse in the radial artery in the left arm.  We listen again with Doppler had excellent flow in the external and internal carotid artery.  Protamine was given for reversal.  The arm was then washed out and closed with 3-0 Vicryl, 4-0 Monocryl and Dermabond.  In the neck we got hemostasis with Surgicel snow and then closed this in  multiple layers of 3-0 Vicryl, 4-0 Monocryl, and Dermabond.  She was awakened neurologically intact taken to PACU in stable condition.    COMPLICATIONS: None  CONDITION: Stable  Marty Heck, MD [facility] Office: [place]   08/13/2022, 1:08 PM

## 2022-08-13 NOTE — Anesthesia Procedure Notes (Signed)
Arterial Line Insertion Start/End9/13/2023 8:00 AM, 08/13/2022 8:05 AM Performed by: Effie Berkshire, MD  Patient location: Pre-op. Preanesthetic checklist: patient identified, IV checked, site marked, risks and benefits discussed, surgical consent, monitors and equipment checked, pre-op evaluation, timeout performed and anesthesia consent Lidocaine 1% used for infiltration Right, radial was placed Catheter size: 20 G Hand hygiene performed  and maximum sterile barriers used   Attempts: 1 Procedure performed without using ultrasound guided technique. Following insertion, dressing applied and Biopatch. Post procedure assessment: normal and unchanged  Patient tolerated the procedure well with no immediate complications.

## 2022-08-13 NOTE — Anesthesia Preprocedure Evaluation (Addendum)
Anesthesia Evaluation  Patient identified by MRN, date of birth, ID band Patient awake    Reviewed: Allergy & Precautions, NPO status , Patient's Chart, lab work & pertinent test results  History of Anesthesia Complications (+) PONV and history of anesthetic complications  Airway Mallampati: II  TM Distance: >3 FB Neck ROM: Full    Dental  (+) Poor Dentition, Missing, Chipped, Dental Advisory Given   Pulmonary    breath sounds clear to auscultation       Cardiovascular hypertension, Pt. on medications + Peripheral Vascular Disease   Rhythm:Regular Rate:Normal     Neuro/Psych negative neurological ROS  negative psych ROS   GI/Hepatic Neg liver ROS, GERD  Medicated,  Endo/Other  diabetes, Type 2, Insulin Dependent, Oral Hypoglycemic Agents  Renal/GU Renal disease     Musculoskeletal  (+) Arthritis ,   Abdominal Normal abdominal exam  (+)   Peds  Hematology   Anesthesia Other Findings   Reproductive/Obstetrics                            Anesthesia Physical Anesthesia Plan  ASA: 3  Anesthesia Plan: General   Post-op Pain Management:    Induction: Intravenous  PONV Risk Score and Plan: 4 or greater and Ondansetron, Dexamethasone, Midazolam and Scopolamine patch - Pre-op  Airway Management Planned: Oral ETT  Additional Equipment: Arterial line  Intra-op Plan:   Post-operative Plan: Extubation in OR  Informed Consent: I have reviewed the patients History and Physical, chart, labs and discussed the procedure including the risks, benefits and alternatives for the proposed anesthesia with the patient or authorized representative who has indicated his/her understanding and acceptance.     Dental advisory given  Plan Discussed with: CRNA  Anesthesia Plan Comments: (- 2 IV's)       Anesthesia Quick Evaluation

## 2022-08-13 NOTE — H&P (Signed)
History and Physical Interval Note:  08/13/2022 8:15 AM  Meagan [name]  has presented today for surgery, with the diagnosis of Left carotid artery stenosis; Occlusion of left common carotid to brachial artery bypass.  The various methods of treatment have been discussed with the patient and family. After consideration of risks, benefits and other options for treatment, the patient has consented to  Procedure(s): ENDARTERECTOMY CAROTID (Left) REDO LEFT COMMON CAROTID-BRACHIAL ARTERY BYPASS (Left) as a surgical intervention.  The patient's history has been reviewed, patient examined, no change in status, stable for surgery.  I have reviewed the patient's chart and labs.  Questions were answered to the patient's satisfaction.    Occluded left carotid to brachial vein bypass.  New tissue loss left hand.  Discussed plan for left carotid endarterectomy for high grade stenosis demonstrated on CTA neck and recent angiogram and re-do left carotid to brachial artery bypass likely with PTFE.  Risk and benefits discussed including stroke risk.    Marty Heck  History of Present Illness:  Patient is a 61 y.o. year old female who presents for evaluation of new non healing left finger tip wounds.  She was first seen by DR. Welcome Fults with left finger tip wounds after a pedicure in Feb 2022.  She underwent aortic arch angiogram and ultimately had Left common carotid to brachial artery bypass with reversed left leg great saphenous vein on 08/12/21 by Dr. Carlis Abbott.  She was seen in our office 09/03/21 and at that time the finger tips were fully healed.             She come in today with reports of new finger tip ulcer after a blood sugar stick to the tip of the finger.  The tip is dry with gangrene.  She denise pain or loss of motor.               She has been exercising and monitoring her blood sugeas regularly.                      Past Medical History:  Diagnosis Date   Arthritis     Chronic kidney  disease     Complication of anesthesia     Diabetes mellitus (HCC)     GERD (gastroesophageal reflux disease)     Hyperlipidemia     Hypertension     PONV (postoperative nausea and vomiting)                 Past Surgical History:  Procedure Laterality Date   AORTIC ARCH ANGIOGRAPHY N/A 08/01/2021    Procedure: AORTIC ARCH ANGIOGRAPHY;  Surgeon: Marty Heck, MD;  Location: [facility];  Service: Cardiovascular;  Laterality: N/A;   CAROTID-SUBCLAVIAN BYPASS GRAFT Left 08/12/2021    Procedure: LEFT CAROTID-BRACHIAL ARTERY BYPASS using Left greater saphenous Vein, harvested from left leg.;  Surgeon: Marty Heck, MD;  Location: [facility] OR;  Service: Vascular;  Laterality: Left;   CARPAL TUNNEL RELEASE Left 01/31/2021   CARPAL TUNNEL RELEASE Right 2021    Dr. Mardelle Matte   DILATION AND CURETTAGE OF UTERUS   2007   ROTATOR CUFF REPAIR Right 2018    Dr. Mardelle Matte   TUBAL LIGATION   1992   UPPER EXTREMITY ANGIOGRAPHY Left 08/01/2021    Procedure: UPPER EXTREMITY ANGIOGRAPHY;  Surgeon: Marty Heck, MD;  Location: [facility];  Service: Cardiovascular;  Laterality: Left;      ROS:    General:  No weight loss, Fever, chills   HEENT: No recent headaches, no nasal bleeding, no visual changes, no sore throat   Neurologic: No dizziness, blackouts, seizures. No recent symptoms of stroke or mini- stroke. No recent episodes of slurred speech, or temporary blindness.   Cardiac: No recent episodes of chest pain/pressure, no shortness of breath at rest.  No shortness of breath with exertion.  Denies history of atrial fibrillation or irregular heartbeat   Vascular: No history of rest pain in feet.  No history of claudication.  No history of non-healing ulcer, No history of DVT    Pulmonary: No home oxygen, no productive cough, no hemoptysis,  No asthma or wheezing   Musculoskeletal:  _0  Arthritis, _1  Low back pain,  _2  Joint pain   Hematologic:No history of  hypercoagulable state.  No history of easy bleeding.  No history of anemia   Gastrointestinal: No hematochezia or melena,  No gastroesophageal reflux, no trouble swallowing   Urinary: _3  chronic Kidney disease, _4  on HD - _5  MWF or _6  TTHS, _7  Burning with urination, _8  Frequent urination, _9  Difficulty urinating;    Skin: No rashes   Psychological: No history of anxiety,  No history of depression   Social History Social History             Tobacco Use   Smoking status: Never   Smokeless tobacco: Never  Vaping Use   Vaping Use: Never used  Substance Use Topics   Alcohol use: Yes      Comment: occasional   Drug use: Never      Family History          Family History  Problem Relation Age of Onset   Kidney failure Mother     Liver disease Mother     Early death Father     Hypertension Brother     Colon cancer Neg Hx     Stomach cancer Neg Hx        Allergies            Allergies  Allergen Reactions   Promethazine Hcl Shortness Of Breath and Other (See Comments)      Bells palsy   Lisinopril Itching and Swelling                   Current Outpatient Medications  Medication Sig Dispense Refill   amLODipine (NORVASC) 5 MG tablet TAKE 1 TABLET BY MOUTH EVERY DAY 90 tablet 1   aspirin EC 81 MG tablet Take 1 tablet (81 mg total) by mouth daily. Swallow whole. 150 tablet 2   atorvastatin (LIPITOR) 80 MG tablet Take 0.5 tablets (40 mg total) by mouth daily. 90 tablet 1   Blood Glucose Monitoring Suppl ([place]) w/Device KIT Use as directed to check blood sugars 2 times per day dx: e11.65 1 kit 1   cephALEXin (KEFLEX) 500 MG capsule Take 1 capsule (500 mg total) by mouth 4 (four) times daily. 20 capsule 0   Cholecalciferol (DIALYVITE VITAMIN D 5000 PO) Take 10,000 Units by mouth in the morning.       diclofenac Sodium (VOLTAREN) 1 % GEL Use as directed, apply to affected joints tid prn 150 g 2   ferrous sulfate 325 (65 FE) MG tablet TAKE 1 TABLET  BY MOUTH EVERY DAY WITH BREAKFAST (Patient taking differently: Take 325 mg by mouth daily with breakfast.) 90 tablet 1   gabapentin (NEURONTIN)  300 MG capsule TAKE 1 CAPSULE BY MOUTH 3 (THREE) TIMES DAILY. TAKE 2 CAPSULES TO EQUAL 600MG AT NIGHT 180 capsule 2   glucose blood (ONETOUCH VERIO) test strip USE TO CHECK BLOOD SUGAR TWICE A DAY 100 strip 12   insulin degludec (TRESIBA FLEXTOUCH) 200 UNIT/ML FlexTouch Pen INJECT 66 UNITS SUBCUTANEOUSLY DAILY 200 mL 1   Insulin Pen Needle (B-D ULTRAFINE III SHORT PEN) 31G X 8 MM MISC Use as directed with insulin pen 100 each 3   JARDIANCE 10 MG TABS tablet TAKE 1 TABLET BY MOUTH EVERY DAY IN THE MORNING 90 tablet 1   MAGNESIUM PO Take 4 tablets by mouth daily.       Multiple Vitamin (MULTIVITAMIN WITH MINERALS) TABS tablet Take 1 tablet by mouth daily.       NOVOLOG FLEXPEN 100 UNIT/ML FlexPen INJECT 12 UNITS 3 TIMES PER DAY WITH MEALS NOT TO EXCEED 50 UNITS 15 mL 1   Omega-3 Fatty Acids (FISH OIL PO) Take 1 capsule by mouth in the morning.       PRILOSEC OTC 20 MG tablet Take 20 mg by mouth daily.        Semaglutide, 2 MG/DOSE, (OZEMPIC, 2 MG/DOSE,) 8 MG/3ML SOPN Inject 2 mg into the skin once a week. 3 mL 0   sertraline (ZOLOFT) 50 MG tablet TAKE 1 TABLET BY MOUTH EVERY DAY 90 tablet 0   valACYclovir (VALTREX) 500 MG tablet TAKE 1 TABLET (500 MG TOTAL) BY MOUTH IN THE MORNING 90 tablet 1    No current facility-administered medications for this visit.      Physical Examination        Vitals:    07/22/22 1236  Resp: 20  Temp: 97.8 F (36.6 C)  TempSrc: Temporal  Weight: 198 lb 8 oz (90 kg)  Height: _0  (1.6 m)      Body mass index is 35.16 kg/m.   General:  Alert and oriented, no acute distress HEENT: Normal Neck: No bruit or JVD Pulmonary: Clear to auscultation bilaterally Cardiac: Regular Rate and Rhythm without murmur Abdomen: Soft, non-tender, non-distended, no mass, no scars Skin: No rash.  Dry gangrene without infection signs  to first and third digits left UE.       Extremity Pulses:   femoral  pulses bilaterally Musculoskeletal: No deformity or edema      Neurologic: Upper and lower extremity motor 5/5 and symmetric   DATA:    Left Doppler Findings:  +-------------+----------+----------+--------+--------+  Site         PSV (cm/s)Waveform  StenosisComments  +-------------+----------+----------+--------+--------+  Brachial Prox27        monophasic                  +-------------+----------+----------+--------+--------+  Brachial Mid 24        monophasic                  +-------------+----------+----------+--------+--------+  Brachial Dist43        monophasic                  +-------------+----------+----------+--------+--------+  Radial Dist  14        monophasic                  +-------------+----------+----------+--------+--------+  Ulnar Dist   19        monophasic                  +-------------+----------+----------+--------+--------+  Left Graft #1: carotid to brachial  +--------------------+--------+--------+--------+--------+                      PSV cm/sStenosisWaveformComments  +--------------------+--------+--------+--------+--------+  Inflow                                                +--------------------+--------+--------+--------+--------+  Proximal Anastomosis                                  +--------------------+--------+--------+--------+--------+  Proximal Graft      100                               +--------------------+--------+--------+--------+--------+  Mid Graft                                             +--------------------+--------+--------+--------+--------+  Distal Graft                                          +--------------------+--------+--------+--------+--------+  Distal Anastamosis                                     +--------------------+--------+--------+--------+--------+  Outflow                                               +--------------------+--------+--------+--------+--------+      Summary:     Left: No color or spectral Doppler flow beyond the proximal segment        (adjacent to the anastomosis) noted in the carotid to brachial        bypass graft.        Dampened monophasic waveforms in the radial and ulnar        arteries.        Of note, the left proximal ICA velocities (278/61 cm/s)        suggest high end 40-59% stenosis.  *See table(s) above for measurements and observations.    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               She denies fever, chills, or pain in the left UE.  Motor intact left UE.  Dry gangrene changes to first and third fingers.     Left UE occluded left carotis to brachial artery bypass with ischemic finger changes.               The left GSV was harvested for the bypass.  We will order right LE GSV mapping, CTA of the neck with known left ICA stenosis on duplex of  60-79% on previous duplex 03/2021.  I have then scheduled her for angiogram with left UE runoff and possible intervention.  She may need revision verses new bypass pending further work up.                 Patient seen in conjunction with Dr. Carlis Abbott today in office.   Continue current medically management with ASA, DM control and Statin daily.       Roxy Horseman PA-C [facility] Office: [phone]   MD in clinic [place]

## 2022-08-14 ENCOUNTER — Encounter (HOSPITAL_COMMUNITY): Payer: Self-pay | Admitting: Vascular Surgery

## 2022-08-14 LAB — CBC
HCT: 30.7 % — ABNORMAL LOW (ref 36.0–46.0)
Hemoglobin: 9.6 g/dL — ABNORMAL LOW (ref 12.0–15.0)
MCH: 27.4 pg (ref 26.0–34.0)
MCHC: 31.3 g/dL (ref 30.0–36.0)
MCV: 87.5 fL (ref 80.0–100.0)
Platelets: 272 10*3/uL (ref 150–400)
RBC: 3.51 MIL/uL — ABNORMAL LOW (ref 3.87–5.11)
RDW: 18 % — ABNORMAL HIGH (ref 11.5–15.5)
WBC: 7.5 10*3/uL (ref 4.0–10.5)
nRBC: 0 % (ref 0.0–0.2)

## 2022-08-14 LAB — GLUCOSE, CAPILLARY
Glucose-Capillary: 236 mg/dL — ABNORMAL HIGH (ref 70–99)
Glucose-Capillary: 228 mg/dL — ABNORMAL HIGH (ref 70–99)
Glucose-Capillary: 203 mg/dL — ABNORMAL HIGH (ref 70–99)
Glucose-Capillary: 151 mg/dL — ABNORMAL HIGH (ref 70–99)

## 2022-08-14 LAB — BASIC METABOLIC PANEL
Anion gap: 10 (ref 5–15)
BUN: 15 mg/dL (ref 8–23)
CO2: 20 mmol/L — ABNORMAL LOW (ref 22–32)
Calcium: 8.4 mg/dL — ABNORMAL LOW (ref 8.9–10.3)
Chloride: 105 mmol/L (ref 98–111)
Creatinine, Ser: 1.08 mg/dL — ABNORMAL HIGH (ref 0.44–1.00)
GFR, Estimated: 58 mL/min — ABNORMAL LOW (ref >60)
Glucose, Bld: 178 mg/dL — ABNORMAL HIGH (ref 70–99)
Potassium: 4.3 mmol/L (ref 3.5–5.1)
Sodium: 135 mmol/L (ref 135–145)

## 2022-08-14 LAB — LIPID PANEL
Cholesterol: 86 mg/dL (ref 0–200)
HDL: 28 mg/dL — ABNORMAL LOW (ref >40)
LDL Cholesterol: 43 mg/dL (ref 0–99)
Total CHOL/HDL Ratio: 3.1 RATIO
Triglycerides: 76 mg/dL (ref <150)
VLDL: 15 mg/dL (ref 0–40)

## 2022-08-14 MED ORDER — DIPHENHYDRAMINE HCL 25 MG PO CAPS
25.0000 mg | ORAL_CAPSULE | Freq: Once | ORAL | Status: AC
  Administered 2022-08-14: 25 mg via ORAL
  Filled 2022-08-14: qty 1

## 2022-08-14 MED ORDER — IBUPROFEN 600 MG PO TABS
600.0000 mg | ORAL_TABLET | Freq: Once | ORAL | Status: AC
  Administered 2022-08-14: 600 mg via ORAL
  Filled 2022-08-14: qty 1

## 2022-08-14 NOTE — Progress Notes (Signed)
Pt foley removed POD 1 per order. Pt tolerated well. DTV by 1400.  Raelyn Number, RN

## 2022-08-14 NOTE — Plan of Care (Signed)
  Problem: Education: Goal: Understanding of CV disease, CV risk reduction, and recovery process will improve Outcome: Progressing Goal: Individualized Educational Video(s) Outcome: Progressing   Problem: Activity: Goal: Ability to return to baseline activity level will improve Outcome: Progressing   Problem: Cardiovascular: Goal: Ability to achieve and maintain adequate cardiovascular perfusion will improve Outcome: Progressing Goal: Vascular access site(s) Level 0-1 will be maintained Outcome: Progressing   Problem: Health Behavior/Discharge Planning: Goal: Ability to safely manage health-related needs after discharge will improve Outcome: Progressing   Problem: Education: Goal: Ability to describe self-care measures that may prevent or decrease complications (Diabetes Survival Skills Education) will improve Outcome: Progressing Goal: Individualized Educational Video(s) Outcome: Progressing   Problem: Coping: Goal: Ability to adjust to condition or change in health will improve Outcome: Progressing   Problem: Fluid Volume: Goal: Ability to maintain a balanced intake and output will improve Outcome: Progressing   Problem: Health Behavior/Discharge Planning: Goal: Ability to identify and utilize available resources and services will improve Outcome: Progressing Goal: Ability to manage health-related needs will improve Outcome: Progressing   Problem: Metabolic: Goal: Ability to maintain appropriate glucose levels will improve Outcome: Progressing   Problem: Nutritional: Goal: Maintenance of adequate nutrition will improve Outcome: Progressing Goal: Progress toward achieving an optimal weight will improve Outcome: Progressing   Problem: Skin Integrity: Goal: Risk for impaired skin integrity will decrease Outcome: Progressing   Problem: Tissue Perfusion: Goal: Adequacy of tissue perfusion will improve Outcome: Progressing

## 2022-08-14 NOTE — Evaluation (Signed)
Clinical/Bedside Swallow Evaluation Patient Details  Name: Meagan [name] MRN: [id number] Date of Birth: 26-Dec-1960  Today's Date: 08/14/2022 Time: SLP Start Time (ACUTE ONLY): 61 SLP Stop Time (ACUTE ONLY): 1443 SLP Time Calculation (min) (ACUTE ONLY): 22 min  Past Medical History:  Past Medical History:  Diagnosis Date   Arthritis    Chronic kidney disease    Complication of anesthesia    Diabetes mellitus (HCC)    GERD (gastroesophageal reflux disease)    Hyperlipidemia    Hypertension    Neuropathy    Pneumonia    PONV (postoperative nausea and vomiting)    Past Surgical History:  Past Surgical History:  Procedure Laterality Date   AORTIC ARCH ANGIOGRAPHY N/A 08/01/2021   Procedure: AORTIC ARCH ANGIOGRAPHY;  Surgeon: Marty Heck, MD;  Location: [place] [facility];  Service: Cardiovascular;  Laterality: N/A;   AORTIC ARCH ANGIOGRAPHY N/A 08/07/2022   Procedure: AORTIC ARCH ANGIOGRAPHY;  Surgeon: Marty Heck, MD;  Location: [place] [facility];  Service: Cardiovascular;  Laterality: N/A;   CAROTID-SUBCLAVIAN BYPASS GRAFT Left 08/12/2021   Procedure: LEFT CAROTID-BRACHIAL ARTERY BYPASS using Left greater saphenous Vein, harvested from left leg.;  Surgeon: Marty Heck, MD;  Location: [facility] OR;  Service: Vascular;  Laterality: Left;   CARPAL TUNNEL RELEASE Left 01/31/2021   CARPAL TUNNEL RELEASE Right 2021   Dr. Mardelle Matte   DILATION AND CURETTAGE OF UTERUS  2007   ROTATOR CUFF REPAIR Right 2018   Dr. Mardelle Matte   TUBAL LIGATION  1992   UPPER EXTREMITY ANGIOGRAPHY Left 08/01/2021   Procedure: UPPER EXTREMITY ANGIOGRAPHY;  Surgeon: Marty Heck, MD;  Location: [place] [facility];  Service: Cardiovascular;  Laterality: Left;   UPPER EXTREMITY ANGIOGRAPHY Left 08/07/2022   Procedure: Upper Extremity Angiography;  Surgeon: Marty Heck, MD;  Location: [place] [facility];  Service: Cardiovascular;  Laterality: Left;   HPI:  61 year old female s/p Left  carotid endarterectomy with bovine patch angioplasty 9/13 with new complaints of dysphagia.    Assessment / Plan / Recommendation  Clinical Impression  Patient presents with evidence of a mild pharyngeal phase dysphagia characterized by consistent throat clearing, both wtih and without pos, unchanged with varying po consistencies. Note moderate left sided neck swelling. Suspect post op internal edema and brief intubation are primary factors for dysphagia. Cannot r/o manipulation of CN nerves (IX, X) as contributing factor however patient with clear voice, no lingual deviation, and normal palatal elevation. Throat clear is strong and likely productive to clear possible penetrates/aspirates with po intake at this time. If swallowing difficulty persists or worsens, may consider instrumental testing. Otherwise, hope to see improvement with improved edema. SLP will f/u. SLP Visit Diagnosis: Dysphagia, pharyngeal phase (R13.13)    Aspiration Risk       Diet Recommendation Regular;Thin liquid (educated patient on choosing softer items)   Liquid Administration via: Cup;Straw Medication Administration: Whole meds with liquid Supervision: Patient able to self feed Compensations: Slow rate;Small sips/bites Postural Changes: Seated upright at 90 degrees    Other  Recommendations Oral Care Recommendations: Oral care BID    Recommendations for follow up therapy are one component of a multi-disciplinary discharge planning process, led by the attending physician.  Recommendations may be updated based on patient status, additional functional criteria and insurance authorization.  Follow up Recommendations        Assistance Recommended at Discharge    Functional Status Assessment    Frequency and Duration min 2x/week  2 weeks       Prognosis        Swallow Study   General HPI: 61 year old female s/p Left carotid endarterectomy with bovine patch angioplasty 9/13 with new complaints of  dysphagia. Type of Study: Bedside Swallow Evaluation Previous Swallow Assessment: none Diet Prior to this Study: Regular;Thin liquids Temperature Spikes Noted: No Respiratory Status: Room air History of Recent Intubation: Yes (for surgery only) Length of Intubations (days):  (less than 24 hours) Date extubated: 08/13/22 Behavior/Cognition: Alert;Cooperative;Pleasant mood Oral Cavity Assessment: Within Functional Limits Oral Care Completed by SLP: No Oral Cavity - Dentition: Adequate natural dentition Vision: Functional for self-feeding Self-Feeding Abilities: Able to feed self Patient Positioning: Upright in bed Baseline Vocal Quality: Normal Volitional Cough: Strong Volitional Swallow: Able to elicit    Oral/Motor/Sensory Function Overall Oral Motor/Sensory Function: Within functional limits   Ice Chips Ice chips: Impaired Presentation: Spoon Pharyngeal Phase Impairments: Throat Clearing - Immediate   Thin Liquid Thin Liquid: Impaired Presentation: Cup;Self Fed;Straw Pharyngeal  Phase Impairments: Throat Clearing - Immediate    Nectar Thick Nectar Thick Liquid: Not tested   Honey Thick Honey Thick Liquid: Not tested   Puree Puree: Impaired Presentation: Spoon;Self Fed Pharyngeal Phase Impairments: Throat Clearing - Immediate   Solid     Solid: Impaired Presentation: Self Fed Pharyngeal Phase Impairments: Throat Clearing - Immediate     Sande Pickert MA, CCC-SLP  Devean Skoczylas Meryl 08/14/2022,12:48 PM

## 2022-08-14 NOTE — Progress Notes (Addendum)
[facility] [place]  Subjective  - Doing well over all   Objective (!) 130/34 96 98.6 F (37 C) (Oral) 15 94%  Intake/Output Summary (Last 24 hours) at 08/14/2022 0702 Last data filed at 08/14/2022 0646 Gross per 24 hour  Intake 1940 ml  Output 3170 ml  Net -1230 ml    Left hand with palpable radial pulse Motor intact and sensation.  No change in finger tip appearance.     Assessment/Planning:  Previous failed left carotid to brachial bypass 08/12/21.  She had significant tissue loss to the left upper extremity related to a hand infection after a pedicure.   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  Left carotid endarterectomy with bovine patch angioplasty 2.  Left intraoperative carotid ultrasound 3.  Re-do left common carotid artery to brachial artery bypass using 6 mm ringed PTFE  Patent left UE bypass left common carotid artery to brachial artery bypass with palpable radial pulse  Elevation, movement, and pain control.  Possible discharge tomorrow. ASA and Statin daily  RN called with patient complaints for difficulty/painful swallowing.  I have placed a speech consult and asked for Chloraseptic spray to bedside.   Roxy Horseman 08/14/2022 7:02 AM --  Laboratory Lab Results: Recent Labs    08/13/22 0647 08/14/22 0206  WBC 7.8 7.5  HGB 13.4 9.6*  HCT 43.6 30.7*  PLT 433* 272   BMET Recent Labs    08/13/22 0647 08/14/22 0206  NA 138 135  K 3.9 4.3  CL 108 105  CO2 21* 20*  GLUCOSE 138* 178*  BUN 20 15  CREATININE 1.29* 1.08*  CALCIUM 8.8* 8.4*    COAG Lab Results  Component Value Date   INR 1.1 08/13/2022   INR 0.9 08/08/2021   No results found for: "PTT"  I have seen and evaluated the patient. I agree with the PA note as documented above.  Postop day 1 status post left carotid endarterectomy for high-grade asymptomatic  stenosis with a left carotid to brachial artery bypass with PTFE for left upper extremity tissue loss.  All of her incisions look great.  She has a palpable radial pulse at the left wrist.  Aspirin statin for risk reduction.  We will plan to keep her 1 more day.  States she had some sore throat overnight with some trouble swallowing.  We will monitor this and may need speech evaluation.  Grossly neurologically intact otherwise.  Marty Heck, MD [facility] [place] Office: [phone]

## 2022-08-14 NOTE — Progress Notes (Signed)
Mobility Specialist Progress Note:   08/14/22 1155  Mobility  Activity Transferred to/from [facility]  Level of Assistance Standby assist, set-up cues, supervision of patient - no hands on  Assistive Device None  Distance Ambulated (ft) 4 ft  Activity Response Tolerated well  $Mobility charge 1 Mobility   Pt received in bed asking to use bathroom. No complaints. Left in bed with call bell in reach and all needs met.   [facility] Surveyor, mining Chat only

## 2022-08-15 LAB — GLUCOSE, CAPILLARY
Glucose-Capillary: 161 mg/dL — ABNORMAL HIGH (ref 70–99)
Glucose-Capillary: 174 mg/dL — ABNORMAL HIGH (ref 70–99)

## 2022-08-15 MED ORDER — OXYCODONE-ACETAMINOPHEN 5-325 MG PO TABS: 1.0000 | ORAL_TABLET | Freq: Four times a day (QID) | ORAL | 0 refills | Status: DC | PRN

## 2022-08-15 NOTE — Progress Notes (Signed)
Speech Language Pathology Treatment: Dysphagia  Patient Details Name: Meagan [name] MRN: [id number] DOB: 10-28-1961 Today's Date: 08/15/2022 Time: 9450-3888 SLP Time Calculation (min) (ACUTE ONLY): 13 min  Assessment / Plan / Recommendation Clinical Impression  Patient today seen for skilled SLP for dysphagia management.  Throat clearing ongoing at baseline and with po intake and pt reports globus.  Voice is near baseline per pt and her cough/expectoration is strong.  Pt reports improved swallow function - level 7/10 - 10 normal - and yesterday level 4/10.  Observed pt with intake including crackers, Glucerna, water, peaches and applesauce.  Throat clearing noted with approx 20% of boluses and one single cough *subtle* observed.  There were not multiple swallows to indicate potential retention and after approximately 1/3 of snack when pt cued to expectorate, she cleared only secretions.   Reviewed recommendations with pt using teach back and advised she follow up with her MD if her dysphagia does not resolve with resolution of edema.  Anticipate swallow function to return to baseline with subsiding of edema.  Marland Kitchen   HPI  61 year old female s/p Left carotid endarterectomy with bovine patch angioplasty 9/13 with new complaints of dysphagia.       SLP Plan  Continue with current plan of care      Recommendations for follow up therapy are one component of a multi-disciplinary discharge planning process, led by the attending physician.  Recommendations may be updated based on patient status, additional functional criteria and insurance authorization.    Recommendations  Diet recommendations: Regular;Thin liquid Liquids provided via: Cup;Straw Medication Administration: Whole meds with liquid Supervision: Patient able to self feed Compensations: Slow rate;Small sips/bites Postural Changes and/or Swallow Maneuvers: Seated upright 90 degrees;Upright 30-60 min after meal                Oral  Care Recommendations: Oral care BID Follow Up Recommendations: No SLP follow up Assistance recommended at discharge: None SLP Visit Diagnosis: Dysphagia, pharyngeal phase (R13.13) Plan: Continue with current plan of care         Meagan Lime, MS [place] Office [phone] Pager [phone]   Meagan [name]  08/15/2022, 8:30 AM

## 2022-08-15 NOTE — Progress Notes (Addendum)
Heart rate going up to 120-130 when ambulating in room and going to bathroom then come back down to S.R. to S>T. Low 100's patient is unaware that her heart rate goes up with activity. No complaints voiced.

## 2022-08-15 NOTE — Progress Notes (Addendum)
[facility] [place]  Subjective  - feels better, she feels more comfortable    Objective 101/75 (!) 105 98 F (36.7 C) (Oral) 20 90%  Intake/Output Summary (Last 24 hours) at 08/15/2022 0647 Last data filed at 08/15/2022 0543 Gross per 24 hour  Intake 240 ml  Output 150 ml  Net 90 ml   Left hand with palpable radial pulse Motor intact and sensation.  No change in finger tip appearance. 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  Left carotid endarterectomy with bovine patch angioplasty 2.  Left intraoperative carotid ultrasound 3.  Re-do left common carotid artery to brachial artery bypass using 6 mm ringed PTFE  Swallow eval performed yesterday Regular diet Pending pain control, tolerating breakfast, and mobility Plan for discharge home today F/U for incision checks in 2-3 weeks and later for arterial duplex and and carotid duplex    Roxy Horseman 08/15/2022 6:47 AM --  Laboratory Lab Results: Recent Labs    08/13/22 0647 08/14/22 0206  WBC 7.8 7.5  HGB 13.4 9.6*  HCT 43.6 30.7*  PLT 433* 272   BMET Recent Labs    08/13/22 0647 08/14/22 0206  NA 138 135  K 3.9 4.3  CL 108 105  CO2 21* 20*  GLUCOSE 138* 178*  BUN 20 15  CREATININE 1.29* 1.08*  CALCIUM 8.8* 8.4*    COAG Lab Results  Component Value Date   INR 1.1 08/13/2022   INR 0.9 08/08/2021   No results found for: "PTT"  I have seen and evaluated the patient. I agree with the PA note as documented above.  Postop day 2 status post left carotid endarterectomy for high-grade asymptomatic stenosis with a redo left common carotid to brachial bypass with 6 mm ringed PTFE for gangrene of the left fingertips (critical limb ischemia left upper extremity).  Neck incision looks good and is soft.  She has a palpable radial pulse at the left wrist.   Her mild dysphagia is improving.  Appreciate speech seeing her.  Plan discharge today.  Discussed aspirin and high-dose statin.  We will arrange follow-up in 2 to 3 weeks for incision check.  Marty Heck, MD [facility] [place] Office: [phone]

## 2022-08-15 NOTE — Discharge Instructions (Signed)
   [facility] [place]  Discharge Instructions   Carotid Endarterectomy (CEA)  Please refer to the following instructions for your post-procedure care. Your surgeon or physician assistant will discuss any changes with you.  Activity  You are encouraged to walk as much as you can. You can slowly return to normal activities but must avoid strenuous activity and heavy lifting until your doctor tell you it's OK. Avoid activities such as vacuuming or swinging a golf club. You can drive after one week if you are comfortable and you are no longer taking prescription pain medications. It is normal to feel tired for serval weeks after your surgery. It is also normal to have difficulty with sleep habits, eating, and bowel movements after surgery. These will go away with time.  Bathing/Showering  You may shower after you come home. Do not soak in a bathtub, hot tub, or swim until the incision heals completely.  Incision Care  Shower every day. Clean your incision with mild soap and water. Pat the area dry with a clean towel. You do not need a bandage unless otherwise instructed. Do not apply any ointments or creams to your incision. You may have skin glue on your incision. Do not peel it off. It will come off on its own in about one week. Your incision may feel thickened and raised for several weeks after your surgery. This is normal and the skin will soften over time. For Men Only: It's OK to shave around the incision but do not shave the incision itself for 2 weeks. It is common to have numbness under your chin that could last for several months.  Diet  Resume your normal diet. There are no special food restrictions following this procedure. A low fat/low cholesterol diet is recommended for all patients with vascular disease. In order to heal from your surgery, it is CRITICAL to get adequate nutrition. Your body requires vitamins, minerals, and protein. Vegetables are the best  source of vitamins and minerals. Vegetables also provide the perfect balance of protein. Processed food has little nutritional value, so try to avoid this.        Medications  Resume taking all of your medications unless your doctor or physician assistant tells you not to. If your incision is causing pain, you may take over-the- counter pain relievers such as acetaminophen (Tylenol). If you were prescribed a stronger pain medication, please be aware these medications can cause nausea and constipation. Prevent nausea by taking the medication with a snack or meal. Avoid constipation by drinking plenty of fluids and eating foods with a high amount of fiber, such as fruits, vegetables, and grains. Do not take Tylenol if you are taking prescription pain medications.  Follow Up  Our office will schedule a follow up appointment 2-3 weeks following discharge.  Please call us immediately for any of the following conditions  Increased pain, redness, drainage (pus) from your incision site. Fever of 101 degrees or higher. If you should develop stroke (slurred speech, difficulty swallowing, weakness on one side of your body, loss of vision) you should call 911 and go to the nearest emergency room.  Reduce your risk of vascular disease:  Stop smoking. If you would like help call [facility] at 1-800-QUIT-NOW (1-800-784-8669) or [place] at 336-586-4000. Manage your cholesterol Maintain a desired weight Control your diabetes Keep your blood pressure down  If you have any questions, please call the office at [phone].   

## 2022-08-15 NOTE — Progress Notes (Signed)
RN notified of [place].

## 2022-08-15 NOTE — Progress Notes (Signed)
Pt discharged from unit. Medication/discharge instruction given  Odeth Bry K Anastasiya Gowin, RN  

## 2022-08-15 NOTE — TOC Transition Note (Signed)
Transition of Care (TOC) - CM/SW Discharge Note Marvetta Gibbons RN, BSN Transitions of Care Unit 4E- RN Case Manager See Treatment Team for direct phone #    Patient Details  Name: Lollie Gunner MRN: [id number] Date of Birth: 1961/04/13  Transition of Care [facility]) CM/SW Contact:  Dawayne Patricia, RN Phone Number: 08/15/2022, 10:32 AM   Clinical Narrative:    Pt stable for transition home today, Transition of Care Department [facility]) has reviewed patient and no TOC needs have been identified at this time.  PCP-  [place]endale Chard Transportation- family Medication barriers- none identified     Final next level of care: Home/Self Care Barriers to Discharge: No Barriers Identified   Patient Goals and CMS Choice     Choice offered to / list presented to : NA  Discharge Placement               Home        Discharge Plan and Services                DME Arranged: N/A DME Agency: NA       HH Arranged: NA HH Agency: NA        Social Determinants of Health (SDOH) Interventions     Readmission Risk Interventions    08/15/2022   10:32 AM  Readmission Risk Prevention Plan  Transportation Screening Complete  PCP or Specialist Appt within 5-7 Days Complete  Medication Review (RN CM) Complete

## 2022-08-17 ENCOUNTER — Other Ambulatory Visit: Payer: Self-pay | Admitting: Internal Medicine

## 2022-08-20 NOTE — Discharge Summary (Signed)
[facility] Discharge Summary   Patient ID:  Meagan [name] MRN: [id number] DOB/AGE: 61/16/1962 61 y.o.  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 REDO LEFT COMMON CAROTID-BRACHIAL ARTERY BYPASS USING PROPATEN GRAFT.  Discharged Condition: good  HPI: Meagan [name] is a 61 y.o. female who presents with gangrene of the left hand on the tips of her digits with an occluded left common carotid to brachial artery bypass performed with vein tunneled anatomic last year.  Her finger wounds ultimately healed in the past and then her bypass occluded and then she developed new tissue loss in the left hand.  She presents today for left carotid endarterectomy for high-grade asymptomatic stenosis and then a redo left common carotid artery to brachial artery bypass.   Hospital Course:  Meagan [name] is a 61 y.o. female is S/P  Procedure(s): ENDARTERECTOMY CAROTID WITH BOVINE PATCH. REDO LEFT COMMON CAROTID-BRACHIAL ARTERY BYPASS USING PROPATEN GRAFT.  Left UE palpable radial pulses, incision are healing well without neurologic deficits.  She reported feeling like it was difficult to swallow.  Speech eval was performed and she was cleared for regular diet.  She was discharged home in stable condition.   F/U for incision checks in 2-3 weeks and later for arterial duplex and and carotid  duplex  Consults:    Significant Diagnostic Studies: CBC Lab Results  Component Value Date   WBC 7.5 08/14/2022   HGB 9.6 (L) 08/14/2022   HCT 30.7 (L) 08/14/2022   MCV 87.5 08/14/2022   PLT 272 08/14/2022    BMET    Component Value Date/Time   NA 135 08/14/2022 0206   NA 138 07/30/2022 1222   K 4.3 08/14/2022 0206   CL 105 08/14/2022 0206   CO2 20 (L) 08/14/2022 0206   GLUCOSE 178 (H) 08/14/2022 0206   BUN 15 08/14/2022 0206   BUN 16 07/30/2022 1222   CREATININE 1.08 (H) 08/14/2022 0206   CALCIUM 8.4 (L) 08/14/2022 0206   GFRNONAA 58 (L) 08/14/2022 0206   GFRAA 70 10/29/2020 1109   COAG Lab Results  Component Value Date   INR 1.1 08/13/2022   INR 0.9 08/08/2021     Disposition:  Discharge to :Home Discharge Instructions     Call MD for:  redness, tenderness, or signs of infection (pain, swelling, bleeding, redness, odor or green/yellow discharge around incision site)   Complete by: As directed    Call MD for:  severe or increased pain, loss or decreased feeling  in affected limb(s)   Complete by: As directed    Call MD for:  temperature >100.5   Complete by: As directed    Resume previous diet   Complete by: As directed       Allergies as of 08/15/2022       Reactions   Promethazine Hcl Shortness Of Breath, Other (See Comments)   Bells palsy   Lisinopril Itching, Swelling  Medication List     TAKE these medications    aspirin EC 81 MG tablet Take 1 tablet (81 mg total) by mouth daily. Swallow whole. What changed: additional instructions   atorvastatin 80 MG tablet Commonly known as: LIPITOR Take 0.5 tablets (40 mg total) by mouth daily. What changed:  how much to take additional instructions   B-D ULTRAFINE III SHORT PEN 31G X 8 MM Misc Generic drug: Insulin Pen Needle Use as directed with insulin pen   DIALYVITE VITAMIN D 5000 PO Take 10,000 Units by mouth in the morning.   diclofenac Sodium 1 % Gel Commonly known as:  VOLTAREN Use as directed, apply to affected joints tid prn What changed:  how much to take how to take this when to take this reasons to take this   ferrous sulfate 325 (65 FE) MG tablet TAKE 1 TABLET BY MOUTH EVERY DAY WITH BREAKFAST What changed: See the new instructions.   FISH OIL PO Take 1 capsule by mouth in the morning.   gabapentin 300 MG capsule Commonly known as: NEURONTIN TAKE 1 CAPSULE BY MOUTH 3 (THREE) TIMES DAILY. TAKE 2 CAPSULES TO EQUAL 600MG AT NIGHT   MAGNESIUM PO Take 1 tablet by mouth daily.   multivitamin with minerals Tabs tablet Take 1 tablet by mouth daily.   NovoLOG FlexPen 100 UNIT/ML FlexPen Generic drug: insulin aspart INJECT 12 UNITS 3 TIMES PER DAY WITH MEALS NOT TO EXCEED 50 UNITS   OneTouch Verio Flex System w/Device Kit Use as directed to check blood sugars 2 times per day dx: e11.65   OneTouch Verio test strip Generic drug: glucose blood USE TO CHECK BLOOD SUGAR TWICE A DAY   oxyCODONE-acetaminophen 5-325 MG tablet Commonly known as: PERCOCET/ROXICET Take 1 tablet by mouth every 6 (six) hours as needed for moderate pain.   Ozempic (2 MG/DOSE) 8 MG/3ML Sopn Generic drug: Semaglutide (2 MG/DOSE) Inject 2 mg into the skin once a week. What changed: when to take this   PriLOSEC OTC 20 MG tablet Generic drug: omeprazole Take 20 mg by mouth daily.   sertraline 50 MG tablet Commonly known as: ZOLOFT TAKE 1 TABLET BY MOUTH EVERY DAY   Synjardy XR 25-1000 MG Tb24 Generic drug: Empagliflozin-metFORMIN HCl ER Take one tablet by mouth daily. START AFTER PROCEDURE.   Tyler Aas FlexTouch 200 UNIT/ML FlexTouch Pen Generic drug: insulin degludec INJECT 66 UNITS SUBCUTANEOUSLY DAILY What changed:  how much to take how to take this when to take this additional instructions   Turmeric 500 MG Caps Take 500 mg by mouth daily.   valACYclovir 500 MG tablet Commonly known as: VALTREX TAKE 1 TABLET (500 MG TOTAL) BY MOUTH IN THE MORNING        Verbal and written Discharge instructions given to the patient. Wound care per Discharge AVS  Follow-up Information     Marty Heck, MD Follow up in 2 week(s).   Specialty: Vascular Surgery Why: Office will call you to arrange your appt (sent) Contact information: [place] [address] [phone]                 Signed: Roxy Horseman 08/20/2022, 11:46 AM --- For VQI Registry use --- Instructions: Press F2 to tab through selections.  Delete question if not applicable.   Modified Rankin score at D/C (0-6): Rankin Score=0  IV medication needed for:  1. Hypertension: No 2. Hypotension: No  Post-op Complications: No  1. Post-op CVA or TIA: No  If  yes: Event classification (right eye, left eye, right cortical, left cortical, verterobasilar, other):   If yes: Timing of event (intra-op, <6 hrs post-op, >=6 hrs post-op, unknown):   2. CN injury: No  If yes: CN  injuried   3. Myocardial infarction: No  If yes: Dx by (EKG or clinical, Troponin):   4.  CHF: No  5.  Dysrhythmia (new): No  6. Wound infection: No  7. Reperfusion symptoms: No  8. Return to OR: No  If yes: return to OR for (bleeding, neurologic, other CEA incision, other):   Discharge medications: Statin use:  Yes ASA use:  Yes Beta blocker use:  No  for medical reason not indicated ACE-Inhibitor use:  No  for medical reason not indicated P2Y12 Antagonist use: [ x] None, _0  Plavix, _1  Plasugrel, _2  Ticlopinine, _3  Ticagrelor, _4  Other, _5  No for medical reason, _6  Non-compliant, _7  Not-indicated Anti-coagulant use:  [x ] None, _8  Warfarin, _9  Rivaroxaban, _10  Dabigatran, _11  Other, _12  No for medical reason, _13  Non-compliant, _14  Not-indicated

## 2022-08-24 ENCOUNTER — Other Ambulatory Visit: Payer: Self-pay | Admitting: Internal Medicine

## 2022-08-26 ENCOUNTER — Ambulatory Visit (INDEPENDENT_AMBULATORY_CARE_PROVIDER_SITE_OTHER): Payer: BC Managed Care – PPO

## 2022-08-26 VITALS — BP 122/68 | HR 67 | Temp 98.5°F

## 2022-08-26 DIAGNOSIS — Z23 Encounter for immunization: Secondary | ICD-10-CM | POA: Diagnosis not present

## 2022-08-26 MED ORDER — EMPAGLIFLOZIN 25 MG PO TABS: 25.0000 mg | ORAL_TABLET | Freq: Every day | ORAL | 1 refills | Status: DC

## 2022-08-26 NOTE — Patient Instructions (Signed)
Influenza Virus Vaccine injection What is this medication? INFLUENZA VIRUS VACCINE (in floo EN zuh VAHY ruhs vak SEEN) helps to reduce the risk of getting influenza also known as the flu. The vaccine only helps protect you against some strains of the flu. This medicine may be used for other purposes; ask your health care provider or pharmacist if you have questions. COMMON BRAND NAME(S): Afluria, Afluria Quadrivalent, Agriflu, Alfuria, FLUAD, FLUAD Quadrivalent, Fluarix, Fluarix Quadrivalent, Flublok, Flublok Quadrivalent, FLUCELVAX, FLUCELVAX Quadrivalent, Flulaval, Flulaval Quadrivalent, Fluvirin, Fluzone, Fluzone High-Dose, Fluzone Intradermal, Fluzone Quadrivalent What should I tell my care team before I take this medication? They need to know if you have any of these conditions: bleeding disorder like hemophilia fever or infection Guillain-Barre syndrome or other neurological problems immune system problems infection with the human immunodeficiency virus (HIV) or AIDS low blood platelet counts multiple sclerosis an unusual or allergic reaction to influenza virus vaccine, latex, other medicines, foods, dyes, or preservatives. Different brands of vaccines contain different allergens. Some may contain latex or eggs. Talk to your doctor about your allergies to make sure that you get the right vaccine. pregnant or trying to get pregnant breast-feeding How should I use this medication? This vaccine is for injection into a muscle or under the skin. It is given by a health care professional. A copy of Vaccine Information Statements will be given before each vaccination. Read this sheet carefully each time. The sheet may change frequently. Talk to your healthcare provider to see which vaccines are right for you. Some vaccines should not be used in all age groups. Overdosage: If you think you have taken too much of this medicine contact a poison control center or emergency room at once. NOTE: This  medicine is only for you. Do not share this medicine with others. What if I miss a dose? This does not apply. What may interact with this medication? chemotherapy or radiation therapy medicines that lower your immune system like etanercept, anakinra, infliximab, and adalimumab medicines that treat or prevent blood clots like warfarin phenytoin steroid medicines like prednisone or cortisone theophylline vaccines This list may not describe all possible interactions. Give your health care provider a list of all the medicines, herbs, non-prescription drugs, or dietary supplements you use. Also tell them if you smoke, drink alcohol, or use illegal drugs. Some items may interact with your medicine. What should I watch for while using this medication? Report any side effects that do not go away within 3 days to your doctor or health care professional. Call your health care provider if any unusual symptoms occur within 6 weeks of receiving this vaccine. You may still catch the flu, but the illness is not usually as bad. You cannot get the flu from the vaccine. The vaccine will not protect against colds or other illnesses that may cause fever. The vaccine is needed every year. What side effects may I notice from receiving this medication? Side effects that you should report to your doctor or health care professional as soon as possible: allergic reactions like skin rash, itching or hives, swelling of the face, lips, or tongue Side effects that usually do not require medical attention (report to your doctor or health care professional if they continue or are bothersome): fever headache muscle aches and pains pain, tenderness, redness, or swelling at the injection site tiredness This list may not describe all possible side effects. Call your doctor for medical advice about side effects. You may report side effects to FDA at 1-800-FDA-1088.   Where should I keep my medication? The vaccine will be given  by a health care professional in a clinic, pharmacy, doctor's office, or other health care setting. You will not be given vaccine doses to store at home. NOTE: This sheet is a summary. It may not cover all possible information. If you have questions about this medicine, talk to your doctor, pharmacist, or health care provider.  2023 Elsevier/Gold Standard (2021-06-21 00:00:00)  

## 2022-09-02 ENCOUNTER — Ambulatory Visit (INDEPENDENT_AMBULATORY_CARE_PROVIDER_SITE_OTHER): Payer: BC Managed Care – PPO | Admitting: Physician Assistant

## 2022-09-02 VITALS — Temp 97.8°F | Resp 20 | Ht 63.0 in | Wt 197.2 lb

## 2022-09-02 DIAGNOSIS — I6522 Occlusion and stenosis of left carotid artery: Secondary | ICD-10-CM

## 2022-09-02 DIAGNOSIS — T82898A Other specified complication of vascular prosthetic devices, implants and grafts, initial encounter: Secondary | ICD-10-CM

## 2022-09-02 NOTE — Progress Notes (Signed)
POST OPERATIVE OFFICE NOTE    CC:  F/u for surgery  HPI:  This is a 61 y.o. female who is s/p left carotid endarterectomy with bovine patch angioplasty and redo left common carotid artery to brachial artery bypass using PTFE by Dr. 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  Patient believes her incisions have completely healed.  She also believes the gangrenous fingertip on her left hand is improving since surgery.  She denies any strokelike symptoms perioperatively.  She is on aspirin and statin daily.  She would like a referral to a different Copy.  Allergies  Allergen Reactions   Promethazine Hcl Shortness Of Breath and Other (See Comments)    Bells palsy   Lisinopril Itching and Swelling    Current Outpatient Medications  Medication Sig Dispense Refill   amLODipine (NORVASC) 5 MG tablet TAKE 1 TABLET BY MOUTH EVERY DAY 90 tablet 1   aspirin EC 81 MG tablet Take 1 tablet (81 mg total) by mouth daily. Swallow whole. (Patient taking differently: Take 81 mg by mouth daily. Swallow whole.  Takes at hs) 150 tablet 2   atorvastatin (LIPITOR) 80 MG tablet Take 0.5 tablets (40 mg total) by mouth daily. (Patient taking differently: Take 80 mg by mouth daily. Takes at hs) 90 tablet 1   Blood Glucose Monitoring Suppl ([place]) w/Device KIT Use as directed to check blood sugars 2 times per day dx: e11.65 1 kit 1   Cholecalciferol (DIALYVITE VITAMIN D 5000 PO) Take 10,000 Units by mouth in the morning.     diclofenac Sodium (VOLTAREN) 1 % GEL Use as directed, apply to affected joints tid prn (Patient taking differently: Apply 1 Application topically 3 (three) times daily as needed (joint pain). Use as directed, apply to affected joints tid prn) 150 g 2   empagliflozin (JARDIANCE) 25 MG TABS tablet Take 1 tablet (25 mg total) by mouth daily before breakfast. 90 tablet 1   ferrous  sulfate 325 (65 FE) MG tablet TAKE 1 TABLET BY MOUTH EVERY DAY WITH BREAKFAST (Patient taking differently: Take 325 mg by mouth daily with breakfast.) 90 tablet 1   gabapentin (NEURONTIN) 300 MG capsule TAKE 1 CAPSULE BY MOUTH 3 (THREE) TIMES DAILY. TAKE 2 CAPSULES TO EQUAL 600MG AT NIGHT 180 capsule 2   glucose blood (ONETOUCH VERIO) test strip USE TO CHECK BLOOD SUGAR TWICE A DAY 100 strip 12   insulin degludec (TRESIBA FLEXTOUCH) 200 UNIT/ML FlexTouch Pen INJECT 66 UNITS SUBCUTANEOUSLY DAILY (Patient taking differently: Inject 70 Units into the skin every evening.) 200 mL 1   Insulin Pen Needle (B-D ULTRAFINE III SHORT PEN) 31G X 8 MM MISC Use as directed with insulin pen 100 each 3   MAGNESIUM PO Take 1 tablet by mouth daily.     Multiple Vitamin (MULTIVITAMIN WITH MINERALS) TABS tablet Take 1 tablet by mouth daily.     NOVOLOG FLEXPEN 100 UNIT/ML FlexPen INJECT 12 UNITS 3 TIMES PER DAY WITH MEALS NOT TO EXCEED 50 UNITS 15 mL 1   Omega-3 Fatty Acids (FISH OIL PO) Take 1 capsule by mouth in the morning.     oxyCODONE-acetaminophen (PERCOCET/ROXICET) 5-325 MG tablet Take 1 tablet by mouth every 6 (six) hours as needed for moderate pain. 30 tablet 0   PRILOSEC OTC 20 MG tablet Take 20 mg by mouth daily.      Semaglutide, 2 MG/DOSE, (OZEMPIC,  2 MG/DOSE,) 8 MG/3ML SOPN Inject 2 mg into the skin once a week. (Patient taking differently: Inject 2 mg into the skin every Tuesday.) 3 mL 0   sertraline (ZOLOFT) 50 MG tablet TAKE 1 TABLET BY MOUTH EVERY DAY 90 tablet 0   Turmeric 500 MG CAPS Take 500 mg by mouth daily.     valACYclovir (VALTREX) 500 MG tablet TAKE 1 TABLET (500 MG TOTAL) BY MOUTH IN THE MORNING 90 tablet 1   No current facility-administered medications for this visit.     ROS:  See HPI  Physical Exam:  Vitals:   09/02/22 1521  Resp: 20  Temp: 97.8 F (36.6 C)  TempSrc: Temporal  Weight: 197 lb 3.2 oz (89.4 kg)  Height: _0  (1.6 m)    Incision: Left neck and brachial  incision well-healed Extremities: Palpable left radial pulse Neuro: Cranial nerves grossly intact  Assessment/Plan:  This is a 61 y.o. 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

## 2022-09-03 ENCOUNTER — Other Ambulatory Visit: Payer: Self-pay

## 2022-09-03 ENCOUNTER — Telehealth: Payer: Self-pay | Admitting: *Deleted

## 2022-09-03 DIAGNOSIS — I6522 Occlusion and stenosis of left carotid artery: Secondary | ICD-10-CM

## 2022-09-03 NOTE — Telephone Encounter (Signed)
Patient called requesting when she was able to return to work and to exercise.  The patient has a desk/computer job.  I spoke to [place], [place]. The patient should be able to return to work at this time and exercise, cardio but no weight lifting for six weeks.  Patient voiced understanding of the instructions.

## 2022-09-15 ENCOUNTER — Encounter (HOSPITAL_COMMUNITY): Payer: Self-pay | Admitting: Vascular Surgery

## 2022-09-16 ENCOUNTER — Other Ambulatory Visit: Payer: Self-pay | Admitting: Internal Medicine

## 2022-09-16 ENCOUNTER — Encounter (HOSPITAL_COMMUNITY): Payer: Self-pay | Admitting: Vascular Surgery

## 2022-09-16 ENCOUNTER — Other Ambulatory Visit: Payer: Self-pay

## 2022-09-16 ENCOUNTER — Ambulatory Visit (INDEPENDENT_AMBULATORY_CARE_PROVIDER_SITE_OTHER): Payer: BC Managed Care – PPO | Admitting: Physician Assistant

## 2022-09-16 ENCOUNTER — Telehealth: Payer: Self-pay

## 2022-09-16 VITALS — HR 85 | Temp 98.6°F | Resp 20 | Ht 63.0 in | Wt 198.5 lb

## 2022-09-16 DIAGNOSIS — T8189XD Other complications of procedures, not elsewhere classified, subsequent encounter: Secondary | ICD-10-CM

## 2022-09-16 DIAGNOSIS — T827XXA Infection and inflammatory reaction due to other cardiac and vascular devices, implants and grafts, initial encounter: Secondary | ICD-10-CM

## 2022-09-16 NOTE — Telephone Encounter (Signed)
Pt called stating that her incision is "opening up".  Reviewed pt's chart, returned call for clarification, two identifiers used. Pt stated that there is approx 1/2" opening on the incision on her L arm. The area is swollen, hardened, red, painful, malodorous drainage that was white and has changed to serosanguinous. She denies any red streaks or fever. Appt scheduled to r/o infection. Confirmed understanding.

## 2022-09-16 NOTE — Progress Notes (Signed)
Office Note     CC:  follow up Requesting Provider:  Glendale Chard, MD  HPI: Meagan [name] is a 61 y.o. (1961/07/26) female who presents status post left carotid endarterectomy with bovine patch angioplasty and redo left common carotid artery to brachial artery bypass using PTFE by Dr. 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  She was seen in office on 09/02/2022 with a possible fluid collection around the brachial artery incision however no skin changes or incision issues.  She was added as a triage visit today due to a more well-defined fluid collection at the brachial artery incision with some dehiscence and drainage.  She denies any fevers, chills, nausea/vomiting.  She states her third fingertip continues to be healing well.  She says the skin changes around her brachial artery incision started about 3 days ago.   Past Medical History:  Diagnosis Date   Arthritis    Chronic kidney disease    Complication of anesthesia    Diabetes mellitus (HCC)    GERD (gastroesophageal reflux disease)    Hyperlipidemia    Hypertension    Neuropathy    Pneumonia    PONV (postoperative nausea and vomiting)     Past Surgical History:  Procedure Laterality Date   AORTIC ARCH ANGIOGRAPHY N/A 08/01/2021   Procedure: AORTIC ARCH ANGIOGRAPHY;  Surgeon: Marty Heck, MD;  Location: [place] [facility];  Service: Cardiovascular;  Laterality: N/A;   AORTIC ARCH ANGIOGRAPHY N/A 08/07/2022   Procedure: AORTIC ARCH ANGIOGRAPHY;  Surgeon: Marty Heck, MD;  Location: [place] [facility];  Service: Cardiovascular;  Laterality: N/A;   CAROTID-SUBCLAVIAN BYPASS GRAFT Left 08/12/2021   Procedure: LEFT CAROTID-BRACHIAL ARTERY BYPASS using Left greater saphenous Vein, harvested from left leg.;  Surgeon: Marty Heck, MD;  Location: [facility] OR;  Service: Vascular;  Laterality: Left;    CAROTID-SUBCLAVIAN BYPASS GRAFT Left 08/13/2022   Procedure: REDO LEFT COMMON CAROTID-BRACHIAL ARTERY BYPASS USING PROPATEN GRAFT.;  Surgeon: Marty Heck, MD;  Location: [place];  Service: Vascular;  Laterality: Left;   CARPAL TUNNEL RELEASE Left 01/31/2021   CARPAL TUNNEL RELEASE Right 2021   Dr. Mardelle Matte   DILATION AND CURETTAGE OF UTERUS  2007   ENDARTERECTOMY Left 08/13/2022   Procedure: ENDARTERECTOMY CAROTID WITH BOVINE PATCH.;  Surgeon: Marty Heck, MD;  Location: [place];  Service: Vascular;  Laterality: Left;   ROTATOR CUFF REPAIR Right 2018   Dr. Mardelle Matte   TUBAL LIGATION  1992   UPPER EXTREMITY ANGIOGRAPHY Left 08/01/2021   Procedure: UPPER EXTREMITY ANGIOGRAPHY;  Surgeon: Marty Heck, MD;  Location: [place] [facility];  Service: Cardiovascular;  Laterality: Left;   UPPER EXTREMITY ANGIOGRAPHY Left 08/07/2022   Procedure: Upper Extremity Angiography;  Surgeon: Marty Heck, MD;  Location: [place] [facility];  Service: Cardiovascular;  Laterality: Left;    Social History   Socioeconomic History   Marital status: Single    Spouse name: Not on file   Number of children: Not on file   Years of education: Not on file   Highest education level: Not on file  Occupational History   Not on file  Tobacco Use   Smoking status: Never   Smokeless tobacco: Never  Vaping Use   Vaping Use: Never used  Substance and Sexual Activity   Alcohol use: Yes    Comment: occasional   Drug use: Never  Sexual activity: Not on file  Other Topics Concern   Not on file  Social History Narrative   Not on file   Social Determinants of Health   Financial Resource Strain: Not on file  Food Insecurity: Not on file  Transportation Needs: Not on file  Physical Activity: Not on file  Stress: Not on file  Social Connections: Not on file  Intimate Partner Violence: Not on file    Family History  Problem Relation Age of Onset   Kidney failure Mother    Liver disease  Mother    Early death Father    Hypertension Brother    Colon cancer Neg Hx    Stomach cancer Neg Hx     Current Outpatient Medications  Medication Sig Dispense Refill   amLODipine (NORVASC) 5 MG tablet TAKE 1 TABLET BY MOUTH EVERY DAY 90 tablet 1   aspirin EC 81 MG tablet Take 1 tablet (81 mg total) by mouth daily. Swallow whole. (Patient taking differently: Take 81 mg by mouth daily. Swallow whole.  Takes at hs) 150 tablet 2   atorvastatin (LIPITOR) 80 MG tablet Take 0.5 tablets (40 mg total) by mouth daily. (Patient taking differently: Take 80 mg by mouth daily. Takes at hs) 90 tablet 1   Blood Glucose Monitoring Suppl ([place]) w/Device KIT Use as directed to check blood sugars 2 times per day dx: e11.65 1 kit 1   Cholecalciferol (DIALYVITE VITAMIN D 5000 PO) Take 10,000 Units by mouth in the morning.     diclofenac Sodium (VOLTAREN) 1 % GEL Use as directed, apply to affected joints tid prn (Patient taking differently: Apply 1 Application topically 3 (three) times daily as needed (joint pain). Use as directed, apply to affected joints tid prn) 150 g 2   empagliflozin (JARDIANCE) 25 MG TABS tablet Take 1 tablet (25 mg total) by mouth daily before breakfast. 90 tablet 1   ferrous sulfate 325 (65 FE) MG tablet TAKE 1 TABLET BY MOUTH EVERY DAY WITH BREAKFAST (Patient taking differently: Take 325 mg by mouth daily with breakfast.) 90 tablet 1   gabapentin (NEURONTIN) 300 MG capsule TAKE 1 CAPSULE BY MOUTH 3 (THREE) TIMES DAILY. TAKE 2 CAPSULES TO EQUAL 600MG AT NIGHT 180 capsule 2   glucose blood (ONETOUCH VERIO) test strip USE TO CHECK BLOOD SUGAR TWICE A DAY 100 strip 12   insulin degludec (TRESIBA FLEXTOUCH) 200 UNIT/ML FlexTouch Pen Inject 70 Units into the skin every evening. 9 mL 43   Insulin Pen Needle (B-D ULTRAFINE III SHORT PEN) 31G X 8 MM MISC Use as directed with insulin pen 100 each 3   MAGNESIUM PO Take 1 tablet by mouth daily.     Multiple Vitamin (MULTIVITAMIN  WITH MINERALS) TABS tablet Take 1 tablet by mouth daily.     NOVOLOG FLEXPEN 100 UNIT/ML FlexPen INJECT 12 UNITS 3 TIMES PER DAY WITH MEALS NOT TO EXCEED 50 UNITS 15 mL 1   Omega-3 Fatty Acids (FISH OIL PO) Take 1 capsule by mouth in the morning.     oxyCODONE-acetaminophen (PERCOCET/ROXICET) 5-325 MG tablet Take 1 tablet by mouth every 6 (six) hours as needed for moderate pain. 30 tablet 0   PRILOSEC OTC 20 MG tablet Take 20 mg by mouth daily.      Semaglutide, 2 MG/DOSE, (OZEMPIC, 2 MG/DOSE,) 8 MG/3ML SOPN Inject 2 mg into the skin once a week. (Patient taking differently: Inject 2 mg into the skin every Tuesday.) 3 mL 0  sertraline (ZOLOFT) 50 MG tablet TAKE 1 TABLET BY MOUTH EVERY DAY 90 tablet 0   Turmeric 500 MG CAPS Take 500 mg by mouth daily.     valACYclovir (VALTREX) 500 MG tablet TAKE 1 TABLET (500 MG TOTAL) BY MOUTH IN THE MORNING 90 tablet 1   No current facility-administered medications for this visit.    Allergies  Allergen Reactions   Promethazine Hcl Shortness Of Breath and Other (See Comments)    Bells palsy   Lisinopril Itching and Swelling     REVIEW OF SYSTEMS:   '[X]'  denotes positive finding, '[ ]'  denotes negative finding Cardiac  Comments:  Chest pain or chest pressure:    Shortness of breath upon exertion:    Short of breath when lying flat:    Irregular heart rhythm:        Vascular    Pain in calf, thigh, or hip brought on by ambulation:    Pain in feet at night that wakes you up from your sleep:     Blood clot in your veins:    Leg swelling:         Pulmonary    Oxygen at home:    Productive cough:     Wheezing:         Neurologic    Sudden weakness in arms or legs:     Sudden numbness in arms or legs:     Sudden onset of difficulty speaking or slurred speech:    Temporary loss of vision in one eye:     Problems with dizziness:         Gastrointestinal    Blood in stool:     Vomited blood:         Genitourinary    Burning when urinating:      Blood in urine:        Psychiatric    Major depression:         Hematologic    Bleeding problems:    Problems with blood clotting too easily:        Skin    Rashes or ulcers:        Constitutional    Fever or chills:      PHYSICAL EXAMINATION:  Vitals:   09/16/22 1433  Pulse: 85  Resp: 20  Temp: 98.6 F (37 C)  TempSrc: Temporal  SpO2: 98%  Weight: 198 lb 8 oz (90 kg)  Height: '5\' 3"'  (1.6 m)    General:  WDWN in NAD; vital signs documented above Gait: Not observed HENT: WNL, normocephalic Pulmonary: normal non-labored breathing , without Rales, rhonchi,  wheezing Cardiac: regular HR Abdomen: soft, NT, no masses Skin: without rashes Vascular Exam/Pulses: 2+ palpable left radial pulse Extremities: Left neck incision well-healed; left brachial artery incision with some dehiscence with minimal thin drainage with surrounding erythema and well defined fluid collection Musculoskeletal: no muscle wasting or atrophy  Neurologic: A&O X 3;  No focal weakness or paresthesias are detected Psychiatric:  The pt has Normal affect.   ASSESSMENT/PLAN:: 61 y.o. female here for evaluation of worsening skin changes around her left brachial artery incision  -Left hand is well-perfused with a 2+ palpable left radial pulse.  The third fingertip continues to improve and has a small gangrenous necrotic tip that will likely auto amputate.  Patient however is having skin changes around the incision of her left brachial artery.  The area is warm and red with some superficial dehiscence.  Dr. Carlis Abbott was involved  in the evaluation and management plan today.  He image the graft with ultrasound which demonstrated fluid surrounding the graft all the way to the level of the shoulder.  Given these findings as well as the skin changes she was posted for incision and drainage of left brachial incision tomorrow in the operating room.  We will obtain cultures in the operating room.  She will likely have  wound VAC placed.  She was also posted for possible graft excision however would only perform graft excision if findings were impressive and purulent in nature.  All questions were answered and she is agreeable to proceed tomorrow.  She will be n.p.o. after midnight.   Dagoberto Ligas, PA-C [facility] [phone]  Clinic MD:   Carlis Abbott

## 2022-09-16 NOTE — Progress Notes (Signed)
PCP - Dr Glendale Chard Cardiologist - Dr Carlis Abbott  Chest x-ray - n/a EKG - 08/13/22 Stress Test - n/a ECHO - n/a Cardiac Cath - n/a  ICD Pacemaker/Loop - none Sleep Study -  n/a  CPAP - none  Do not take Jardiance tonight.  THE NIGHT BEFORE SURGERY AND THE DAY OF SURGERY, do not take Tresiba Insulin.   If your blood sugar is less than 70 mg/dL, you will need to treat for low blood sugar: Do not take insulin. Treat a low blood sugar (less than 70 mg/dL) with  cup of clear juice (cranberry or apple), 4 glucose tablets, OR glucose gel. Recheck blood sugar in 15 minutes after treatment (to make sure it is greater than 70 mg/dL). If your blood sugar is not greater than 70 mg/dL on recheck, call [phone] for further instructions.  STOP now taking any Aspirin (unless otherwise instructed by your surgeon), Aleve, Naproxen, Ibuprofen, Motrin, Advil, Goody's, BC's, all herbal medications, fish oil, and all vitamins.   Anesthesia - Yes, Dr Fransisco Beau  Coronavirus Screening Do you have any of the following symptoms:  Cough yes/no: No Fever (>100.51F)  yes/no: No Runny nose yes/no: No Sore throat yes/no: No Difficulty breathing/shortness of breath  yes/no: No  Have you traveled in the last 14 days and where? [place]  Patient verbalized understanding of instructions that were given via phone.

## 2022-09-17 ENCOUNTER — Inpatient Hospital Stay (HOSPITAL_COMMUNITY): Payer: BC Managed Care – PPO | Admitting: Anesthesiology

## 2022-09-17 ENCOUNTER — Other Ambulatory Visit: Payer: Self-pay

## 2022-09-17 ENCOUNTER — Encounter (HOSPITAL_COMMUNITY)
Admission: RE | Disposition: A | Discharge status: Home / Self Care | Payer: Self-pay | Source: Home / Self Care | Attending: Vascular Surgery

## 2022-09-17 ENCOUNTER — Inpatient Hospital Stay (HOSPITAL_COMMUNITY)
Admission: RE | Admit: 2022-09-17 | Discharge: 2022-09-20 | DRG: 920 | Disposition: A | Discharge status: Home / Self Care | Payer: BC Managed Care – PPO | Attending: Vascular Surgery | Admitting: Vascular Surgery

## 2022-09-17 ENCOUNTER — Encounter (HOSPITAL_COMMUNITY): Payer: Self-pay | Admitting: Vascular Surgery

## 2022-09-17 DIAGNOSIS — E785 Hyperlipidemia, unspecified: Secondary | ICD-10-CM | POA: Diagnosis present

## 2022-09-17 DIAGNOSIS — Z794 Long term (current) use of insulin: Secondary | ICD-10-CM | POA: Diagnosis not present

## 2022-09-17 DIAGNOSIS — I97622 Postprocedural seroma of a circulatory system organ or structure following other procedure: Secondary | ICD-10-CM | POA: Diagnosis present

## 2022-09-17 DIAGNOSIS — Z841 Family history of disorders of kidney and ureter: Secondary | ICD-10-CM | POA: Diagnosis not present

## 2022-09-17 DIAGNOSIS — T8189XA Other complications of procedures, not elsewhere classified, initial encounter: Secondary | ICD-10-CM | POA: Diagnosis present

## 2022-09-17 DIAGNOSIS — I129 Hypertensive chronic kidney disease with stage 1 through stage 4 chronic kidney disease, or unspecified chronic kidney disease: Secondary | ICD-10-CM | POA: Diagnosis present

## 2022-09-17 DIAGNOSIS — Z888 Allergy status to other drugs, medicaments and biological substances status: Secondary | ICD-10-CM

## 2022-09-17 DIAGNOSIS — T827XXA Infection and inflammatory reaction due to other cardiac and vascular devices, implants and grafts, initial encounter: Principal | ICD-10-CM

## 2022-09-17 DIAGNOSIS — Z7982 Long term (current) use of aspirin: Secondary | ICD-10-CM | POA: Diagnosis not present

## 2022-09-17 DIAGNOSIS — T8131XA Disruption of external operation (surgical) wound, not elsewhere classified, initial encounter: Secondary | ICD-10-CM | POA: Diagnosis present

## 2022-09-17 DIAGNOSIS — K219 Gastro-esophageal reflux disease without esophagitis: Secondary | ICD-10-CM | POA: Diagnosis present

## 2022-09-17 DIAGNOSIS — N182 Chronic kidney disease, stage 2 (mild): Secondary | ICD-10-CM | POA: Diagnosis present

## 2022-09-17 DIAGNOSIS — Z8249 Family history of ischemic heart disease and other diseases of the circulatory system: Secondary | ICD-10-CM | POA: Diagnosis not present

## 2022-09-17 DIAGNOSIS — E1152 Type 2 diabetes mellitus with diabetic peripheral angiopathy with gangrene: Secondary | ICD-10-CM | POA: Diagnosis present

## 2022-09-17 DIAGNOSIS — Z79899 Other long term (current) drug therapy: Secondary | ICD-10-CM | POA: Diagnosis not present

## 2022-09-17 DIAGNOSIS — E1122 Type 2 diabetes mellitus with diabetic chronic kidney disease: Secondary | ICD-10-CM | POA: Diagnosis present

## 2022-09-17 DIAGNOSIS — Y838 Other surgical procedures as the cause of abnormal reaction of the patient, or of later complication, without mention of misadventure at the time of the procedure: Secondary | ICD-10-CM | POA: Diagnosis present

## 2022-09-17 HISTORY — PX: INCISION AND DRAINAGE: SHX5863

## 2022-09-17 LAB — POCT I-STAT, CHEM 8
BUN: 26 mg/dL — ABNORMAL HIGH (ref 8–23)
Calcium, Ion: 1.11 mmol/L — ABNORMAL LOW (ref 1.15–1.40)
Chloride: 108 mmol/L (ref 98–111)
Creatinine, Ser: 1.1 mg/dL — ABNORMAL HIGH (ref 0.44–1.00)
Glucose, Bld: 205 mg/dL — ABNORMAL HIGH (ref 70–99)
HCT: 40 % (ref 36.0–46.0)
Hemoglobin: 13.6 g/dL (ref 12.0–15.0)
Potassium: 4.5 mmol/L (ref 3.5–5.1)
Sodium: 141 mmol/L (ref 135–145)
TCO2: 22 mmol/L (ref 22–32)

## 2022-09-17 LAB — GLUCOSE, CAPILLARY
Glucose-Capillary: 190 mg/dL — ABNORMAL HIGH (ref 70–99)
Glucose-Capillary: 148 mg/dL — ABNORMAL HIGH (ref 70–99)
Glucose-Capillary: 125 mg/dL — ABNORMAL HIGH (ref 70–99)

## 2022-09-17 SURGERY — INCISION AND DRAINAGE
Anesthesia: General | Site: Arm Upper | Laterality: Left

## 2022-09-17 MED ORDER — OXYCODONE HCL 5 MG/5ML PO SOLN: 5.0000 mg | Freq: Once | ORAL | Status: DC | PRN

## 2022-09-17 MED ORDER — VALACYCLOVIR HCL 500 MG PO TABS
500.0000 mg | ORAL_TABLET | Freq: Every morning | ORAL | Status: DC
  Administered 2022-09-18 – 2022-09-20 (×3): 500 mg via ORAL
  Filled 2022-09-17 (×3): qty 1

## 2022-09-17 MED ORDER — SODIUM CHLORIDE 0.9 % IV SOLN: INTRAVENOUS | Status: AC

## 2022-09-17 MED ORDER — VANCOMYCIN HCL 1000 MG IV SOLR
INTRAVENOUS | Status: DC | PRN
  Administered 2022-09-17: 1000 mg via TOPICAL

## 2022-09-17 MED ORDER — CHLORHEXIDINE GLUCONATE 4 % EX LIQD: 60.0000 mL | Freq: Once | CUTANEOUS | Status: DC

## 2022-09-17 MED ORDER — SCOPOLAMINE 1 MG/3DAYS TD PT72
1.0000 | MEDICATED_PATCH | TRANSDERMAL | Status: DC
  Administered 2022-09-17: 1.5 mg via TRANSDERMAL
  Filled 2022-09-17: qty 1

## 2022-09-17 MED ORDER — INSULIN ASPART 100 UNIT/ML IJ SOLN
0.0000 [IU] | INTRAMUSCULAR | Status: AC | PRN
  Administered 2022-09-17: 2 [IU] via SUBCUTANEOUS
  Administered 2022-09-17: 4 [IU] via SUBCUTANEOUS
  Filled 2022-09-17: qty 1

## 2022-09-17 MED ORDER — PHENYLEPHRINE HCL (PRESSORS) 10 MG/ML IV SOLN
INTRAVENOUS | Status: DC | PRN
  Administered 2022-09-17 (×2): 160 ug via INTRAVENOUS
  Administered 2022-09-17: 40 ug via INTRAVENOUS
  Administered 2022-09-17: 160 ug via INTRAVENOUS
  Administered 2022-09-17: 40 ug via INTRAVENOUS

## 2022-09-17 MED ORDER — FENTANYL CITRATE (PF) 100 MCG/2ML IJ SOLN
25.0000 ug | INTRAMUSCULAR | Status: DC | PRN
  Administered 2022-09-17 (×3): 50 ug via INTRAVENOUS

## 2022-09-17 MED ORDER — VANCOMYCIN HCL 1000 MG IV SOLR
INTRAVENOUS | Status: AC
  Filled 2022-09-17: qty 20

## 2022-09-17 MED ORDER — PANTOPRAZOLE SODIUM 40 MG PO TBEC
40.0000 mg | DELAYED_RELEASE_TABLET | Freq: Every day | ORAL | Status: DC
  Administered 2022-09-18 – 2022-09-20 (×3): 40 mg via ORAL
  Filled 2022-09-17 (×3): qty 1

## 2022-09-17 MED ORDER — ONDANSETRON HCL 4 MG/2ML IJ SOLN
INTRAMUSCULAR | Status: DC | PRN
  Administered 2022-09-17: 4 mg via INTRAVENOUS

## 2022-09-17 MED ORDER — ACETAMINOPHEN 325 MG PO TABS: 325.0000 mg | ORAL_TABLET | ORAL | Status: DC | PRN

## 2022-09-17 MED ORDER — SERTRALINE HCL 50 MG PO TABS
50.0000 mg | ORAL_TABLET | Freq: Every day | ORAL | Status: DC
  Administered 2022-09-18 – 2022-09-20 (×3): 50 mg via ORAL
  Filled 2022-09-17 (×3): qty 1

## 2022-09-17 MED ORDER — ONDANSETRON HCL 4 MG/2ML IJ SOLN: 4.0000 mg | Freq: Four times a day (QID) | INTRAMUSCULAR | Status: DC | PRN

## 2022-09-17 MED ORDER — 0.9 % SODIUM CHLORIDE (POUR BTL) OPTIME
TOPICAL | Status: DC | PRN
  Administered 2022-09-17: 1000 mL

## 2022-09-17 MED ORDER — MEPERIDINE HCL 25 MG/ML IJ SOLN: 6.2500 mg | INTRAMUSCULAR | Status: DC | PRN

## 2022-09-17 MED ORDER — DEXAMETHASONE SODIUM PHOSPHATE 10 MG/ML IJ SOLN
INTRAMUSCULAR | Status: DC | PRN
  Administered 2022-09-17: 4 mg via INTRAVENOUS

## 2022-09-17 MED ORDER — MAGNESIUM SULFATE 2 GM/50ML IV SOLN: 2.0000 g | Freq: Every day | INTRAVENOUS | Status: DC | PRN

## 2022-09-17 MED ORDER — PHENYLEPHRINE 80 MCG/ML (10ML) SYRINGE FOR IV PUSH (FOR BLOOD PRESSURE SUPPORT)
PREFILLED_SYRINGE | INTRAVENOUS | Status: AC
  Filled 2022-09-17: qty 10

## 2022-09-17 MED ORDER — INSULIN ASPART 100 UNIT/ML IJ SOLN
0.0000 [IU] | Freq: Three times a day (TID) | INTRAMUSCULAR | Status: DC
  Administered 2022-09-18: 15 [IU] via SUBCUTANEOUS
  Administered 2022-09-18: 8 [IU] via SUBCUTANEOUS
  Administered 2022-09-18: 11 [IU] via SUBCUTANEOUS
  Administered 2022-09-19 (×2): 3 [IU] via SUBCUTANEOUS
  Administered 2022-09-19: 5 [IU] via SUBCUTANEOUS
  Administered 2022-09-20: 2 [IU] via SUBCUTANEOUS
  Administered 2022-09-20: 3 [IU] via SUBCUTANEOUS

## 2022-09-17 MED ORDER — OXYCODONE-ACETAMINOPHEN 5-325 MG PO TABS
1.0000 | ORAL_TABLET | ORAL | Status: DC | PRN
  Administered 2022-09-17 – 2022-09-20 (×6): 2 via ORAL
  Filled 2022-09-17 (×6): qty 2

## 2022-09-17 MED ORDER — PIPERACILLIN-TAZOBACTAM 3.375 G IVPB
3.3750 g | Freq: Three times a day (TID) | INTRAVENOUS | Status: DC
  Administered 2022-09-17 – 2022-09-20 (×9): 3.375 g via INTRAVENOUS
  Filled 2022-09-17 (×9): qty 50

## 2022-09-17 MED ORDER — FENTANYL CITRATE (PF) 100 MCG/2ML IJ SOLN
INTRAMUSCULAR | Status: AC
  Filled 2022-09-17: qty 2

## 2022-09-17 MED ORDER — ALBUMIN HUMAN 5 % IV SOLN: INTRAVENOUS | Status: DC | PRN

## 2022-09-17 MED ORDER — PROPOFOL 10 MG/ML IV BOLUS
INTRAVENOUS | Status: AC
  Filled 2022-09-17: qty 20

## 2022-09-17 MED ORDER — TOBRAMYCIN SULFATE 80 MG/2ML IJ SOLN
INTRAMUSCULAR | Status: AC
  Filled 2022-09-17: qty 6

## 2022-09-17 MED ORDER — ALUM & MAG HYDROXIDE-SIMETH 200-200-20 MG/5ML PO SUSP: 15.0000 mL | ORAL | Status: DC | PRN

## 2022-09-17 MED ORDER — PHENYLEPHRINE HCL (PRESSORS) 10 MG/ML IV SOLN
INTRAVENOUS | Status: AC
  Filled 2022-09-17: qty 1

## 2022-09-17 MED ORDER — VANCOMYCIN HCL IN DEXTROSE 1-5 GM/200ML-% IV SOLN
1000.0000 mg | INTRAVENOUS | Status: DC
  Administered 2022-09-18 – 2022-09-20 (×3): 1000 mg via INTRAVENOUS
  Filled 2022-09-17 (×3): qty 200

## 2022-09-17 MED ORDER — GENTAMICIN SULFATE 40 MG/ML IJ SOLN
INTRAMUSCULAR | Status: AC
  Filled 2022-09-17: qty 6

## 2022-09-17 MED ORDER — PHENYLEPHRINE HCL-NACL 20-0.9 MG/250ML-% IV SOLN
INTRAVENOUS | Status: DC | PRN
  Administered 2022-09-17: 25 ug/min via INTRAVENOUS

## 2022-09-17 MED ORDER — ONDANSETRON HCL 4 MG/2ML IJ SOLN
INTRAMUSCULAR | Status: AC
  Filled 2022-09-17: qty 2

## 2022-09-17 MED ORDER — HYDRALAZINE HCL 20 MG/ML IJ SOLN: 5.0000 mg | INTRAMUSCULAR | Status: DC | PRN

## 2022-09-17 MED ORDER — ROCURONIUM BROMIDE 10 MG/ML (PF) SYRINGE
PREFILLED_SYRINGE | INTRAVENOUS | Status: DC | PRN
  Administered 2022-09-17: 70 mg via INTRAVENOUS

## 2022-09-17 MED ORDER — EPHEDRINE SULFATE-NACL 50-0.9 MG/10ML-% IV SOSY
PREFILLED_SYRINGE | INTRAVENOUS | Status: DC | PRN
  Administered 2022-09-17: 5 mg via INTRAVENOUS

## 2022-09-17 MED ORDER — SODIUM CHLORIDE 0.9 % IV SOLN
500.0000 mL | Freq: Once | INTRAVENOUS | Status: AC | PRN
  Administered 2022-09-18: 500 mL via INTRAVENOUS

## 2022-09-17 MED ORDER — EPHEDRINE 5 MG/ML INJ
INTRAVENOUS | Status: AC
  Filled 2022-09-17: qty 5

## 2022-09-17 MED ORDER — GENTAMICIN SULFATE 40 MG/ML IJ SOLN
INTRAMUSCULAR | Status: DC | PRN
  Administered 2022-09-17: 120 mg

## 2022-09-17 MED ORDER — OMEPRAZOLE MAGNESIUM 20 MG PO TBEC: 20.0000 mg | DELAYED_RELEASE_TABLET | Freq: Every day | ORAL | Status: DC

## 2022-09-17 MED ORDER — ACETAMINOPHEN 160 MG/5ML PO SOLN: 325.0000 mg | ORAL | Status: DC | PRN

## 2022-09-17 MED ORDER — LIDOCAINE 2% (20 MG/ML) 5 ML SYRINGE
INTRAMUSCULAR | Status: AC
  Filled 2022-09-17: qty 5

## 2022-09-17 MED ORDER — CEFAZOLIN SODIUM-DEXTROSE 2-4 GM/100ML-% IV SOLN
2.0000 g | INTRAVENOUS | Status: AC
  Administered 2022-09-17: 2 g via INTRAVENOUS
  Filled 2022-09-17: qty 100

## 2022-09-17 MED ORDER — SODIUM CHLORIDE 0.9 % IV SOLN: INTRAVENOUS | Status: DC

## 2022-09-17 MED ORDER — ATORVASTATIN CALCIUM 40 MG PO TABS
40.0000 mg | ORAL_TABLET | Freq: Every day | ORAL | Status: DC
  Administered 2022-09-18 – 2022-09-20 (×3): 40 mg via ORAL
  Filled 2022-09-17 (×3): qty 1

## 2022-09-17 MED ORDER — DOCUSATE SODIUM 100 MG PO CAPS
100.0000 mg | ORAL_CAPSULE | Freq: Every day | ORAL | Status: DC
  Administered 2022-09-18 – 2022-09-19 (×2): 100 mg via ORAL
  Filled 2022-09-17 (×2): qty 1

## 2022-09-17 MED ORDER — GABAPENTIN 300 MG PO CAPS
300.0000 mg | ORAL_CAPSULE | Freq: Three times a day (TID) | ORAL | Status: DC
  Administered 2022-09-17 – 2022-09-20 (×8): 300 mg via ORAL
  Filled 2022-09-17 (×8): qty 1

## 2022-09-17 MED ORDER — DEXAMETHASONE SODIUM PHOSPHATE 10 MG/ML IJ SOLN
INTRAMUSCULAR | Status: AC
  Filled 2022-09-17: qty 1

## 2022-09-17 MED ORDER — LACTATED RINGERS IV SOLN: INTRAVENOUS | Status: DC

## 2022-09-17 MED ORDER — ONDANSETRON HCL 4 MG/2ML IJ SOLN: 4.0000 mg | Freq: Once | INTRAMUSCULAR | Status: DC | PRN

## 2022-09-17 MED ORDER — FENTANYL CITRATE (PF) 250 MCG/5ML IJ SOLN
INTRAMUSCULAR | Status: DC | PRN
  Administered 2022-09-17 (×4): 50 ug via INTRAVENOUS

## 2022-09-17 MED ORDER — OXYCODONE HCL 5 MG PO TABS: 5.0000 mg | ORAL_TABLET | Freq: Once | ORAL | Status: DC | PRN

## 2022-09-17 MED ORDER — ORAL CARE MOUTH RINSE: 15.0000 mL | Freq: Once | OROMUCOSAL | Status: AC

## 2022-09-17 MED ORDER — LABETALOL HCL 5 MG/ML IV SOLN: 10.0000 mg | INTRAVENOUS | Status: DC | PRN

## 2022-09-17 MED ORDER — HEPARIN 6000 UNIT IRRIGATION SOLUTION
Status: AC
  Filled 2022-09-17: qty 500

## 2022-09-17 MED ORDER — PAPAVERINE HCL 30 MG/ML IJ SOLN
INTRAMUSCULAR | Status: AC
  Filled 2022-09-17: qty 2

## 2022-09-17 MED ORDER — PROTAMINE SULFATE 10 MG/ML IV SOLN
INTRAVENOUS | Status: AC
  Filled 2022-09-17: qty 5

## 2022-09-17 MED ORDER — ROCURONIUM BROMIDE 10 MG/ML (PF) SYRINGE
PREFILLED_SYRINGE | INTRAVENOUS | Status: AC
  Filled 2022-09-17: qty 10

## 2022-09-17 MED ORDER — AMLODIPINE BESYLATE 5 MG PO TABS
5.0000 mg | ORAL_TABLET | Freq: Every day | ORAL | Status: DC
  Administered 2022-09-19 – 2022-09-20 (×2): 5 mg via ORAL
  Filled 2022-09-17 (×3): qty 1

## 2022-09-17 MED ORDER — ACETAMINOPHEN 650 MG RE SUPP: 325.0000 mg | RECTAL | Status: DC | PRN

## 2022-09-17 MED ORDER — LIDOCAINE 2% (20 MG/ML) 5 ML SYRINGE
INTRAMUSCULAR | Status: DC | PRN
  Administered 2022-09-17: 60 mg via INTRAVENOUS

## 2022-09-17 MED ORDER — GUAIFENESIN-DM 100-10 MG/5ML PO SYRP: 15.0000 mL | ORAL_SOLUTION | ORAL | Status: DC | PRN

## 2022-09-17 MED ORDER — METOPROLOL TARTRATE 5 MG/5ML IV SOLN: 2.0000 mg | INTRAVENOUS | Status: DC | PRN

## 2022-09-17 MED ORDER — POTASSIUM CHLORIDE CRYS ER 20 MEQ PO TBCR: 20.0000 meq | EXTENDED_RELEASE_TABLET | Freq: Every day | ORAL | Status: DC | PRN

## 2022-09-17 MED ORDER — MORPHINE SULFATE (PF) 2 MG/ML IV SOLN: 2.0000 mg | INTRAVENOUS | Status: DC | PRN

## 2022-09-17 MED ORDER — ASPIRIN 81 MG PO TBEC
81.0000 mg | DELAYED_RELEASE_TABLET | Freq: Every day | ORAL | Status: DC
  Administered 2022-09-18 – 2022-09-20 (×3): 81 mg via ORAL
  Filled 2022-09-17 (×3): qty 1

## 2022-09-17 MED ORDER — PHENOL 1.4 % MT LIQD: 1.0000 | OROMUCOSAL | Status: DC | PRN

## 2022-09-17 MED ORDER — FENTANYL CITRATE (PF) 250 MCG/5ML IJ SOLN
INTRAMUSCULAR | Status: AC
  Filled 2022-09-17: qty 5

## 2022-09-17 MED ORDER — CHLORHEXIDINE GLUCONATE 0.12 % MT SOLN
15.0000 mL | Freq: Once | OROMUCOSAL | Status: AC
  Administered 2022-09-17: 15 mL via OROMUCOSAL
  Filled 2022-09-17: qty 15

## 2022-09-17 MED ORDER — PROPOFOL 10 MG/ML IV BOLUS
INTRAVENOUS | Status: DC | PRN
  Administered 2022-09-17: 100 mg via INTRAVENOUS

## 2022-09-17 SURGICAL SUPPLY — 69 items
ADH SKN CLS APL DERMABOND .7 (GAUZE/BANDAGES/DRESSINGS)
BANDAGE ESMARK 6X9 LF (GAUZE/BANDAGES/DRESSINGS) IMPLANT
BNDG CMPR 9X6 STRL LF SNTH (GAUZE/BANDAGES/DRESSINGS)
BNDG ELASTIC 4X5.8 VLCR STR LF (GAUZE/BANDAGES/DRESSINGS) ×1 IMPLANT
BNDG ESMARK 6X9 LF (GAUZE/BANDAGES/DRESSINGS)
BNDG GAUZE DERMACEA FLUFF 4 (GAUZE/BANDAGES/DRESSINGS) ×1 IMPLANT
BNDG GZE DERMACEA 4 6PLY (GAUZE/BANDAGES/DRESSINGS) ×2
CANISTER SUCT 1200ML W/VALVE (MISCELLANEOUS) ×3 IMPLANT
CANNULA VESSEL 3MM 2 BLNT TIP (CANNULA) IMPLANT
CLIP VESOCCLUDE MED 24/CT (CLIP) ×3 IMPLANT
CLIP VESOCCLUDE SM WIDE 24/CT (CLIP) ×3 IMPLANT
COVER PROBE W GEL 5X96 (DRAPES) ×1 IMPLANT
CUFF TOURN SGL QUICK 24 (TOURNIQUET CUFF)
CUFF TOURN SGL QUICK 34 (TOURNIQUET CUFF)
CUFF TOURN SGL QUICK 42 (TOURNIQUET CUFF) IMPLANT
CUFF TRNQT CYL 24X4X16.5-23 (TOURNIQUET CUFF) IMPLANT
CUFF TRNQT CYL 34X4.125X (TOURNIQUET CUFF) IMPLANT
DERMABOND ADVANCED .7 DNX12 (GAUZE/BANDAGES/DRESSINGS) ×2 IMPLANT
DRAIN CHANNEL 15F RND FF W/TCR (WOUND CARE) IMPLANT
DRAPE X-RAY CASS 24X20 (DRAPES) IMPLANT
DRESSING PEEL AND PLC PRVNA 13 (GAUZE/BANDAGES/DRESSINGS) ×1 IMPLANT
DRSG PEEL AND PLACE PREVENA 13 (GAUZE/BANDAGES/DRESSINGS) ×2
ELECT REM PT RETURN 9FT ADLT (ELECTROSURGICAL) ×2
ELECTRODE REM PT RTRN 9FT ADLT (ELECTROSURGICAL) ×3 IMPLANT
EVACUATOR SILICONE 100CC (DRAIN) IMPLANT
GLOVE BIOGEL PI IND STRL 7.5 (GLOVE) ×3 IMPLANT
GLOVE SURG SS PI 7.5 STRL IVOR (GLOVE) ×3 IMPLANT
GOWN STRL REUS W/ TWL LRG LVL3 (GOWN DISPOSABLE) ×6 IMPLANT
GOWN STRL REUS W/ TWL XL LVL3 (GOWN DISPOSABLE) ×3 IMPLANT
GOWN STRL REUS W/TWL LRG LVL3 (GOWN DISPOSABLE) ×4
GOWN STRL REUS W/TWL XL LVL3 (GOWN DISPOSABLE) ×2
HEMOSTAT SNOW SURGICEL 2X4 (HEMOSTASIS) IMPLANT
INSERT FOGARTY SM (MISCELLANEOUS) IMPLANT
KIT BASIN OR (CUSTOM PROCEDURE TRAY) ×3 IMPLANT
KIT DRSG PREVENA PLUS 7DAY 125 (MISCELLANEOUS) ×1 IMPLANT
KIT STIMULAN RAPID CURE 5CC (Orthopedic Implant) ×1 IMPLANT
KIT TURNOVER KIT B (KITS) ×3 IMPLANT
MARKER GRAFT CORONARY BYPASS (MISCELLANEOUS) IMPLANT
NS IRRIG 1000ML POUR BTL (IV SOLUTION) ×6 IMPLANT
PACK CAROTID (CUSTOM PROCEDURE TRAY) ×1 IMPLANT
PACK PERIPHERAL VASCULAR (CUSTOM PROCEDURE TRAY) ×3 IMPLANT
PAD ARMBOARD 7.5X6 YLW CONV (MISCELLANEOUS) ×6 IMPLANT
POWDER MYRIAD MORCELLS 1000MG (Miscellaneous) ×2 IMPLANT
SET COLLECT BLD 21X3/4 12 (NEEDLE) IMPLANT
SHEET MEDIUM DRAPE 40X70 STRL (DRAPES) IMPLANT
STAPLER VISISTAT 35W (STAPLE) ×1 IMPLANT
STOPCOCK 4 WAY LG BORE MALE ST (IV SETS) IMPLANT
SUT ETHILON 3 0 PS 1 (SUTURE) IMPLANT
SUT GORETEX 6.0 TT13 (SUTURE) IMPLANT
SUT GORETEX 6.0 TT9 (SUTURE) IMPLANT
SUT PROLENE 5 0 C 1 24 (SUTURE) ×3 IMPLANT
SUT PROLENE 6 0 BV (SUTURE) ×3 IMPLANT
SUT PROLENE 7 0 BV 1 (SUTURE) IMPLANT
SUT SILK 2 0 SH (SUTURE) ×3 IMPLANT
SUT SILK 3 0 (SUTURE)
SUT SILK 3-0 18XBRD TIE 12 (SUTURE) IMPLANT
SUT VIC AB 2-0 CT1 27 (SUTURE) ×4
SUT VIC AB 2-0 CT1 TAPERPNT 27 (SUTURE) ×6 IMPLANT
SUT VIC AB 3-0 SH 27 (SUTURE) ×4
SUT VIC AB 3-0 SH 27X BRD (SUTURE) ×6 IMPLANT
SUT VICRYL 4-0 PS2 18IN ABS (SUTURE) ×6 IMPLANT
SYR BULB IRRIG 60ML STRL (SYRINGE) ×1 IMPLANT
TAPE UMBILICAL 1/8X30 (MISCELLANEOUS) ×2 IMPLANT
TAPE UMBILICAL COTTON 1/8X30 (MISCELLANEOUS) IMPLANT
TOWEL GREEN STERILE FF (TOWEL DISPOSABLE) ×3 IMPLANT
TRAY FOLEY MTR SLVR 16FR STAT (SET/KITS/TRAYS/PACK) ×3 IMPLANT
TUBING EXTENTION W/L.L. (IV SETS) IMPLANT
UNDERPAD 30X36 HEAVY ABSORB (UNDERPADS AND DIAPERS) ×3 IMPLANT
WATER STERILE IRR 1000ML POUR (IV SOLUTION) ×3 IMPLANT

## 2022-09-17 NOTE — Anesthesia Postprocedure Evaluation (Signed)
Anesthesia Post Note  Patient: Meagan [name]  Procedure(s) Performed: INCISION AND DRAINAGE LEFT ARM (Left: Arm Upper)     Patient location during evaluation: PACU Anesthesia Type: General Level of consciousness: awake and alert, oriented and patient cooperative Pain management: pain level controlled Vital Signs Assessment: post-procedure vital signs reviewed and stable Respiratory status: spontaneous breathing, nonlabored ventilation and respiratory function stable Cardiovascular status: blood pressure returned to baseline and stable Postop Assessment: no apparent nausea or vomiting Anesthetic complications: no     Last Vitals:  Vitals:   09/17/22 1715 09/17/22 1730  BP: 108/65 (!) 122/44  Pulse: 93 89  Resp: 18 18  Temp:    SpO2: 90% 91%    Last Pain:  Vitals:   09/17/22 1149  TempSrc:   PainSc: 0-No pain                 Jarome Matin Farren Nelles

## 2022-09-17 NOTE — Progress Notes (Signed)
Pharmacy Antibiotic Note  Meagan [name] is a 61 y.o. female admitted on 09/17/2022 with recent CEA now with concerns for infected wound. Pharmacy has been consulted for vancomycin and Zosyn dosing. SCr 1.1.  Plan: Vancomycin '1000mg'$  IV q24h - est AUC 544 Zosyn 3.375g IV EI q8h Follow Cr, LOT, Cx Vancomycin levels as needed  Height: '5\' 3"'$  (160 cm) Weight: 89.8 kg (198 lb) IBW/kg (Calculated) : 52.4  Temp (24hrs), Avg:98 F (36.7 C), Min:97.9 F (36.6 C), Max:98 F (36.7 C)  Recent Labs  Lab 09/17/22 1153  CREATININE 1.10*    Estimated Creatinine Clearance: 57.1 mL/min (A) (by C-G formula based on SCr of 1.1 mg/dL (H)).    Allergies  Allergen Reactions   Promethazine Hcl Shortness Of Breath and Other (See Comments)    Bells palsy   Lisinopril Itching and Swelling    Arrie Senate, PharmD, BCPS, [facility] Clinical Pharmacist [phone] Please check AMION for all [facility] Pharmacy numbers 09/17/2022

## 2022-09-17 NOTE — Anesthesia Preprocedure Evaluation (Addendum)
Anesthesia Evaluation  Patient identified by MRN, date of birth, ID band Patient awake    Reviewed: Allergy & Precautions, NPO status , Patient's Chart, lab work & pertinent test results  History of Anesthesia Complications (+) PONV and history of anesthetic complications  Airway Mallampati: II  TM Distance: >3 FB Neck ROM: Full    Dental  (+) Poor Dentition, Missing, Chipped, Dental Advisory Given,    Pulmonary    breath sounds clear to auscultation       Cardiovascular hypertension, Pt. on medications + Peripheral Vascular Disease   Rhythm:Regular Rate:Normal     Neuro/Psych negative neurological ROS  negative psych ROS   GI/Hepatic Neg liver ROS, GERD  Medicated,  Endo/Other  diabetes, Poorly Controlled, Type 2, Insulin Dependent, Oral Hypoglycemic AgentsFS 190  Renal/GU Renal InsufficiencyRenal diseaseCr 1.1  negative genitourinary   Musculoskeletal  (+) Arthritis , Osteoarthritis,    Abdominal (+) + obese,   Peds  Hematology   Anesthesia Other Findings   Reproductive/Obstetrics negative OB ROS                           Anesthesia Physical  Anesthesia Plan  ASA: 3  Anesthesia Plan: General   Post-op Pain Management:    Induction: Intravenous  PONV Risk Score and Plan: 4 or greater and Ondansetron, Dexamethasone, Midazolam and Scopolamine patch - Pre-op  Airway Management Planned: LMA  Additional Equipment: None  Intra-op Plan:   Post-operative Plan: Extubation in OR  Informed Consent: I have reviewed the patients History and Physical, chart, labs and discussed the procedure including the risks, benefits and alternatives for the proposed anesthesia with the patient or authorized representative who has indicated his/her understanding and acceptance.     Dental advisory given  Plan Discussed with: CRNA and Anesthesiologist  Anesthesia Plan Comments: (2 IV's)        Anesthesia Quick Evaluation

## 2022-09-17 NOTE — Op Note (Signed)
Date: September 17, 2022  Preoperative diagnosis: Concern for left arm infection after carotid to brachial artery bypass  Postoperative diagnosis: Left upper extremity seroma after left carotid to brachial artery bypass  Procedure: 1.  Irrigation and debridement left arm incision  2.  Placement of antibiotic beads in left arm wound (5 cc of stimulan beads with 120 mg Gentamicin and 1 g of Vancomycin) 3.  Placement of 2 g of myriad morcells skin substitute left arm wound 4.  Application of incisional VAC left arm incision  Surgeon: Dr. Marty Heck, MD  System: [place], [place]  Indications: 61 year old female who recently underwent a left carotid endarterectomy with a left carotid to brachial artery bypass for left upper extremity tissue loss on 08/13/22.  She had done well in recovery and then presented to the office on Tuesday with concern for wound infection over the brachial artery exposure.  She presents today for irrigation and debridement after risk benefits discussed.  Findings: The incision over the brachial artery exposure was reopened and appeared to be a large seroma cavity.  No purulence was encountered.  Cultures were sent intraoperative.  Subsequently placed 5 cc of stimulat beads with gentamicin and vancomycin in the wound bed and covering the graft as well as 2 g of myriad morcells to facilitate closure of the seroma cavity.  We closed some of the muscle/seroma cavity over the PTFE graft with 3-0 Vicryl sutures.  The arterial anastomosis was well incorporated.  I then closed the incision including the skin with staples and placed an incisional VAC.  Anesthesia: General  Details: Patient was taken to the operating room after informed consent was obtained.  Placed on the operative table in the supine position.  General endotracheal anesthesia was induced.  The left chest wall as the left arm were prepped and draped in standard sterile fashion.  Antibiotics were given.   Timeout performed.  Initially reopened the previous incision just above antecubital fossa over the brachial artery where the distal anastomosis was performed.  We encountered a large cavity of clear fluid that was then sent for cultures and Gram stain.  This appeared to be a seroma.  We then copiously irrigated this cavity with over 2 L of sterile saline.  I did not see any obvious purulence.  Ultimately we placed stimulan beads in the wound with gentamicin and vancomycin.  Also placed 2 g of myriad morcells.  I then used 3-0 Vicryl and approximated some of the muscle over the graft to get additional coverage.  The subcutaneous tissue was closed with 3-0 Vicryl and then we put staples in the skin.  An incisional VAC was applied with a Prevena.  Taken to recovery in stable condition.    Complication: None  Condition: Stable  Marty Heck, MD [facility] Office: [place]

## 2022-09-17 NOTE — Transfer of Care (Signed)
Immediate Anesthesia Transfer of Care Note  Patient: Meagan [name]  Procedure(s) Performed: INCISION AND DRAINAGE (Left) POSSIBLE GRAFT EXCISION WITH VEIN PATCH LEFT BRACHIAL ARTERY (Left)  Patient Location: PACU  Anesthesia Type:General  Level of Consciousness: awake, alert , oriented, patient cooperative and responds to stimulation  Airway & Oxygen Therapy: Patient Spontanous Breathing and Patient connected to nasal cannula oxygen  Post-op Assessment: Report given to RN and Post -op Vital signs reviewed and stable  Post vital signs: Reviewed and stable  Last Vitals:  Vitals Value Taken Time  BP 170/73 09/17/22 1700  Temp    Pulse 96 09/17/22 1700  Resp 18 09/17/22 1700  SpO2 95 % 09/17/22 1700  Vitals shown include unvalidated device data.  Last Pain:  Vitals:   09/17/22 1149  TempSrc:   PainSc: 0-No pain         Complications:  Encounter Notable Events  Notable Event Outcome Phase Comment  Difficult to intubate - expected  Intraprocedure Filed from anesthesia note documentation.

## 2022-09-17 NOTE — H&P (Signed)
History and Physical Interval Note:  09/17/2022 2:32 PM  Michaele Solow  has presented today for surgery, with the diagnosis of Infection of left arm bypass graft.  The various methods of treatment have been discussed with the patient and family. After consideration of risks, benefits and other options for treatment, the patient has consented to  Procedure(s): INCISION AND DRAINAGE (Left) POSSIBLE GRAFT EXCISION WITH VEIN PATCH LEFT BRACHIAL ARTERY (Left) as a surgical intervention.  The patient's history has been reviewed, patient examined, no change in status, stable for surgery.  I have reviewed the patient's chart and labs.  Questions were answered to the patient's satisfaction.    I&D left brachial incision.  Will try and salvage graft given limited options.    Marty Heck  Office Note        CC:  follow up Requesting Provider:  Glendale Chard, MD   HPI: Meagan [name] is a 61 y.o. (07/26/1961) female who presents status post left carotid endarterectomy with bovine patch angioplasty and redo left common carotid artery to brachial artery bypass using PTFE by Dr. 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  She was seen in office on 09/02/2022 with a possible fluid collection around the brachial artery incision however no skin changes or incision issues.  She was added as a triage visit today due to a more well-defined fluid collection at the brachial artery incision with some dehiscence and drainage.  She denies any fevers, chills, nausea/vomiting.  She states her third fingertip continues to be healing well.  She says the skin changes around her brachial artery incision started about 3 days ago.         Past Medical History:  Diagnosis Date   Arthritis     Chronic kidney disease     Complication of anesthesia     Diabetes mellitus (HCC)     GERD (gastroesophageal reflux  disease)     Hyperlipidemia     Hypertension     Neuropathy     Pneumonia     PONV (postoperative nausea and vomiting)             Past Surgical History:  Procedure Laterality Date   AORTIC ARCH ANGIOGRAPHY N/A 08/01/2021    Procedure: AORTIC ARCH ANGIOGRAPHY;  Surgeon: Marty Heck, MD;  Location: [place] [facility];  Service: Cardiovascular;  Laterality: N/A;   AORTIC ARCH ANGIOGRAPHY N/A 08/07/2022    Procedure: AORTIC ARCH ANGIOGRAPHY;  Surgeon: Marty Heck, MD;  Location: [place] [facility];  Service: Cardiovascular;  Laterality: N/A;   CAROTID-SUBCLAVIAN BYPASS GRAFT Left 08/12/2021    Procedure: LEFT CAROTID-BRACHIAL ARTERY BYPASS using Left greater saphenous Vein, harvested from left leg.;  Surgeon: Marty Heck, MD;  Location: [facility];  Service: Vascular;  Laterality: Left;   CAROTID-SUBCLAVIAN BYPASS GRAFT Left 08/13/2022    Procedure: REDO LEFT COMMON CAROTID-BRACHIAL ARTERY BYPASS USING PROPATEN GRAFT.;  Surgeon: Marty Heck, MD;  Location: [place];  Service: Vascular;  Laterality: Left;   CARPAL TUNNEL RELEASE Left 01/31/2021   CARPAL TUNNEL RELEASE Right 2021    Dr. Mardelle Matte   DILATION AND CURETTAGE OF UTERUS   2007   ENDARTERECTOMY Left 08/13/2022    Procedure: ENDARTERECTOMY CAROTID WITH BOVINE PATCH.;  Surgeon: Marty Heck, MD;  Location: [place];  Service: Vascular;  Laterality: Left;   ROTATOR CUFF REPAIR Right 2018  Dr. Mardelle Matte   TUBAL LIGATION   1992   UPPER EXTREMITY ANGIOGRAPHY Left 08/01/2021    Procedure: UPPER EXTREMITY ANGIOGRAPHY;  Surgeon: Marty Heck, MD;  Location: [place] [facility];  Service: Cardiovascular;  Laterality: Left;   UPPER EXTREMITY ANGIOGRAPHY Left 08/07/2022    Procedure: Upper Extremity Angiography;  Surgeon: Marty Heck, MD;  Location: [place] [facility];  Service: Cardiovascular;  Laterality: Left;      Social History         Socioeconomic History   Marital status: Single      Spouse  name: Not on file   Number of children: Not on file   Years of education: Not on file   Highest education level: Not on file  Occupational History   Not on file  Tobacco Use   Smoking status: Never   Smokeless tobacco: Never  Vaping Use   Vaping Use: Never used  Substance and Sexual Activity   Alcohol use: Yes      Comment: occasional   Drug use: Never   Sexual activity: Not on file  Other Topics Concern   Not on file  Social History Narrative   Not on file    Social Determinants of Health    Financial Resource Strain: Not on file  Food Insecurity: Not on file  Transportation Needs: Not on file  Physical Activity: Not on file  Stress: Not on file  Social Connections: Not on file  Intimate Partner Violence: Not on file           Family History  Problem Relation Age of Onset   Kidney failure Mother     Liver disease Mother     Early death Father     Hypertension Brother     Colon cancer Neg Hx     Stomach cancer Neg Hx              Current Outpatient Medications  Medication Sig Dispense Refill   amLODipine (NORVASC) 5 MG tablet TAKE 1 TABLET BY MOUTH EVERY DAY 90 tablet 1   aspirin EC 81 MG tablet Take 1 tablet (81 mg total) by mouth daily. Swallow whole. (Patient taking differently: Take 81 mg by mouth daily. Swallow whole.  Takes at hs) 150 tablet 2   atorvastatin (LIPITOR) 80 MG tablet Take 0.5 tablets (40 mg total) by mouth daily. (Patient taking differently: Take 80 mg by mouth daily. Takes at hs) 90 tablet 1   Blood Glucose Monitoring Suppl ([place]) w/Device KIT Use as directed to check blood sugars 2 times per day dx: e11.65 1 kit 1   Cholecalciferol (DIALYVITE VITAMIN D 5000 PO) Take 10,000 Units by mouth in the morning.       diclofenac Sodium (VOLTAREN) 1 % GEL Use as directed, apply to affected joints tid prn (Patient taking differently: Apply 1 Application topically 3 (three) times daily as needed (joint pain). Use as directed, apply to  affected joints tid prn) 150 g 2   empagliflozin (JARDIANCE) 25 MG TABS tablet Take 1 tablet (25 mg total) by mouth daily before breakfast. 90 tablet 1   ferrous sulfate 325 (65 FE) MG tablet TAKE 1 TABLET BY MOUTH EVERY DAY WITH BREAKFAST (Patient taking differently: Take 325 mg by mouth daily with breakfast.) 90 tablet 1   gabapentin (NEURONTIN) 300 MG capsule TAKE 1 CAPSULE BY MOUTH 3 (THREE) TIMES DAILY. TAKE 2 CAPSULES TO EQUAL 600MG AT NIGHT 180 capsule 2   glucose  blood (ONETOUCH VERIO) test strip USE TO CHECK BLOOD SUGAR TWICE A DAY 100 strip 12   insulin degludec (TRESIBA FLEXTOUCH) 200 UNIT/ML FlexTouch Pen Inject 70 Units into the skin every evening. 9 mL 43   Insulin Pen Needle (B-D ULTRAFINE III SHORT PEN) 31G X 8 MM MISC Use as directed with insulin pen 100 each 3   MAGNESIUM PO Take 1 tablet by mouth daily.       Multiple Vitamin (MULTIVITAMIN WITH MINERALS) TABS tablet Take 1 tablet by mouth daily.       NOVOLOG FLEXPEN 100 UNIT/ML FlexPen INJECT 12 UNITS 3 TIMES PER DAY WITH MEALS NOT TO EXCEED 50 UNITS 15 mL 1   Omega-3 Fatty Acids (FISH OIL PO) Take 1 capsule by mouth in the morning.       oxyCODONE-acetaminophen (PERCOCET/ROXICET) 5-325 MG tablet Take 1 tablet by mouth every 6 (six) hours as needed for moderate pain. 30 tablet 0   PRILOSEC OTC 20 MG tablet Take 20 mg by mouth daily.        Semaglutide, 2 MG/DOSE, (OZEMPIC, 2 MG/DOSE,) 8 MG/3ML SOPN Inject 2 mg into the skin once a week. (Patient taking differently: Inject 2 mg into the skin every Tuesday.) 3 mL 0   sertraline (ZOLOFT) 50 MG tablet TAKE 1 TABLET BY MOUTH EVERY DAY 90 tablet 0   Turmeric 500 MG CAPS Take 500 mg by mouth daily.       valACYclovir (VALTREX) 500 MG tablet TAKE 1 TABLET (500 MG TOTAL) BY MOUTH IN THE MORNING 90 tablet 1    No current facility-administered medications for this visit.           Allergies  Allergen Reactions   Promethazine Hcl Shortness Of Breath and Other (See Comments)       Bells palsy   Lisinopril Itching and Swelling        REVIEW OF SYSTEMS:    '[X]'  denotes positive finding, '[ ]'  denotes negative finding Cardiac   Comments:  Chest pain or chest pressure:      Shortness of breath upon exertion:      Short of breath when lying flat:      Irregular heart rhythm:             Vascular      Pain in calf, thigh, or hip brought on by ambulation:      Pain in feet at night that wakes you up from your sleep:       Blood clot in your veins:      Leg swelling:              Pulmonary      Oxygen at home:      Productive cough:       Wheezing:              Neurologic      Sudden weakness in arms or legs:       Sudden numbness in arms or legs:       Sudden onset of difficulty speaking or slurred speech:      Temporary loss of vision in one eye:       Problems with dizziness:              Gastrointestinal      Blood in stool:       Vomited blood:              Genitourinary      Burning when  urinating:       Blood in urine:             Psychiatric      Major depression:              Hematologic      Bleeding problems:      Problems with blood clotting too easily:             Skin      Rashes or ulcers:             Constitutional      Fever or chills:          PHYSICAL EXAMINATION:      Vitals:    09/16/22 1433  Pulse: 85  Resp: 20  Temp: 98.6 F (37 C)  TempSrc: Temporal  SpO2: 98%  Weight: 198 lb 8 oz (90 kg)  Height: '5\' 3"'  (1.6 m)      General:  WDWN in NAD; vital signs documented above Gait: Not observed HENT: WNL, normocephalic Pulmonary: normal non-labored breathing , without Rales, rhonchi,  wheezing Cardiac: regular HR Abdomen: soft, NT, no masses Skin: without rashes Vascular Exam/Pulses: 2+ palpable left radial pulse Extremities: Left neck incision well-healed; left brachial artery incision with some dehiscence with minimal thin drainage with surrounding erythema and well defined fluid collection Musculoskeletal: no  muscle wasting or atrophy       Neurologic: A&O X 3;  No focal weakness or paresthesias are detected Psychiatric:  The pt has Normal affect.     ASSESSMENT/PLAN:: 61 y.o. female here for evaluation of worsening skin changes around her left brachial artery incision   -Left hand is well-perfused with a 2+ palpable left radial pulse.  The third fingertip continues to improve and has a small gangrenous necrotic tip that will likely auto amputate.  Patient however is having skin changes around the incision of her left brachial artery.  The area is warm and red with some superficial dehiscence.  Dr. Carlis Abbott was involved in the evaluation and management plan today.  He image the graft with ultrasound which demonstrated fluid surrounding the graft all the way to the level of the shoulder.  Given these findings as well as the skin changes she was posted for incision and drainage of left brachial incision tomorrow in the operating room.  We will obtain cultures in the operating room.  She will likely have wound VAC placed.  She was also posted for possible graft excision however would only perform graft excision if findings were impressive and purulent in nature.  All questions were answered and she is agreeable to proceed tomorrow.  She will be n.p.o. after midnight.     Dagoberto Ligas, PA-C [facility] [phone]   Clinic MD:   Carlis Abbott

## 2022-09-17 NOTE — Anesthesia Procedure Notes (Signed)
Arterial Line Insertion Start/End10/18/2023 3:20 PM, 09/17/2022 3:25 PM Performed by: Pervis Hocking, DO, anesthesiologist  Patient location: Pre-op. Preanesthetic checklist: patient identified, IV checked, site marked, risks and benefits discussed, surgical consent, monitors and equipment checked, pre-op evaluation, timeout performed and anesthesia consent Lidocaine 1% used for infiltration Right, radial was placed Catheter size: 20 G Hand hygiene performed  and maximum sterile barriers used   Attempts: 1 Procedure performed using ultrasound guided technique. Ultrasound Notes:anatomy identified, needle tip was noted to be adjacent to the nerve/plexus identified, no ultrasound evidence of intravascular and/or intraneural injection and image(s) printed for medical record Following insertion, dressing applied. Post procedure assessment: normal and unchanged

## 2022-09-17 NOTE — Anesthesia Procedure Notes (Signed)
Procedure Name: Intubation Date/Time: 09/17/2022 3:17 PM  Performed by: Betha Loa, CRNAPre-anesthesia Checklist: Patient identified, Emergency Drugs available, Suction available and Patient being monitored Patient Re-evaluated:Patient Re-evaluated prior to induction Oxygen Delivery Method: Circle System Utilized Preoxygenation: Pre-oxygenation with 100% oxygen Induction Type: IV induction Ventilation: Mask ventilation without difficulty Laryngoscope Size: Mac and 3 Grade View: Grade III Tube type: Oral Tube size: 7.0 mm Number of attempts: 1 Airway Equipment and Method: Stylet and Oral airway Placement Confirmation: ETT inserted through vocal cords under direct vision, positive ETCO2 and breath sounds checked- equal and bilateral Secured at: 22 cm Tube secured with: Tape Dental Injury: Teeth and Oropharynx as per pre-operative assessment  Difficulty Due To: Difficult Airway- due to dentition and Difficulty was anticipated Future Recommendations: Recommend- induction with short-acting agent, and alternative techniques readily available Comments: Prior intubations notes state G3-4 view with MAC blades and Glidescope intubation.

## 2022-09-18 ENCOUNTER — Encounter (HOSPITAL_COMMUNITY): Payer: Self-pay | Admitting: Vascular Surgery

## 2022-09-18 LAB — GLUCOSE, CAPILLARY
Glucose-Capillary: 442 mg/dL — ABNORMAL HIGH (ref 70–99)
Glucose-Capillary: 330 mg/dL — ABNORMAL HIGH (ref 70–99)
Glucose-Capillary: 253 mg/dL — ABNORMAL HIGH (ref 70–99)
Glucose-Capillary: 265 mg/dL — ABNORMAL HIGH (ref 70–99)

## 2022-09-18 LAB — BASIC METABOLIC PANEL
Anion gap: 10 (ref 5–15)
BUN: 28 mg/dL — ABNORMAL HIGH (ref 8–23)
CO2: 19 mmol/L — ABNORMAL LOW (ref 22–32)
Calcium: 8.4 mg/dL — ABNORMAL LOW (ref 8.9–10.3)
Chloride: 105 mmol/L (ref 98–111)
Creatinine, Ser: 1.38 mg/dL — ABNORMAL HIGH (ref 0.44–1.00)
GFR, Estimated: 44 mL/min — ABNORMAL LOW (ref >60)
Glucose, Bld: 433 mg/dL — ABNORMAL HIGH (ref 70–99)
Potassium: 5.6 mmol/L — ABNORMAL HIGH (ref 3.5–5.1)
Sodium: 134 mmol/L — ABNORMAL LOW (ref 135–145)

## 2022-09-18 LAB — CBC
HCT: 34 % — ABNORMAL LOW (ref 36.0–46.0)
Hemoglobin: 10.9 g/dL — ABNORMAL LOW (ref 12.0–15.0)
MCH: 27.8 pg (ref 26.0–34.0)
MCHC: 32.1 g/dL (ref 30.0–36.0)
MCV: 86.7 fL (ref 80.0–100.0)
Platelets: 322 10*3/uL (ref 150–400)
RBC: 3.92 MIL/uL (ref 3.87–5.11)
RDW: 16.8 % — ABNORMAL HIGH (ref 11.5–15.5)
WBC: 10.1 10*3/uL (ref 4.0–10.5)
nRBC: 0 % (ref 0.0–0.2)

## 2022-09-18 MED ORDER — INSULIN ASPART 100 UNIT/ML IJ SOLN
6.0000 [IU] | Freq: Three times a day (TID) | INTRAMUSCULAR | Status: DC
  Administered 2022-09-18 – 2022-09-19 (×4): 6 [IU] via SUBCUTANEOUS

## 2022-09-18 MED ORDER — INSULIN GLARGINE-YFGN 100 UNIT/ML ~~LOC~~ SOLN
60.0000 [IU] | Freq: Every day | SUBCUTANEOUS | Status: DC
  Administered 2022-09-18 – 2022-09-20 (×3): 60 [IU] via SUBCUTANEOUS
  Filled 2022-09-18 (×3): qty 0.6

## 2022-09-18 MED ORDER — EMPAGLIFLOZIN 25 MG PO TABS
25.0000 mg | ORAL_TABLET | Freq: Every day | ORAL | Status: DC
  Administered 2022-09-18 – 2022-09-20 (×3): 25 mg via ORAL
  Filled 2022-09-18 (×3): qty 1

## 2022-09-18 MED ORDER — INSULIN GLARGINE-YFGN 100 UNIT/ML ~~LOC~~ SOLN
70.0000 [IU] | Freq: Every day | SUBCUTANEOUS | Status: DC
  Filled 2022-09-18: qty 0.7

## 2022-09-18 NOTE — Progress Notes (Addendum)
  Progress Note    09/18/2022 7:55 AM 1 Day Post-Op  Subjective:  Minimal soreness.  L hand edema   Vitals:   09/18/22 0044 09/18/22 0634  BP: 124/62 (!) 113/50  Pulse: 80 87  Resp: 16 18  Temp: 97.7 F (36.5 C) 98 F (36.7 C)  SpO2: 98% 100%   Physical Exam: Lungs:  non labored Incisions:  L arm with prevena and ACE wrap in place Extremities:  palpable L radial pulse Neurologic: A&O  CBC    Component Value Date/Time   WBC 10.1 09/18/2022 0337   RBC 3.92 09/18/2022 0337   HGB 10.9 (L) 09/18/2022 0337   HGB 13.7 07/30/2022 1222   HCT 34.0 (L) 09/18/2022 0337   HCT 42.5 07/30/2022 1222   PLT 322 09/18/2022 0337   PLT 422 07/30/2022 1222   MCV 86.7 09/18/2022 0337   MCV 85 07/30/2022 1222   MCH 27.8 09/18/2022 0337   MCHC 32.1 09/18/2022 0337   RDW 16.8 (H) 09/18/2022 0337   RDW 17.2 (H) 07/30/2022 1222   LYMPHSABS 1.7 09/26/2010 0735   MONOABS 0.7 09/26/2010 0735   EOSABS 0.2 09/26/2010 0735   BASOSABS 0.0 09/26/2010 0735    BMET    Component Value Date/Time   NA 134 (L) 09/18/2022 0056   NA 138 07/30/2022 1222   K 5.6 (H) 09/18/2022 0056   CL 105 09/18/2022 0056   CO2 19 (L) 09/18/2022 0056   GLUCOSE 433 (H) 09/18/2022 0056   BUN 28 (H) 09/18/2022 0056   BUN 16 07/30/2022 1222   CREATININE 1.38 (H) 09/18/2022 0056   CALCIUM 8.4 (L) 09/18/2022 0056   GFRNONAA 44 (L) 09/18/2022 0056   GFRAA 70 10/29/2020 1109    INR    Component Value Date/Time   INR 1.1 08/13/2022 0647     Intake/Output Summary (Last 24 hours) at 09/18/2022 0755 Last data filed at 09/17/2022 1900 Gross per 24 hour  Intake 1550 ml  Output 0 ml  Net 1550 ml     Assessment/Plan:  61 y.o. female is s/p I&D L arm with placement of antibiotic beads and morcells with incisional vac placement 1 Day Post-Op   L hand well perfused with palpable radial pulse Continue wrap and prevena; can re-do ace wrap to include the hand if hand edema worsens Continue broad spectrum  antibiotics until culture returns Add back diabetes meds today Home when culture data returns   Dagoberto Ligas, PA-C [facility] [phone] 09/18/2022 7:55 AM  VASCULAR STAFF ADDENDUM: I have independently interviewed and examined the patient. I agree with the above.  Wrap from hand, up the arm. Palpable pulse at the wrist. Will follow cultures.   Cassandria Santee, MD [facility] Phone Number: [phone] 09/18/2022 8:25 AM

## 2022-09-18 NOTE — Progress Notes (Signed)
Inpatient Diabetes Program Recommendations  AACE/ADA: New Consensus Statement on Inpatient Glycemic Control (2015)  Target Ranges:  Prepandial:   less than 140 mg/dL      Peak postprandial:   less than 180 mg/dL (1-2 hours)      Critically ill patients:  140 - 180 mg/dL   Lab Results  Component Value Date   GLUCAP 442 (H) 09/18/2022   HGBA1C 11.2 (H) 07/30/2022    Review of Glycemic Control  Latest Reference Range & Units 09/17/22 11:34 09/17/22 14:11 09/17/22 16:57 09/18/22 06:34  Glucose-Capillary 70 - 99 mg/dL 190 (H) 148 (H) 125 (H) 442 (H)   Diabetes history: DM 2 Outpatient Diabetes medications:  Jardiance 25 mg daily, Tresiba 70 units q evening, Novolog 12 units tid with meals Current orders for Inpatient glycemic control:  Jardiance 25 mg daily Semglee 70 units (starts this evening) Novolog moderate (0-15 units) tid with meals  Inpatient Diabetes Program Recommendations:   Note patient received Decadron 4 mg x1 yesterday which likely increased blood sugars.  Please consider slight reduction of Semglee to 60 units daily and start this morning.   Also please consider adding Novolog 6 units tid with meals for meal coverage.   Thanks,  Adah Perl, RN, BC-ADM Inpatient Diabetes Coordinator Pager [phone]  (8a-5p)

## 2022-09-19 LAB — BASIC METABOLIC PANEL
Anion gap: 10 (ref 5–15)
BUN: 30 mg/dL — ABNORMAL HIGH (ref 8–23)
CO2: 18 mmol/L — ABNORMAL LOW (ref 22–32)
Calcium: 8.3 mg/dL — ABNORMAL LOW (ref 8.9–10.3)
Chloride: 108 mmol/L (ref 98–111)
Creatinine, Ser: 1.53 mg/dL — ABNORMAL HIGH (ref 0.44–1.00)
GFR, Estimated: 38 mL/min — ABNORMAL LOW (ref >60)
Glucose, Bld: 253 mg/dL — ABNORMAL HIGH (ref 70–99)
Potassium: 4 mmol/L (ref 3.5–5.1)
Sodium: 136 mmol/L (ref 135–145)

## 2022-09-19 LAB — GLUCOSE, CAPILLARY
Glucose-Capillary: 166 mg/dL — ABNORMAL HIGH (ref 70–99)
Glucose-Capillary: 163 mg/dL — ABNORMAL HIGH (ref 70–99)
Glucose-Capillary: 250 mg/dL — ABNORMAL HIGH (ref 70–99)
Glucose-Capillary: 174 mg/dL — ABNORMAL HIGH (ref 70–99)

## 2022-09-19 MED ORDER — LIVING WELL WITH DIABETES BOOK
Freq: Once | Status: AC
  Filled 2022-09-19: qty 1

## 2022-09-19 NOTE — Progress Notes (Signed)
  Progress Note    09/19/2022 7:47 AM 2 Days Post-Op  Subjective:  Soreness.  L hand edema.   Vitals:   09/18/22 2326 09/19/22 0422  BP: (!) 103/44 129/60  Pulse: 90 98  Resp: 20 20  Temp: 99 F (37.2 C) 99.2 F (37.3 C)  SpO2: 95% 97%   Physical Exam: Lungs:  non labored Incisions:  L arm with prevena and ACE wrap in place Extremities:  palpable L radial pulse Neurologic: A&O Fluctuant area in the neck - site of perigraft seroma. Will continue to monitor.  CBC    Component Value Date/Time   WBC 10.1 09/18/2022 0337   RBC 3.92 09/18/2022 0337   HGB 10.9 (L) 09/18/2022 0337   HGB 13.7 07/30/2022 1222   HCT 34.0 (L) 09/18/2022 0337   HCT 42.5 07/30/2022 1222   PLT 322 09/18/2022 0337   PLT 422 07/30/2022 1222   MCV 86.7 09/18/2022 0337   MCV 85 07/30/2022 1222   MCH 27.8 09/18/2022 0337   MCHC 32.1 09/18/2022 0337   RDW 16.8 (H) 09/18/2022 0337   RDW 17.2 (H) 07/30/2022 1222   LYMPHSABS 1.7 09/26/2010 0735   MONOABS 0.7 09/26/2010 0735   EOSABS 0.2 09/26/2010 0735   BASOSABS 0.0 09/26/2010 0735    BMET    Component Value Date/Time   NA 136 09/19/2022 0100   NA 138 07/30/2022 1222   K 4.0 09/19/2022 0100   CL 108 09/19/2022 0100   CO2 18 (L) 09/19/2022 0100   GLUCOSE 253 (H) 09/19/2022 0100   BUN 30 (H) 09/19/2022 0100   BUN 16 07/30/2022 1222   CREATININE 1.53 (H) 09/19/2022 0100   CALCIUM 8.3 (L) 09/19/2022 0100   GFRNONAA 38 (L) 09/19/2022 0100   GFRAA 70 10/29/2020 1109    INR    Component Value Date/Time   INR 1.1 08/13/2022 0647     Intake/Output Summary (Last 24 hours) at 09/19/2022 0747 Last data filed at 09/19/2022 0308 Gross per 24 hour  Intake 1470.28 ml  Output 0 ml  Net 1470.28 ml      Assessment/Plan:  61 y.o. female is s/p I&D L arm with placement of antibiotic beads and morcells with incisional vac placement 2 Days Post-Op   L hand well perfused with palpable radial pulse Continue wrap and prevena; can re-do ace wrap  to include the hand if hand edema worsens Continue broad spectrum antibiotics until culture returns Perigraft seroma extends to the neck incision. No plan for operative intervention at this time. Will continue to monitor.  Culture - rare staph aureus, possible contaminant. Will continue to follow.  Continue current medication regimen. Possible home over the weekend.    Broadus John MD [facility] [phone] 09/19/2022 7:47 AM

## 2022-09-19 NOTE — Inpatient Diabetes Management (Signed)
Inpatient Diabetes Program Recommendations  AACE/ADA: New Consensus Statement on Inpatient Glycemic Control (2015)  Target Ranges:  Prepandial:   less than 140 mg/dL      Peak postprandial:   less than 180 mg/dL (1-2 hours)      Critically ill patients:  140 - 180 mg/dL   Lab Results  Component Value Date   GLUCAP 163 (H) 09/19/2022   HGBA1C 11.2 (H) 07/30/2022    Review of Glycemic Control  Latest Reference Range & Units 09/19/22 06:04 09/19/22 12:20  Glucose-Capillary 70 - 99 mg/dL 166 (H) 163 (H)  (H): Data is abnormally high  Diabetes history: DM Outpatient Diabetes medications:  Tresiba 70 units QD, Novolog 12 units TID, Ozempic 2 mg weekly Current orders for Inpatient glycemic control:  Jardiance 25 mg qd Novolog 0-15 units TID Novolog 6 units TID with meals  Semglee 60 units QD  Spoke with patient regarding A1C of 11.2% on 07/30/22.   She has since been set up with a dietician for education.  She has been trying to switch over to non-caloric beverages.  She is learning about portion control, CHO's and lifestyle changes.  She confirms above home medications and denies difficulties obtaining DM medications.  She does not skip doses.  Denies episodes of hypoglycemia.  She is aware of signs, symptoms and treatments.  Discussed long and short term complications of uncontrolled glucose.     Will continue to follow while inpatient.  Thank you, Reche Dixon, MSN, [place] Diabetes Coordinator Inpatient Diabetes Program [phone] (team pager from 8a-5p)

## 2022-09-19 NOTE — Plan of Care (Signed)
  Problem: Education: Goal: Ability to describe self-care measures that may prevent or decrease complications (Diabetes Survival Skills Education) will improve Outcome: Progressing Goal: Individualized Educational Video(s) Outcome: Progressing   Problem: Coping: Goal: Ability to adjust to condition or change in health will improve Outcome: Progressing   Problem: Fluid Volume: Goal: Ability to maintain a balanced intake and output will improve Outcome: Progressing   Problem: Health Behavior/Discharge Planning: Goal: Ability to identify and utilize available resources and services will improve Outcome: Progressing Goal: Ability to manage health-related needs will improve Outcome: Progressing   Problem: Metabolic: Goal: Ability to maintain appropriate glucose levels will improve Outcome: Progressing   Problem: Nutritional: Goal: Maintenance of adequate nutrition will improve Outcome: Progressing Goal: Progress toward achieving an optimal weight will improve Outcome: Progressing   Problem: Skin Integrity: Goal: Risk for impaired skin integrity will decrease Outcome: Progressing   Problem: Tissue Perfusion: Goal: Adequacy of tissue perfusion will improve Outcome: Progressing   Problem: Education: Goal: Knowledge of prescribed regimen will improve Outcome: Progressing   Problem: Activity: Goal: Ability to tolerate increased activity will improve Outcome: Progressing   Problem: Bowel/Gastric: Goal: Gastrointestinal status for postoperative course will improve Outcome: Progressing   Problem: Clinical Measurements: Goal: Postoperative complications will be avoided or minimized Outcome: Progressing Goal: Signs and symptoms of graft occlusion will improve Outcome: Progressing   Problem: Skin Integrity: Goal: Demonstration of wound healing without infection will improve Outcome: Progressing

## 2022-09-20 LAB — GLUCOSE, CAPILLARY
Glucose-Capillary: 144 mg/dL — ABNORMAL HIGH (ref 70–99)
Glucose-Capillary: 176 mg/dL — ABNORMAL HIGH (ref 70–99)

## 2022-09-20 MED ORDER — AMOXICILLIN-POT CLAVULANATE 875-125 MG PO TABS: 1.0000 | ORAL_TABLET | Freq: Two times a day (BID) | ORAL | 0 refills | Status: DC

## 2022-09-20 MED ORDER — OXYCODONE-ACETAMINOPHEN 5-325 MG PO TABS: 1.0000 | ORAL_TABLET | Freq: Four times a day (QID) | ORAL | 0 refills | Status: DC | PRN

## 2022-09-20 NOTE — Discharge Instructions (Signed)
Keep Pravena vac on your incision until it loses it seal in about 7-10 days.  Once that happens, you can remove and then wash the incision with soap and water daily and pat dry. (No tub bath-only shower)  Then put a dry gauze on incision to keep the area dry to help prevent wound infection.  Do this daily and as needed.  Do not use Vaseline or neosporin on your incisions.  Only use soap and water on your incisions and then protect and keep dry.  If you notice any of the following from your incision site: drainage, bleeding, warmth, redness or begin having worsening pain, fever or chills please contact our office

## 2022-09-20 NOTE — Discharge Summary (Signed)
Discharge Summary  Patient ID: Meagan [name] [id number] 61 y.o. [birth year]  Admit date: 09/17/2022  Discharge date and time: No discharge date for patient encounter.   Admitting Physician: Marty Heck, MD   Discharge Physician: ***  Admission Diagnoses: Non-healing surgical wound [T81.89XA]  Discharge Diagnoses: ***  Admission Condition: {condition:18240}  Discharged Condition: {condition:18240}  Indication for Admission: ***  Hospital Course: ***  Consults: {consultation:18241}  Treatments: {Tx:18249}  Discharge Exam: *** Vitals:   09/20/22 0350 09/20/22 0827  BP: (!) 133/57 (!) 121/52  Pulse: 77 86  Resp: 18 18  Temp: 98.2 F (36.8 C) 98.2 F (36.8 C)  SpO2: 100%    Cardiac:  *** Lungs:  *** Incisions:  *** Extremities:  *** Abdomen:  *** Neurologic: ***   Disposition:   Patient Instructions:  Allergies as of 09/20/2022       Reactions   Promethazine Hcl Shortness Of Breath, Other (See Comments)   Bells palsy   Lisinopril Itching, Swelling        Medication List     TAKE these medications    amLODipine 5 MG tablet Commonly known as: NORVASC TAKE 1 TABLET BY MOUTH EVERY DAY What changed:  how much to take how to take this when to take this   amoxicillin-clavulanate 875-125 MG tablet Commonly known as: AUGMENTIN Take 1 tablet by mouth 2 (two) times daily for 14 days.   aspirin EC 81 MG tablet Take 1 tablet (81 mg total) by mouth daily. Swallow whole. What changed: when to take this   atorvastatin 80 MG tablet Commonly known as: LIPITOR Take 0.5 tablets (40 mg total) by mouth daily. What changed:  how much to take when to take this   B-D ULTRAFINE III SHORT PEN 31G X 8 MM Misc Generic drug: Insulin Pen Needle Use as directed with insulin pen   DIALYVITE VITAMIN D 5000 PO Take 10,000 Units by mouth at bedtime.   diclofenac Sodium 1 % Gel Commonly known as: VOLTAREN Use as directed, apply to affected joints  tid prn What changed:  how much to take how to take this when to take this reasons to take this   empagliflozin 25 MG Tabs tablet Commonly known as: Jardiance Take 1 tablet (25 mg total) by mouth daily before breakfast. What changed: when to take this   ferrous sulfate 325 (65 FE) MG tablet TAKE 1 TABLET BY MOUTH EVERY DAY WITH BREAKFAST What changed: See the new instructions.   FISH OIL PO Take 1 capsule by mouth in the morning.   gabapentin 300 MG capsule Commonly known as: NEURONTIN TAKE 1 CAPSULE BY MOUTH 3 (THREE) TIMES DAILY. TAKE 2 CAPSULES TO EQUAL 600MG AT NIGHT   GRAPESEED EXTRACT PO Take 1 capsule by mouth daily.   MAGNESIUM PO Take 1 tablet by mouth daily.   multivitamin with minerals Tabs tablet Take 1 tablet by mouth daily.   NovoLOG FlexPen 100 UNIT/ML FlexPen Generic drug: insulin aspart INJECT 12 UNITS 3 TIMES PER DAY WITH MEALS NOT TO EXCEED 50 UNITS   omeprazole 20 MG tablet Commonly known as: PRILOSEC OTC Take 20 mg by mouth daily.   OneTouch Verio Flex System w/Device Kit Use as directed to check blood sugars 2 times per day dx: e11.65   OneTouch Verio test strip Generic drug: glucose blood USE TO CHECK BLOOD SUGAR TWICE A DAY   oxyCODONE-acetaminophen 5-325 MG tablet Commonly known as: PERCOCET/ROXICET Take 1 tablet by mouth every 6 (six) hours as needed for  moderate pain.   Ozempic (2 MG/DOSE) 8 MG/3ML Sopn Generic drug: Semaglutide (2 MG/DOSE) Inject 2 mg into the skin once a week. What changed: when to take this   sertraline 50 MG tablet Commonly known as: ZOLOFT TAKE 1 TABLET BY MOUTH EVERY DAY What changed:  how much to take how to take this when to take this   Tresiba FlexTouch 200 UNIT/ML FlexTouch Pen Generic drug: insulin degludec Inject 70 Units into the skin every evening.   Turmeric 500 MG Caps Take 500 mg by mouth at bedtime.   valACYclovir 500 MG tablet Commonly known as: VALTREX TAKE 1 TABLET (500 MG TOTAL)  BY MOUTH IN THE MORNING What changed: when to take this       Activity: {discharge activity:18261} Diet: {diet:18262} Wound Care: {wound care:18263}  Follow-up with *** in {0-10:33138} {units:11}.  SignedKaroline Caldwell, PA-C 09/20/2022 8:36 AM [facility] Office: [phone]

## 2022-09-20 NOTE — Progress Notes (Addendum)
  Progress Note    09/20/2022 8:20 AM 3 Days Post-Op  Subjective:  no complaints. Left arm and hand swelling improved   Vitals:   09/19/22 2337 09/20/22 0350  BP: (!) 113/57 (!) 133/57  Pulse: 91 77  Resp: 18 18  Temp: 98.2 F (36.8 C) 98.2 F (36.8 C)  SpO2: 100% 100%   Physical Exam: Cardiac:  regular Lungs:  non labored Incisions:  left upper arm incision with Prevena VAC to suction. ACE taken down and reapplied Thre is perigraft seroma extending up right anterior right shoulder to right supraclavicular region, unchanged. No signs of infection Extremities:  2+ left radial pulse, left hand warm and well perfused. Minimal edema Neurologic: alert and oriented  CBC    Component Value Date/Time   WBC 10.1 09/18/2022 0337   RBC 3.92 09/18/2022 0337   HGB 10.9 (L) 09/18/2022 0337   HGB 13.7 07/30/2022 1222   HCT 34.0 (L) 09/18/2022 0337   HCT 42.5 07/30/2022 1222   PLT 322 09/18/2022 0337   PLT 422 07/30/2022 1222   MCV 86.7 09/18/2022 0337   MCV 85 07/30/2022 1222   MCH 27.8 09/18/2022 0337   MCHC 32.1 09/18/2022 0337   RDW 16.8 (H) 09/18/2022 0337   RDW 17.2 (H) 07/30/2022 1222   LYMPHSABS 1.7 09/26/2010 0735   MONOABS 0.7 09/26/2010 0735   EOSABS 0.2 09/26/2010 0735   BASOSABS 0.0 09/26/2010 0735    BMET    Component Value Date/Time   NA 136 09/19/2022 0100   NA 138 07/30/2022 1222   K 4.0 09/19/2022 0100   CL 108 09/19/2022 0100   CO2 18 (L) 09/19/2022 0100   GLUCOSE 253 (H) 09/19/2022 0100   BUN 30 (H) 09/19/2022 0100   BUN 16 07/30/2022 1222   CREATININE 1.53 (H) 09/19/2022 0100   CALCIUM 8.3 (L) 09/19/2022 0100   GFRNONAA 38 (L) 09/19/2022 0100   GFRAA 70 10/29/2020 1109    INR    Component Value Date/Time   INR 1.1 08/13/2022 0647     Intake/Output Summary (Last 24 hours) at 09/20/2022 0820 Last data filed at 09/19/2022 1500 Gross per 24 hour  Intake 480 ml  Output --  Net 480 ml     Assessment/Plan:  61 y.o. female is s/p I&D L  arm with placement of antibiotic beads and morcells with incisional vac placement 3 Days Post-Op   L hand well perfused with palpable radial pulse Continue wrap and Prevena Elevate LUE PRN Final cultures pending with susceptibilities- so far rare staph aureus Perigraft seroma extends to the neck incision. No plan for operative intervention at this time Okay for patient to d/c home today on PO antibiotics Augmentin and will follow cultures to see if any adjustments in ABx need to be arranged She will have short interval follow up in our office in 1-2 weeks   Karoline Caldwell, [place] [facility] [phone] 09/20/2022 8:20 AM  I have independently interviewed and examined patient and agree with PA assessment and plan above.   Kaizlee Carlino C. Donzetta Matters, MD [facility] [place] Office: [phone] Pager: [phone]

## 2022-09-20 NOTE — TOC CM/SW Note (Signed)
Spoke with Meagan [name] prior to [employer]. Denies having any TOC needs. Daughter to stay with her for 1 week and will transport Meagan [name] home today.  No identifiable TOC needs.    Marthenia Rolling, MSN, RN,BSN [facility]patient [facility] Case Manager [phone]

## 2022-09-22 LAB — AEROBIC/ANAEROBIC CULTURE W GRAM STAIN (SURGICAL/DEEP WOUND): Gram Stain: NONE SEEN

## 2022-09-25 ENCOUNTER — Telehealth: Payer: Self-pay

## 2022-09-25 NOTE — Telephone Encounter (Signed)
Pt called stating that her "wound vac had died" and she was told to make an appt when that happened.  Reviewed pt's chart, returned call for clarification, two identifiers used. Reviewed the information on her d/c summary with her concerning removal of the wound vac and wound care until her appt on 10/31. Confirmed understanding.

## 2022-09-30 ENCOUNTER — Ambulatory Visit (INDEPENDENT_AMBULATORY_CARE_PROVIDER_SITE_OTHER): Payer: BC Managed Care – PPO | Admitting: Vascular Surgery

## 2022-09-30 ENCOUNTER — Encounter: Payer: Self-pay | Admitting: Vascular Surgery

## 2022-09-30 VITALS — BP 97/49 | HR 74 | Temp 97.4°F | Resp 14 | Ht 63.0 in | Wt 200.0 lb

## 2022-09-30 DIAGNOSIS — I998 Other disorder of circulatory system: Secondary | ICD-10-CM

## 2022-09-30 DIAGNOSIS — I6523 Occlusion and stenosis of bilateral carotid arteries: Secondary | ICD-10-CM

## 2022-09-30 MED ORDER — AMOXICILLIN-POT CLAVULANATE 875-125 MG PO TABS: 1.0000 | ORAL_TABLET | Freq: Two times a day (BID) | ORAL | 0 refills | Status: AC

## 2022-09-30 NOTE — Progress Notes (Signed)
Patient name: Meagan [name] MRN: [id number] DOB: 1961-04-22 Sex: female  REASON FOR VISIT: Postop check  HPI: Meagan [name] is a 61 y.o. female that presents for postop check.  She most recently underwent left carotid endarterectomy with a redo left common carotid artery to brachial artery bypass with PTFE on 08/13/2022.  This was for an occluded left common carotid to brachial artery bypass with vein in the setting of recurrent tissue loss.  Ultimately she developed some breakdown of her antecubital incision that required I&D and evacuation of the seroma.  Cultures did grow MSSA.  She has been on Bactrim.  No new concerns.  Incision healing.  No fevers or chills.  The tissue loss in her left 3rd digit is healing.  Past Medical History:  Diagnosis Date   Arthritis    Chronic kidney disease    Complication of anesthesia    Diabetes mellitus (HCC)    GERD (gastroesophageal reflux disease)    Hyperlipidemia    Hypertension    Neuropathy    Pneumonia    PONV (postoperative nausea and vomiting)     Past Surgical History:  Procedure Laterality Date   AORTIC ARCH ANGIOGRAPHY N/A 08/01/2021   Procedure: AORTIC ARCH ANGIOGRAPHY;  Surgeon: Marty Heck, MD;  Location: [place] [facility];  Service: Cardiovascular;  Laterality: N/A;   AORTIC ARCH ANGIOGRAPHY N/A 08/07/2022   Procedure: AORTIC ARCH ANGIOGRAPHY;  Surgeon: Marty Heck, MD;  Location: [place] [facility];  Service: Cardiovascular;  Laterality: N/A;   CAROTID-SUBCLAVIAN BYPASS GRAFT Left 08/12/2021   Procedure: LEFT CAROTID-BRACHIAL ARTERY BYPASS using Left greater saphenous Vein, harvested from left leg.;  Surgeon: Marty Heck, MD;  Location: [facility] OR;  Service: Vascular;  Laterality: Left;   CAROTID-SUBCLAVIAN BYPASS GRAFT Left 08/13/2022   Procedure: REDO LEFT COMMON CAROTID-BRACHIAL ARTERY BYPASS USING PROPATEN GRAFT.;  Surgeon: Marty Heck, MD;  Location: [place];  Service: Vascular;  Laterality:  Left;   CARPAL TUNNEL RELEASE Left 01/31/2021   CARPAL TUNNEL RELEASE Right 2021   Dr. Mardelle Matte   DILATION AND CURETTAGE OF UTERUS  2007   ENDARTERECTOMY Left 08/13/2022   Procedure: ENDARTERECTOMY CAROTID WITH BOVINE PATCH.;  Surgeon: Marty Heck, MD;  Location: [place];  Service: Vascular;  Laterality: Left;   INCISION AND DRAINAGE Left 09/17/2022   Procedure: INCISION AND DRAINAGE LEFT ARM;  Surgeon: Marty Heck, MD;  Location: [place];  Service: Vascular;  Laterality: Left;   ROTATOR CUFF REPAIR Right 2018   Dr. Mardelle Matte   TUBAL LIGATION  1992   UPPER EXTREMITY ANGIOGRAPHY Left 08/01/2021   Procedure: UPPER EXTREMITY ANGIOGRAPHY;  Surgeon: Marty Heck, MD;  Location: [place] [facility];  Service: Cardiovascular;  Laterality: Left;   UPPER EXTREMITY ANGIOGRAPHY Left 08/07/2022   Procedure: Upper Extremity Angiography;  Surgeon: Marty Heck, MD;  Location: [place] [facility];  Service: Cardiovascular;  Laterality: Left;    Family History  Problem Relation Age of Onset   Kidney failure Mother    Liver disease Mother    Early death Father    Hypertension Brother    Colon cancer Neg Hx    Stomach cancer Neg Hx     SOCIAL HISTORY: Social History   Tobacco Use   Smoking status: Never   Smokeless tobacco: Never  Substance Use Topics   Alcohol use: Yes    Comment: occasional    Allergies  Allergen Reactions   Promethazine Hcl Shortness Of Breath and Other (  See Comments)    Bells palsy   Lisinopril Itching and Swelling    Current Outpatient Medications  Medication Sig Dispense Refill   amLODipine (NORVASC) 5 MG tablet TAKE 1 TABLET BY MOUTH EVERY DAY (Patient taking differently: at bedtime.) 90 tablet 1   aspirin EC 81 MG tablet Take 1 tablet (81 mg total) by mouth daily. Swallow whole. (Patient taking differently: Take 81 mg by mouth at bedtime. Swallow whole.) 150 tablet 2   atorvastatin (LIPITOR) 80 MG tablet Take 0.5 tablets (40 mg total) by  mouth daily. (Patient taking differently: Take 80 mg by mouth at bedtime.) 90 tablet 1   Blood Glucose Monitoring Suppl (ONETOUCH VERIO FLEX SYSTEM) w/Device KIT Use as directed to check blood sugars 2 times per day dx: e11.65 1 kit 1   Cholecalciferol (DIALYVITE VITAMIN D 5000 PO) Take 10,000 Units by mouth at bedtime.     diclofenac Sodium (VOLTAREN) 1 % GEL Use as directed, apply to affected joints tid prn (Patient taking differently: Apply 1 Application topically 3 (three) times daily as needed (joint pain). Use as directed, apply to affected joints tid prn) 150 g 2   empagliflozin (JARDIANCE) 25 MG TABS tablet Take 1 tablet (25 mg total) by mouth daily before breakfast. (Patient taking differently: Take 25 mg by mouth at bedtime.) 90 tablet 1   ferrous sulfate 325 (65 FE) MG tablet TAKE 1 TABLET BY MOUTH EVERY DAY WITH BREAKFAST (Patient taking differently: Take 325 mg by mouth at bedtime.) 90 tablet 1   gabapentin (NEURONTIN) 300 MG capsule TAKE 1 CAPSULE BY MOUTH 3 (THREE) TIMES DAILY. TAKE 2 CAPSULES TO EQUAL 600MG AT NIGHT 180 capsule 2   glucose blood (ONETOUCH VERIO) test strip USE TO CHECK BLOOD SUGAR TWICE A DAY 100 strip 12   insulin degludec (TRESIBA FLEXTOUCH) 200 UNIT/ML FlexTouch Pen Inject 70 Units into the skin every evening. 9 mL 43   Insulin Pen Needle (B-D ULTRAFINE III SHORT PEN) 31G X 8 MM MISC Use as directed with insulin pen 100 each 3   MAGNESIUM PO Take 1 tablet by mouth daily.     Multiple Vitamin (MULTIVITAMIN WITH MINERALS) TABS tablet Take 1 tablet by mouth daily.     NOVOLOG FLEXPEN 100 UNIT/ML FlexPen INJECT 12 UNITS 3 TIMES PER DAY WITH MEALS NOT TO EXCEED 50 UNITS 15 mL 1   Nutritional Supplements (GRAPESEED EXTRACT PO) Take 1 capsule by mouth daily.     Omega-3 Fatty Acids (FISH OIL PO) Take 1 capsule by mouth in the morning.     omeprazole (PRILOSEC OTC) 20 MG tablet Take 20 mg by mouth daily.     Semaglutide, 2 MG/DOSE, (OZEMPIC, 2 MG/DOSE,) 8 MG/3ML SOPN  Inject 2 mg into the skin once a week. (Patient taking differently: Inject 2 mg into the skin every Tuesday.) 3 mL 0   sertraline (ZOLOFT) 50 MG tablet TAKE 1 TABLET BY MOUTH EVERY DAY (Patient taking differently: at bedtime.) 90 tablet 0   Turmeric 500 MG CAPS Take 500 mg by mouth at bedtime.     valACYclovir (VALTREX) 500 MG tablet TAKE 1 TABLET (500 MG TOTAL) BY MOUTH IN THE MORNING (Patient taking differently: Take 500 mg by mouth at bedtime.) 90 tablet 1   amoxicillin-clavulanate (AUGMENTIN) 875-125 MG tablet Take 1 tablet by mouth 2 (two) times daily. 60 tablet 0   oxyCODONE-acetaminophen (PERCOCET/ROXICET) 5-325 MG tablet Take 1 tablet by mouth every 6 (six) hours as needed for moderate pain. (Patient not taking:  Reported on 09/30/2022) 20 tablet 0   No current facility-administered medications for this visit.    REVIEW OF SYSTEMS:  _0  denotes positive finding, _1  denotes negative finding Cardiac  Comments:  Chest pain or chest pressure:    Shortness of breath upon exertion:    Short of breath when lying flat:    Irregular heart rhythm:        Vascular    Pain in calf, thigh, or hip brought on by ambulation:    Pain in feet at night that wakes you up from your sleep:     Blood clot in your veins:    Leg swelling:         Pulmonary    Oxygen at home:    Productive cough:     Wheezing:         Neurologic    Sudden weakness in arms or legs:     Sudden numbness in arms or legs:     Sudden onset of difficulty speaking or slurred speech:    Temporary loss of vision in one eye:     Problems with dizziness:         Gastrointestinal    Blood in stool:     Vomited blood:         Genitourinary    Burning when urinating:     Blood in urine:        Psychiatric    Major depression:         Hematologic    Bleeding problems:    Problems with blood clotting too easily:        Skin    Rashes or ulcers:        Constitutional    Fever or chills:      PHYSICAL  EXAM: Vitals:   09/30/22 1415  BP: (!) 97/49  Pulse: 74  Resp: 14  Temp: (!) 97.4 F (36.3 C)  TempSrc: Temporal  SpO2: 97%  Weight: 200 lb (90.7 kg)  Height: _2  (1.6 m)    GENERAL: The patient is a well-nourished female, in no acute distress. The vital signs are documented above. CARDIAC: There is a regular rate and rhythm.  VASCULAR:  2+ palpable left radial pulse Healing left antecubtal incision  ABDOMEN: Soft and non-tender. MUSCULOSKELETAL: There are no major deformities or cyanosis.   DATA:   N/A  Assessment/Plan:   61 y.o. female that presents for postop check.  She most recently underwent left carotid endarterectomy with a redo left common carotid to brachial artery bypass with PTFE on 08/13/2022 for tissue loss.  This was for an occluded left common carotid to brachial artery bypass with vein in the setting of recurrent tissue loss.    It is too early to remove her staples today since she is only 2 weeks postop.  The arm incision is healing very nicely with no signs of cellulitis.  I did give her an additional one month of antibiotics to treat a presumed graft infection although I still think this was a sterile seroma with breakdown of the skin incision.  We did place antibiotic beads at the time of surgery as well.  I will have her return in 2 weeks to have staples removed in the [facility] clinic and for wound check.  She feels the left 3rd finger gangrene is healing.     Marty Heck, MD [facility] [place] Office: [phone]

## 2022-10-14 ENCOUNTER — Ambulatory Visit (INDEPENDENT_AMBULATORY_CARE_PROVIDER_SITE_OTHER): Payer: BC Managed Care – PPO | Admitting: Physician Assistant

## 2022-10-14 ENCOUNTER — Encounter: Payer: Self-pay | Admitting: Physician Assistant

## 2022-10-14 DIAGNOSIS — T8189XD Other complications of procedures, not elsewhere classified, subsequent encounter: Secondary | ICD-10-CM

## 2022-10-14 DIAGNOSIS — I998 Other disorder of circulatory system: Secondary | ICD-10-CM

## 2022-10-14 DIAGNOSIS — I6523 Occlusion and stenosis of bilateral carotid arteries: Secondary | ICD-10-CM

## 2022-10-14 NOTE — Progress Notes (Signed)
POST OPERATIVE OFFICE NOTE    CC:  F/u for surgery  HPI:  This is a 62 y.o. female who is s/p left carotid endarterectomy with a redo left common carotid artery to brachial artery bypass with PTFE on 08/13/2022 by Dr. Carlis Abbott.  This was for an occluded left common carotid to brachial artery bypass with vein in the setting of recurrent tissue loss. Post op she developed some breakdown of her antecubital incision that required I&D and evacuation of the seroma.  Cultures did grow MSSA.  She completed initial course of Bactrim. At her last visit 2 weeks ago her incision was healing well but Dr. Carlis Abbott gave her an additional 1 month of Bactrim for prophylaxis due to her presumed graft infection. Her left 3rd finger was healing as well. It was too early to remove her staples so she returns today for follow up and staple removal.  Pt returns today for follow up.  Pt states she does not have any pain in her arm or hand. She does have some tenderness along her neck incision to the touch. Her swelling around her neck has resolved. Her 3rd finger has healed. She is still taking Bactrim. She has no other concerns at this time.  Allergies  Allergen Reactions   Promethazine Hcl Shortness Of Breath and Other (See Comments)    Bells palsy   Lisinopril Itching and Swelling    Current Outpatient Medications  Medication Sig Dispense Refill   amLODipine (NORVASC) 5 MG tablet TAKE 1 TABLET BY MOUTH EVERY DAY (Patient taking differently: at bedtime.) 90 tablet 1   amoxicillin-clavulanate (AUGMENTIN) 875-125 MG tablet Take 1 tablet by mouth 2 (two) times daily. 60 tablet 0   aspirin EC 81 MG tablet Take 1 tablet (81 mg total) by mouth daily. Swallow whole. (Patient taking differently: Take 81 mg by mouth at bedtime. Swallow whole.) 150 tablet 2   atorvastatin (LIPITOR) 80 MG tablet Take 0.5 tablets (40 mg total) by mouth daily. (Patient taking differently: Take 80 mg by mouth at bedtime.) 90 tablet 1   Blood Glucose  Monitoring Suppl (ONETOUCH VERIO FLEX SYSTEM) w/Device KIT Use as directed to check blood sugars 2 times per day dx: e11.65 1 kit 1   Cholecalciferol (DIALYVITE VITAMIN D 5000 PO) Take 10,000 Units by mouth at bedtime.     diclofenac Sodium (VOLTAREN) 1 % GEL Use as directed, apply to affected joints tid prn (Patient taking differently: Apply 1 Application topically 3 (three) times daily as needed (joint pain). Use as directed, apply to affected joints tid prn) 150 g 2   empagliflozin (JARDIANCE) 25 MG TABS tablet Take 1 tablet (25 mg total) by mouth daily before breakfast. (Patient taking differently: Take 25 mg by mouth at bedtime.) 90 tablet 1   ferrous sulfate 325 (65 FE) MG tablet TAKE 1 TABLET BY MOUTH EVERY DAY WITH BREAKFAST (Patient taking differently: Take 325 mg by mouth at bedtime.) 90 tablet 1   gabapentin (NEURONTIN) 300 MG capsule TAKE 1 CAPSULE BY MOUTH 3 (THREE) TIMES DAILY. TAKE 2 CAPSULES TO EQUAL 600MG AT NIGHT 180 capsule 2   glucose blood (ONETOUCH VERIO) test strip USE TO CHECK BLOOD SUGAR TWICE A DAY 100 strip 12   insulin degludec (TRESIBA FLEXTOUCH) 200 UNIT/ML FlexTouch Pen Inject 70 Units into the skin every evening. 9 mL 43   Insulin Pen Needle (B-D ULTRAFINE III SHORT PEN) 31G X 8 MM MISC Use as directed with insulin pen 100 each 3   MAGNESIUM  PO Take 1 tablet by mouth daily.     Multiple Vitamin (MULTIVITAMIN WITH MINERALS) TABS tablet Take 1 tablet by mouth daily.     NOVOLOG FLEXPEN 100 UNIT/ML FlexPen INJECT 12 UNITS 3 TIMES PER DAY WITH MEALS NOT TO EXCEED 50 UNITS 15 mL 1   Nutritional Supplements (GRAPESEED EXTRACT PO) Take 1 capsule by mouth daily.     Omega-3 Fatty Acids (FISH OIL PO) Take 1 capsule by mouth in the morning.     omeprazole (PRILOSEC OTC) 20 MG tablet Take 20 mg by mouth daily.     oxyCODONE-acetaminophen (PERCOCET/ROXICET) 5-325 MG tablet Take 1 tablet by mouth every 6 (six) hours as needed for moderate pain. (Patient not taking: Reported on  09/30/2022) 20 tablet 0   Semaglutide, 2 MG/DOSE, (OZEMPIC, 2 MG/DOSE,) 8 MG/3ML SOPN Inject 2 mg into the skin once a week. (Patient taking differently: Inject 2 mg into the skin every Tuesday.) 3 mL 0   sertraline (ZOLOFT) 50 MG tablet TAKE 1 TABLET BY MOUTH EVERY DAY (Patient taking differently: at bedtime.) 90 tablet 0   Turmeric 500 MG CAPS Take 500 mg by mouth at bedtime.     valACYclovir (VALTREX) 500 MG tablet TAKE 1 TABLET (500 MG TOTAL) BY MOUTH IN THE MORNING (Patient taking differently: Take 500 mg by mouth at bedtime.) 90 tablet 1   No current facility-administered medications for this visit.     ROS:  See HPI  Physical Exam: General:very pleasant, in no distress Incision:  left neck incision and subclavian incisions are healed. Left AC incision healing very well. Staples removed Extremities:  Left upper arm well perfused and warm. 2+ left radial pulse. Left hand warm. Left 3rd finger ulceration healed. Very small pinpoint scab present at distal finger tip Neuro: alert and oriented   Assessment/Plan:  This is a 61 y.o. female who is s/p:left carotid endarterectomy with a redo left common carotid to brachial artery bypass with PTFE on 08/13/2022 for tissue loss.  This was for an occluded left common carotid to brachial artery bypass with vein in the setting of recurrent tissue loss of left 3rd finger. Her left 3rd finger has healed. Her bypass remains open with adequate perfusion of left arm. Her incisions have healed nicely.Staples removed today from [facility] incision. Advised her to complete full course of Bactrim. - Continue Aspirin, statin - she knows to follow up earlier if she has any new or concerning symptoms - She will follow up on 12/15/22 with non invasive studies   Leontine Locket, [facility] [phone]   Clinic MD:  Roxanne Mins

## 2022-10-20 ENCOUNTER — Other Ambulatory Visit: Payer: Self-pay | Admitting: Nurse Practitioner

## 2022-10-20 DIAGNOSIS — F419 Anxiety disorder, unspecified: Secondary | ICD-10-CM

## 2022-10-29 LAB — HM MAMMOGRAPHY

## 2022-11-03 ENCOUNTER — Encounter: Payer: Self-pay | Admitting: Internal Medicine

## 2022-11-03 ENCOUNTER — Ambulatory Visit (INDEPENDENT_AMBULATORY_CARE_PROVIDER_SITE_OTHER): Payer: BC Managed Care – PPO | Admitting: Internal Medicine

## 2022-11-03 ENCOUNTER — Other Ambulatory Visit: Payer: Self-pay | Admitting: Internal Medicine

## 2022-11-03 VITALS — BP 128/72 | HR 82 | Temp 98.3°F | Ht 63.0 in | Wt 204.4 lb

## 2022-11-03 DIAGNOSIS — I739 Peripheral vascular disease, unspecified: Secondary | ICD-10-CM | POA: Diagnosis not present

## 2022-11-03 DIAGNOSIS — R252 Cramp and spasm: Secondary | ICD-10-CM

## 2022-11-03 DIAGNOSIS — Z794 Long term (current) use of insulin: Secondary | ICD-10-CM

## 2022-11-03 DIAGNOSIS — N182 Chronic kidney disease, stage 2 (mild): Secondary | ICD-10-CM | POA: Diagnosis not present

## 2022-11-03 DIAGNOSIS — I129 Hypertensive chronic kidney disease with stage 1 through stage 4 chronic kidney disease, or unspecified chronic kidney disease: Secondary | ICD-10-CM

## 2022-11-03 DIAGNOSIS — E6609 Other obesity due to excess calories: Secondary | ICD-10-CM

## 2022-11-03 DIAGNOSIS — Z1211 Encounter for screening for malignant neoplasm of colon: Secondary | ICD-10-CM

## 2022-11-03 DIAGNOSIS — E1122 Type 2 diabetes mellitus with diabetic chronic kidney disease: Secondary | ICD-10-CM

## 2022-11-03 DIAGNOSIS — Z23 Encounter for immunization: Secondary | ICD-10-CM

## 2022-11-03 DIAGNOSIS — Z6836 Body mass index (BMI) 36.0-36.9, adult: Secondary | ICD-10-CM

## 2022-11-03 MED ORDER — MOUNJARO 5 MG/0.5ML ~~LOC~~ SOAJ: 5.0000 mg | SUBCUTANEOUS | 0 refills | Status: DC

## 2022-11-03 NOTE — Progress Notes (Unsigned)
Barnet Glasgow Martin,acting as a Education administrator for [name], MD.,have documented all relevant documentation on the behalf of [name], MD,as directed by  [name], MD while in the presence of [name], MD.    Subjective:     Patient ID: Meagan [name] , female    DOB: [birth year] , 61 y.o.   MRN: [id number]   Chief Complaint  Patient presents with   Diabetes    HPI  Patient presents today for a DM check, patient states compliance with medications. Patient states her right hand cramps up often she first noticed this 2 months ago.  BP Readings from Last 3 Encounters: 11/03/22 : 128/72 09/30/22 : Marland Kitchen 97/49 09/20/22 : (!) 115/54    Diabetes     Past Medical History:  Diagnosis Date   Arthritis    Chronic kidney disease    Complication of anesthesia    Diabetes mellitus (HCC)    GERD (gastroesophageal reflux disease)    Hyperlipidemia    Hypertension    Neuropathy    Pneumonia    PONV (postoperative nausea and vomiting)      Family History  Problem Relation Age of Onset   Kidney failure Mother    Liver disease Mother    Early death Father    Hypertension Brother    Colon cancer Neg Hx    Stomach cancer Neg Hx      Current Outpatient Medications:    amLODipine (NORVASC) 5 MG tablet, TAKE 1 TABLET BY MOUTH EVERY DAY (Patient taking differently: at bedtime.), Disp: 90 tablet, Rfl: 1   aspirin EC 81 MG tablet, Take 1 tablet (81 mg total) by mouth daily. Swallow whole. (Patient taking differently: Take 81 mg by mouth at bedtime. Swallow whole.), Disp: 150 tablet, Rfl: 2   atorvastatin (LIPITOR) 80 MG tablet, Take 0.5 tablets (40 mg total) by mouth daily. (Patient taking differently: Take 80 mg by mouth at bedtime.), Disp: 90 tablet, Rfl: 1   Blood Glucose Monitoring Suppl (ONETOUCH VERIO FLEX SYSTEM) w/Device KIT, Use as directed to check blood sugars 2 times per day dx: e11.65, Disp: 1 kit, Rfl: 1   Cholecalciferol (DIALYVITE VITAMIN D 5000 PO),  Take 10,000 Units by mouth at bedtime., Disp: , Rfl:    diclofenac Sodium (VOLTAREN) 1 % GEL, Use as directed, apply to affected joints tid prn (Patient taking differently: Apply 1 Application topically 3 (three) times daily as needed (joint pain). Use as directed, apply to affected joints tid prn), Disp: 150 g, Rfl: 2   empagliflozin (JARDIANCE) 25 MG TABS tablet, Take 1 tablet (25 mg total) by mouth daily before breakfast. (Patient taking differently: Take 25 mg by mouth at bedtime.), Disp: 90 tablet, Rfl: 1   ferrous sulfate 325 (65 FE) MG tablet, TAKE 1 TABLET BY MOUTH EVERY DAY WITH BREAKFAST (Patient taking differently: Take 325 mg by mouth at bedtime.), Disp: 90 tablet, Rfl: 1   gabapentin (NEURONTIN) 300 MG capsule, TAKE 1 CAPSULE BY MOUTH 3 (THREE) TIMES DAILY. TAKE 2 CAPSULES TO EQUAL 600MG AT NIGHT, Disp: 180 capsule, Rfl: 2   glucose blood (ONETOUCH VERIO) test strip, USE TO CHECK BLOOD SUGAR TWICE A DAY, Disp: 100 strip, Rfl: 12   insulin degludec (TRESIBA FLEXTOUCH) 200 UNIT/ML FlexTouch Pen, Inject 70 Units into the skin every evening., Disp: 9 mL, Rfl: 43   Insulin Pen Needle (B-D ULTRAFINE III SHORT PEN) 31G X 8 MM MISC, Use as directed with insulin pen, Disp: 100 each,  Rfl: 3   MAGNESIUM PO, Take 1 tablet by mouth daily., Disp: , Rfl:    Multiple Vitamin (MULTIVITAMIN WITH MINERALS) TABS tablet, Take 1 tablet by mouth daily., Disp: , Rfl:    NOVOLOG FLEXPEN 100 UNIT/ML FlexPen, INJECT 12 UNITS 3 TIMES PER DAY WITH MEALS NOT TO EXCEED 50 UNITS, Disp: 15 mL, Rfl: 1   Nutritional Supplements (GRAPESEED EXTRACT PO), Take 1 capsule by mouth daily., Disp: , Rfl:    Omega-3 Fatty Acids (FISH OIL PO), Take 1 capsule by mouth in the morning., Disp: , Rfl:    omeprazole (PRILOSEC OTC) 20 MG tablet, Take 20 mg by mouth daily., Disp: , Rfl:    sertraline (ZOLOFT) 50 MG tablet, Take 1 tablet (50 mg total) by mouth at bedtime., Disp: 90 tablet, Rfl: 1   Turmeric 500 MG CAPS, Take 500 mg by mouth  at bedtime., Disp: , Rfl:    valACYclovir (VALTREX) 500 MG tablet, TAKE 1 TABLET (500 MG TOTAL) BY MOUTH IN THE MORNING (Patient taking differently: Take 500 mg by mouth at bedtime.), Disp: 90 tablet, Rfl: 1   oxyCODONE-acetaminophen (PERCOCET/ROXICET) 5-325 MG tablet, Take 1 tablet by mouth every 6 (six) hours as needed for moderate pain. (Patient not taking: Reported on 09/30/2022), Disp: 20 tablet, Rfl: 0   Semaglutide, 2 MG/DOSE, (OZEMPIC, 2 MG/DOSE,) 8 MG/3ML SOPN, Inject 2 mg into the skin once a week. (Patient not taking: Reported on 11/03/2022), Disp: 3 mL, Rfl: 0   Allergies  Allergen Reactions   Promethazine Hcl Shortness Of Breath and Other (See Comments)    Bells palsy   Lisinopril Itching and Swelling     Review of Systems   Today's Vitals   11/03/22 1610  BP: 128/72  Pulse: 82  Temp: 98.3 F (36.8 C)  TempSrc: Oral  Weight: 204 lb 6.4 oz (92.7 kg)  Height: _0  (1.6 m)  PainSc: 0-No pain   Body mass index is 36.21 kg/m.  Wt Readings from Last 3 Encounters:  11/03/22 204 lb 6.4 oz (92.7 kg)  09/30/22 200 lb (90.7 kg)  09/20/22 200 lb 8 oz (90.9 kg)    Objective:  Physical Exam      Assessment And Plan:     1. Type 2 diabetes mellitus with stage 2 chronic kidney disease, with long-term current use of insulin ([place])     Patient was given opportunity to ask questions. Patient verbalized understanding of the plan and was able to repeat key elements of the plan. All questions were answered to their satisfaction.  Meagan [name], [place], Meagan [name], CMA, have reviewed all documentation for this visit. The documentation on 11/03/22 for the exam, diagnosis, procedures, and orders are all accurate and complete.   IF YOU HAVE BEEN REFERRED TO A SPECIALIST, IT MAY TAKE 1-2 WEEKS TO SCHEDULE/PROCESS THE REFERRAL. IF YOU HAVE NOT HEARD FROM US/SPECIALIST IN TWO WEEKS, PLEASE GIVE [place] A CALL AT [phone].   THE PATIENT IS ENCOURAGED TO PRACTICE SOCIAL  DISTANCING DUE TO THE COVID-19 PANDEMIC.

## 2022-11-03 NOTE — Patient Instructions (Signed)

## 2022-11-04 LAB — BMP8+EGFR
BUN/Creatinine Ratio: 28 (ref 12–28)
BUN: 31 mg/dL — ABNORMAL HIGH (ref 8–27)
CO2: 20 mmol/L (ref 20–29)
Calcium: 9.2 mg/dL (ref 8.7–10.3)
Chloride: 103 mmol/L (ref 96–106)
Creatinine, Ser: 1.11 mg/dL — ABNORMAL HIGH (ref 0.57–1.00)
Glucose: 233 mg/dL — ABNORMAL HIGH (ref 70–99)
Potassium: 4.8 mmol/L (ref 3.5–5.2)
Sodium: 139 mmol/L (ref 134–144)
eGFR: 57 mL/min/{1.73_m2} — ABNORMAL LOW (ref >59)

## 2022-11-04 LAB — HEMOGLOBIN A1C
Est. average glucose Bld gHb Est-mCnc: 240 mg/dL
Hgb A1c MFr Bld: 10 % — ABNORMAL HIGH (ref 4.8–5.6)

## 2022-11-10 LAB — HM PAP SMEAR

## 2022-11-11 ENCOUNTER — Encounter: Payer: Self-pay | Admitting: Gastroenterology

## 2022-11-17 ENCOUNTER — Telehealth: Payer: Self-pay | Admitting: *Deleted

## 2022-11-17 NOTE — Telephone Encounter (Signed)
Yesi,   This pt is a documented difficult intubation and his procedure will need to be done at the hospital.   Thanks,  Selyna Klahn 

## 2022-11-18 ENCOUNTER — Other Ambulatory Visit: Payer: Self-pay

## 2022-11-18 DIAGNOSIS — Z1211 Encounter for screening for malignant neoplasm of colon: Secondary | ICD-10-CM

## 2022-11-18 NOTE — Telephone Encounter (Signed)
Scheduled hospital colon with patient for 12/04/22 at 3:15 pm at [facility] with Dr. Fuller Plan. Advised that she keep PV as scheduled for next week. Pt verbalized all understanding.

## 2022-11-18 NOTE — Telephone Encounter (Signed)
Left message for patient to call back  

## 2022-11-18 NOTE — Telephone Encounter (Signed)
Noted on PV chart ?

## 2022-11-18 NOTE — Telephone Encounter (Signed)
Meagan [name],  Can you please find space on Dr. Lynne Leader [facility] schedule for this patient and let me know once you have spoken with this patient as to when you have her scheduled.  Her Pre Visit appt is currently scheduled on 11/26/2022. Thanks  Bre

## 2022-11-25 ENCOUNTER — Encounter (HOSPITAL_COMMUNITY): Payer: Self-pay | Admitting: Gastroenterology

## 2022-11-25 NOTE — Progress Notes (Signed)
Gave patient number for pharmacy with instructions to call to confirm medication list  COVID Vaccine Completed: yes  Date of COVID positive in last 90 days: no  PCP - Glendale Chard, MD Cardiologist - n/a  Chest x-ray - n/a EKG - 08/13/22 Epic Stress Test - n/a ECHO - n/a Cardiac Cath - n/a Pacemaker/ICD device last checked: n/a Spinal Cord Stimulator: n/a  Bowel Prep - no per pt she is waiting for a call from surgeons office that she is expecting today or tomorrow (12/26 or 12/27). Instructed to call surgeon's office is she does not get a call.  Sleep Study - n/a CPAP -   Fasting Blood Sugar - 120-199 Checks Blood Sugar  dexicom CGM A1C 10.0 11/03/22 in Epic  DM medication instructions given to pateint: Take 50% of Tresiba night before surgery Hold Jardiance 72 hours, last dose 11/30/22 Only take Novolog DOS if blood surgar is greater than 220, then take 50% of dose  Last dose of GLP1 agonist-  Mounjaro GLP1 instructions:  hold 7 days last dose 11/24/22, do not take 12/01/22   Last dose of SGLT-2 inhibitors-  N/A SGLT-2 instructions: N/A   Blood Thinner Instructions: Aspirin Instructions: ASA 81, no instructions per pt, she will ask surgeons office Last Dose:  Activity level: Can go up a flight of stairs and perform activities of daily living without stopping and without symptoms of chest pain or shortness of breath.   Anesthesia review: PVD, HTN, DM2, CKD, A1C 10.0, non healing surgical wound, left carotid artery stenosis, heart murmur no hx of ECHO  Patient denies shortness of breath, fever, cough and chest pain at PAT appointment  Patient verbalized understanding of instructions that were given.

## 2022-11-26 ENCOUNTER — Ambulatory Visit (AMBULATORY_SURGERY_CENTER): Payer: BC Managed Care – PPO | Admitting: *Deleted

## 2022-11-26 VITALS — Ht 63.0 in | Wt 205.0 lb

## 2022-11-26 DIAGNOSIS — Z1211 Encounter for screening for malignant neoplasm of colon: Secondary | ICD-10-CM

## 2022-11-26 MED ORDER — NA SULFATE-K SULFATE-MG SULF 17.5-3.13-1.6 GM/177ML PO SOLN: 1.0000 | Freq: Once | ORAL | 0 refills | Status: AC

## 2022-11-26 NOTE — Progress Notes (Signed)
No egg or soy allergy known to patient  No issues known to pt with past sedation with any surgeries or procedures Patient denies ever being told they had issues or difficulty with intubation  No issues moving neck or head No issues with swallowing No FH of Malignant Hyperthermia Pt is not on diet pills Pt is not on  home 02  Pt is not on blood thinners  Pt denies issues with constipation  Pt is not on dialysis Pt denies any upcoming cardiac testing Pt encouraged to use to use Singlecare or Goodrx to reduce cost  Patient's chart reviewed by Osvaldo Angst CNRA prior to previsit and patient appropriate for the [place].  Previsit completed and red dot placed by patient's name on their procedure day (on provider's schedule).  . Pre-visit done via phone Instructions sent by mail witjh coupon Pt to have procedure at [facility] Pt instructed to follow any instructions given by [facility] RN's pt states she will

## 2022-11-27 ENCOUNTER — Encounter: Payer: Self-pay | Admitting: Gastroenterology

## 2022-12-04 ENCOUNTER — Encounter (HOSPITAL_COMMUNITY): Payer: Self-pay | Admitting: Gastroenterology

## 2022-12-04 ENCOUNTER — Other Ambulatory Visit: Payer: Self-pay

## 2022-12-04 ENCOUNTER — Ambulatory Visit (HOSPITAL_COMMUNITY)
Admission: RE | Admit: 2022-12-04 | Discharge: 2022-12-04 | Disposition: A | Discharge status: Home / Self Care | Payer: BC Managed Care – PPO | Attending: Gastroenterology | Admitting: Gastroenterology

## 2022-12-04 ENCOUNTER — Ambulatory Visit (HOSPITAL_COMMUNITY): Payer: BC Managed Care – PPO | Admitting: Anesthesiology

## 2022-12-04 ENCOUNTER — Encounter (HOSPITAL_COMMUNITY)
Admission: RE | Disposition: A | Discharge status: Home / Self Care | Payer: Self-pay | Source: Home / Self Care | Attending: Gastroenterology

## 2022-12-04 DIAGNOSIS — Z7985 Long-term (current) use of injectable non-insulin antidiabetic drugs: Secondary | ICD-10-CM | POA: Diagnosis not present

## 2022-12-04 DIAGNOSIS — Z794 Long term (current) use of insulin: Secondary | ICD-10-CM | POA: Diagnosis not present

## 2022-12-04 DIAGNOSIS — Z1211 Encounter for screening for malignant neoplasm of colon: Secondary | ICD-10-CM | POA: Diagnosis present

## 2022-12-04 DIAGNOSIS — E1122 Type 2 diabetes mellitus with diabetic chronic kidney disease: Secondary | ICD-10-CM | POA: Insufficient documentation

## 2022-12-04 DIAGNOSIS — E1151 Type 2 diabetes mellitus with diabetic peripheral angiopathy without gangrene: Secondary | ICD-10-CM | POA: Diagnosis not present

## 2022-12-04 DIAGNOSIS — D123 Benign neoplasm of transverse colon: Secondary | ICD-10-CM

## 2022-12-04 DIAGNOSIS — Z1212 Encounter for screening for malignant neoplasm of rectum: Secondary | ICD-10-CM

## 2022-12-04 DIAGNOSIS — I129 Hypertensive chronic kidney disease with stage 1 through stage 4 chronic kidney disease, or unspecified chronic kidney disease: Secondary | ICD-10-CM | POA: Diagnosis not present

## 2022-12-04 DIAGNOSIS — N189 Chronic kidney disease, unspecified: Secondary | ICD-10-CM | POA: Diagnosis not present

## 2022-12-04 DIAGNOSIS — Z7984 Long term (current) use of oral hypoglycemic drugs: Secondary | ICD-10-CM | POA: Diagnosis not present

## 2022-12-04 HISTORY — PX: COLONOSCOPY WITH PROPOFOL: SHX5780

## 2022-12-04 HISTORY — PX: POLYPECTOMY: SHX5525

## 2022-12-04 HISTORY — DX: Cardiac murmur, unspecified: R01.1

## 2022-12-04 HISTORY — DX: Peripheral vascular disease, unspecified: I73.9

## 2022-12-04 LAB — GLUCOSE, CAPILLARY: Glucose-Capillary: 118 mg/dL — ABNORMAL HIGH (ref 70–99)

## 2022-12-04 SURGERY — COLONOSCOPY WITH PROPOFOL
Anesthesia: Monitor Anesthesia Care

## 2022-12-04 MED ORDER — PROPOFOL 500 MG/50ML IV EMUL
INTRAVENOUS | Status: AC
  Filled 2022-12-04: qty 150

## 2022-12-04 MED ORDER — PROPOFOL 10 MG/ML IV BOLUS
INTRAVENOUS | Status: DC | PRN
  Administered 2022-12-04 (×2): 20 mg via INTRAVENOUS

## 2022-12-04 MED ORDER — PHENYLEPHRINE 80 MCG/ML (10ML) SYRINGE FOR IV PUSH (FOR BLOOD PRESSURE SUPPORT)
PREFILLED_SYRINGE | INTRAVENOUS | Status: DC | PRN
  Administered 2022-12-04: 160 ug via INTRAVENOUS

## 2022-12-04 MED ORDER — SODIUM CHLORIDE 0.9 % IV SOLN: INTRAVENOUS | Status: DC

## 2022-12-04 MED ORDER — PROPOFOL 500 MG/50ML IV EMUL
INTRAVENOUS | Status: DC | PRN
  Administered 2022-12-04: 125 ug/kg/min via INTRAVENOUS

## 2022-12-04 MED ORDER — LACTATED RINGERS IV SOLN: INTRAVENOUS | Status: DC | PRN

## 2022-12-04 SURGICAL SUPPLY — 22 items

## 2022-12-04 NOTE — Transfer of Care (Signed)
Immediate Anesthesia Transfer of Care Note  Patient: Meagan [name]  Procedure(s) Performed: COLONOSCOPY WITH PROPOFOL POLYPECTOMY  Patient Location: PACU  Anesthesia Type:MAC  Level of Consciousness: sedated  Airway & Oxygen Therapy: Patient Spontanous Breathing and Patient connected to face mask oxygen  Post-op Assessment: Report given to RN and Post -op Vital signs reviewed and stable  Post vital signs: Reviewed and stable  Last Vitals:  Vitals Value Taken Time  BP 105/43 12/04/22 1537  Temp    Pulse 66 12/04/22 1538  Resp 15 12/04/22 1538  SpO2 100 % 12/04/22 1538  Vitals shown include unvalidated device data.  Last Pain:  Vitals:   12/04/22 1446  TempSrc: Temporal  PainSc: 0-No pain      Patients Stated Pain Goal: 3 (07/68/08 8110)  Complications: No notable events documented.

## 2022-12-04 NOTE — Op Note (Signed)
[facility] Patient Name: Meagan [name] Procedure Date: 12/04/2022 MRN: [id number] Attending MD: Ladene Artist , MD, [id number] Date of Birth: 14-Sep-1961 CSN: [id number] Age: 62 Admit Type: Outpatient Procedure:                Colonoscopy Indications:              Screening for colorectal malignant neoplasm Providers:                Pricilla Riffle. Fuller Plan, MD, Jeanella Cara, RN,                            Gloris Ham, Technician Referring MD:             Theda Belfast. Baird Cancer, MD Medicines:                Monitored Anesthesia Care Complications:            No immediate complications. Estimated blood loss:                            None. Estimated Blood Loss:     Estimated blood loss: none. Procedure:                Pre-Anesthesia Assessment:                           - Prior to the procedure, a History and Physical                            was performed, and patient medications and                            allergies were reviewed. The patient's tolerance of                            previous anesthesia was also reviewed. The risks                            and benefits of the procedure and the sedation                            options and risks were discussed with the patient.                            All questions were answered, and informed consent                            was obtained. Prior Anticoagulants: The patient has                            taken no anticoagulant or antiplatelet agents. ASA                            Grade Assessment: III - A patient with severe  systemic disease. After reviewing the risks and                            benefits, the patient was deemed in satisfactory                            condition to undergo the procedure.                           After obtaining informed consent, the colonoscope                            was passed under direct vision. Throughout the                             procedure, the patient's blood pressure, pulse, and                            oxygen saturations were monitored continuously. The                            CF-HQ190L ([id number]) Olympus colonoscope was                            introduced through the anus and advanced to the the                            cecum, identified by the appendiceal orifice. The                            ileocecal valve, appendiceal orifice, and rectum                            were photographed. The quality of the bowel                            preparation was good. The colonoscopy was performed                            without difficulty. The patient tolerated the                            procedure well. Scope In: 3:09:55 PM Scope Out: 3:23:14 PM Scope Withdrawal Time: 0 hours 9 minutes 56 seconds  Total Procedure Duration: 0 hours 13 minutes 19 seconds  Findings:      The perianal and digital rectal examinations were normal.      A 7 mm polyp was found in the transverse colon. The polyp was sessile.       The polyp was removed with a cold snare. Resection and retrieval were       complete.      The exam was otherwise without abnormality on direct and retroflexion       views. Impression:               - One 7 mm polyp  in the transverse colon, removed                            with a cold snare. Resected and retrieved.                           - The examination was otherwise normal on direct                            and retroflexion views. Moderate Sedation:      Not Applicable - Patient had care per Anesthesia. Recommendation:           - Repeat colonoscopy after studies are complete for                            surveillance based on pathology results.                           - Patient has a contact number available for                            emergencies. The signs and symptoms of potential                            delayed complications were discussed with the                             patient. Return to normal activities tomorrow.                            Written discharge instructions were provided to the                            patient.                           - Resume previous diet.                           - Continue present medications.                           - Await pathology results. Procedure Code(s):        --- Professional ---                           [phone], Colonoscopy, flexible; with removal of                            tumor(s), polyp(s), or other lesion(s) by snare                            technique Diagnosis Code(s):        --- Professional ---  Z12.11, Encounter for screening for malignant                            neoplasm of colon                           D12.3, Benign neoplasm of transverse colon (hepatic                            flexure or splenic flexure) CPT copyright 2022 American Medical Association. All rights reserved. The codes documented in this report are preliminary and upon coder review may  be revised to meet current compliance requirements. Ladene Artist, MD 12/04/2022 3:43:19 PM This report has been signed electronically. Number of Addenda: 0

## 2022-12-04 NOTE — H&P (Signed)
History & Physical  Primary Care Physician:  Glendale Chard, MD Primary Gastroenterologist: Lucio Edward, MD  CHIEF COMPLAINT:  CRC screening   HPI: Meagan [name] is a 62 y.o. female CRC screening, average risk, for colonoscopy.    Past Medical History:  Diagnosis Date   Arthritis    Chronic kidney disease    Complication of anesthesia    Diabetes mellitus (HCC)    GERD (gastroesophageal reflux disease)    Heart murmur    when 18 per patient   Hyperlipidemia    Hypertension    Neuropathy    Pneumonia    PONV (postoperative nausea and vomiting)    PVD (peripheral vascular disease) ([place])     Past Surgical History:  Procedure Laterality Date   AORTIC ARCH ANGIOGRAPHY N/A 08/01/2021   Procedure: AORTIC ARCH ANGIOGRAPHY;  Surgeon: Marty Heck, MD;  Location: [place] [facility];  Service: Cardiovascular;  Laterality: N/A;   AORTIC ARCH ANGIOGRAPHY N/A 08/07/2022   Procedure: AORTIC ARCH ANGIOGRAPHY;  Surgeon: Marty Heck, MD;  Location: [place] [facility];  Service: Cardiovascular;  Laterality: N/A;   CAROTID-SUBCLAVIAN BYPASS GRAFT Left 08/12/2021   Procedure: LEFT CAROTID-BRACHIAL ARTERY BYPASS using Left greater saphenous Vein, harvested from left leg.;  Surgeon: Marty Heck, MD;  Location: [facility] OR;  Service: Vascular;  Laterality: Left;   CAROTID-SUBCLAVIAN BYPASS GRAFT Left 08/13/2022   Procedure: REDO LEFT COMMON CAROTID-BRACHIAL ARTERY BYPASS USING PROPATEN GRAFT.;  Surgeon: Marty Heck, MD;  Location: [place];  Service: Vascular;  Laterality: Left;   CARPAL TUNNEL RELEASE Left 01/31/2021   CARPAL TUNNEL RELEASE Right 2021   Dr. Mardelle Matte   DILATION AND CURETTAGE OF UTERUS  2007   ENDARTERECTOMY Left 08/13/2022   Procedure: ENDARTERECTOMY CAROTID WITH BOVINE PATCH.;  Surgeon: Marty Heck, MD;  Location: [place];  Service: Vascular;  Laterality: Left;   INCISION AND DRAINAGE Left 09/17/2022   Procedure: INCISION AND DRAINAGE LEFT ARM;   Surgeon: Marty Heck, MD;  Location: [place];  Service: Vascular;  Laterality: Left;   ROTATOR CUFF REPAIR Right 2018   Dr. Mardelle Matte   TUBAL LIGATION  1992   UPPER EXTREMITY ANGIOGRAPHY Left 08/01/2021   Procedure: UPPER EXTREMITY ANGIOGRAPHY;  Surgeon: Marty Heck, MD;  Location: [place] [facility];  Service: Cardiovascular;  Laterality: Left;   UPPER EXTREMITY ANGIOGRAPHY Left 08/07/2022   Procedure: Upper Extremity Angiography;  Surgeon: Marty Heck, MD;  Location: [place] [facility];  Service: Cardiovascular;  Laterality: Left;    Prior to Admission medications   Medication Sig Start Date End Date Taking? Authorizing Provider  amLODipine (NORVASC) 5 MG tablet TAKE 1 TABLET BY MOUTH EVERY DAY Patient taking differently: Take 5 mg by mouth at bedtime. 08/18/22  Yes Glendale Chard, MD  aspirin EC 81 MG tablet Take 1 tablet (81 mg total) by mouth daily. Swallow whole. Patient taking differently: Take 81 mg by mouth at bedtime. Swallow whole. 08/07/22 08/07/23 Yes Marty Heck, MD  atorvastatin (LIPITOR) 80 MG tablet Take 0.5 tablets (40 mg total) by mouth daily. Patient taking differently: Take 80 mg by mouth at bedtime. 04/11/22  Yes Glendale Chard, MD  Cholecalciferol (DIALYVITE VITAMIN D 5000 PO) Take 10,000 Units by mouth at bedtime.   Yes [provider]  diclofenac Sodium (VOLTAREN) 1 % GEL Use as directed, apply to affected joints tid prn Patient taking differently: Apply 1 Application topically 3 (three) times daily as needed (joint pain). Use as  directed, apply to affected joints tid prn 03/17/22  Yes Glendale Chard, MD  empagliflozin (JARDIANCE) 25 MG TABS tablet Take 1 tablet (25 mg total) by mouth daily before breakfast. Patient taking differently: Take 25 mg by mouth at bedtime. 08/26/22  Yes Glendale Chard, MD  ferrous sulfate 325 (65 FE) MG tablet TAKE 1 TABLET BY MOUTH EVERY DAY WITH BREAKFAST Patient taking differently: Take 325 mg by mouth at  bedtime. 07/26/21  Yes Ghumman, Ramandeep, NP  gabapentin (NEURONTIN) 300 MG capsule TAKE 1 CAPSULE BY MOUTH 3 (THREE) TIMES DAILY. TAKE 2 CAPSULES TO EQUAL 600MG AT NIGHT 09/16/22  Yes Glendale Chard, MD  insulin degludec (TRESIBA FLEXTOUCH) 200 UNIT/ML FlexTouch Pen Inject 70 Units into Meagan skin every evening. 09/16/22  Yes Glendale Chard, MD  MAGNESIUM PO Take 1 tablet by mouth daily.   Yes [provider]  Multiple Vitamin (MULTIVITAMIN WITH MINERALS) TABS tablet Take 1 tablet by mouth daily.   Yes [provider]  NOVOLOG FLEXPEN 100 UNIT/ML FlexPen INJECT 12 UNITS 3 TIMES PER DAY WITH MEALS NOT TO EXCEED 50 UNITS 08/25/22  Yes Glendale Chard, MD  nystatin-triamcinolone (MYCOLOG II) cream APPLY TO Meagan AFFECTED AREAS 2 TIMES PER DAY IN Meagan MORNING AND EVENING FOR 2 WEEKS AS NEEDED 10/29/22  Yes [provider]  Omega-3 Fatty Acids (FISH OIL PO) Take 1 capsule by mouth in Meagan morning.   Yes [provider]  omeprazole (PRILOSEC OTC) 20 MG tablet Take 20 mg by mouth daily. 08/23/12  Yes [provider]  sertraline (ZOLOFT) 50 MG tablet Take 1 tablet (50 mg total) by mouth at bedtime. 10/20/22  Yes Minette Brine, FNP  tirzepatide [facility]) 5 MG/0.5ML Pen Inject 5 mg into Meagan skin once a week. 11/03/22  Yes Glendale Chard, MD  Turmeric 500 MG CAPS Take 500 mg by mouth at bedtime.   Yes [provider]  valACYclovir (VALTREX) 500 MG tablet TAKE 1 TABLET (500 MG TOTAL) BY MOUTH IN Meagan MORNING Patient taking differently: Take 500 mg by mouth at bedtime. 07/07/22  Yes Glendale Chard, MD  Blood Glucose Monitoring Suppl ([place]) w/Device KIT Use as directed to check blood sugars 2 times per day dx: e11.65 04/09/21   Glendale Chard, MD  Continuous Blood Gluc Sensor (DEXCOM G7 SENSOR) MISC USE FOR CONTINUOUS BLOOD GLUCOSE MONITORING,REPLACE EVERY 10 DAYS 11/16/22   [provider]  glucose blood (ONETOUCH VERIO) test strip USE TO  CHECK BLOOD SUGAR TWICE A DAY 09/12/21   Minette Brine, FNP  Insulin Pen Needle (B-D ULTRAFINE III SHORT PEN) 31G X 8 MM MISC Use as directed with insulin pen 08/08/22   Glendale Chard, MD  oxyCODONE-acetaminophen (PERCOCET/ROXICET) 5-325 MG tablet Take 1 tablet by mouth every 6 (six) hours as needed for moderate pain. Patient not taking: Reported on 09/30/2022 09/20/22   Karoline Caldwell, PA-C    No current facility-administered medications for this encounter.    Allergies as of 11/18/2022 - Review Complete 11/03/2022  Allergen Reaction Noted   Promethazine hcl Shortness Of Breath and Other (See Comments) 07/16/2021   Lisinopril Itching and Swelling 09/12/2020    Family History  Problem Relation Age of Onset   Kidney failure Mother    Liver disease Mother    Early death Father    Hypertension Brother    Colon cancer Neg Hx    Stomach cancer Neg Hx    Colon polyps Neg Hx    Esophageal cancer Neg Hx  Ulcerative colitis Neg Hx     Social History   Socioeconomic History   Marital status: Single    Spouse name: Not on file   Number of children: Not on file   Years of education: Not on file   Highest education level: Not on file  Occupational History   Not on file  Tobacco Use   Smoking status: Never   Smokeless tobacco: Never  Vaping Use   Vaping Use: Never used  Substance and Sexual Activity   Alcohol use: Yes    Comment: occasional   Drug use: Never   Sexual activity: Not on file  Other Topics Concern   Not on file  Social History Narrative   Not on file   Social Determinants of Health   Financial Resource Strain: Not on file  Food Insecurity: Not on file  Transportation Needs: Not on file  Physical Activity: Not on file  Stress: Not on file  Social Connections: Not on file  Intimate Partner Violence: Not on file    Review of Systems:  All systems reviewed were negative except where noted in HPI.   Physical Exam: Vital signs in last 24 hours:    General:  Alert, well-developed, in NAD Head:  Normocephalic and atraumatic. Eyes:  Sclera clear, no icterus.   Conjunctiva pink. Ears:  Normal auditory acuity. Mouth:  No deformity or lesions.  Neck:  Supple; no masses . Lungs:  Clear throughout to auscultation.   No wheezes, crackles, or rhonchi. No acute distress. Heart:  Regular rate and rhythm; no murmurs. Abdomen:  Soft, nondistended, nontender. No masses, hepatomegaly. No obvious masses.  Normal bowel .    Rectal:  Deferred   Msk:  Symmetrical without gross deformities.. Pulses:  Normal pulses noted. Extremities:  Without edema. Neurologic:  Alert and  oriented x4;  grossly normal neurologically. Skin:  Intact without significant lesions or rashes. Psych:  Alert and cooperative. Normal mood and affect.   Impression / Plan:   CRC screening, average risk, for colonoscopy.   Meagan [name]. Fuller Plan  12/04/2022, 2:10 PM See Shea Evans, [facility], to contact our on call provider

## 2022-12-04 NOTE — Anesthesia Preprocedure Evaluation (Signed)
Anesthesia Evaluation  Patient identified by MRN, date of birth, ID band Patient awake    Reviewed: Allergy & Precautions, H&P , NPO status , Patient's Chart, lab work & pertinent test results  Airway Mallampati: II  TM Distance: >3 FB Neck ROM: Full    Dental no notable dental hx.    Pulmonary neg pulmonary ROS   Pulmonary exam normal breath sounds clear to auscultation       Cardiovascular hypertension, + Peripheral Vascular Disease  Normal cardiovascular exam Rhythm:Regular Rate:Normal     Neuro/Psych negative neurological ROS  negative psych ROS   GI/Hepatic negative GI ROS, Neg liver ROS,,,  Endo/Other  diabetes, Insulin Dependent    Renal/GU Renal InsufficiencyRenal disease  negative genitourinary   Musculoskeletal negative musculoskeletal ROS (+)    Abdominal   Peds negative pediatric ROS (+)  Hematology negative hematology ROS (+)   Anesthesia Other Findings   Reproductive/Obstetrics negative OB ROS                             Anesthesia Physical Anesthesia Plan  ASA: 3  Anesthesia Plan: MAC   Post-op Pain Management:    Induction: Intravenous  PONV Risk Score and Plan: 2 and Propofol infusion and Treatment may vary due to age or medical condition  Airway Management Planned: Simple Face Mask  Additional Equipment:   Intra-op Plan:   Post-operative Plan:   Informed Consent: I have reviewed the patients History and Physical, chart, labs and discussed the procedure including the risks, benefits and alternatives for the proposed anesthesia with the patient or authorized representative who has indicated his/her understanding and acceptance.     Dental advisory given  Plan Discussed with: CRNA and Surgeon  Anesthesia Plan Comments:        Anesthesia Quick Evaluation

## 2022-12-04 NOTE — Discharge Instructions (Signed)
YOU HAD AN ENDOSCOPIC PROCEDURE TODAY: Refer to the procedure report and other information in the discharge instructions given to you for any specific questions about what was found during the examination. If this information does not answer your questions, please call [facility]  office at [phone] to clarify.  ° °YOU SHOULD EXPECT: Some feelings of bloating in the abdomen. Passage of more gas than usual. Walking can help get rid of the air that was put into your GI tract during the procedure and reduce the bloating. If you had a lower endoscopy (such as a colonoscopy or flexible sigmoidoscopy) you may notice spotting of blood in your stool or on the toilet paper. Some abdominal soreness may be present for a day or two, also. ° °DIET: Your first meal following the procedure should be a light meal and then it is ok to progress to your normal diet. A half-sandwich or bowl of soup is an example of a good first meal. Heavy or fried foods are harder to digest and may make you feel nauseous or bloated. Drink plenty of fluids but you should avoid alcoholic beverages for 24 hours. If you had a esophageal dilation, please see attached instructions for diet.   ° °ACTIVITY: Your care partner should take you home directly after the procedure. You should plan to take it easy, moving slowly for the rest of the day. You can resume normal activity the day after the procedure however YOU SHOULD NOT DRIVE, use power tools, machinery or perform tasks that involve climbing or major physical exertion for 24 hours (because of the sedation medicines used during the test).  ° °SYMPTOMS TO REPORT IMMEDIATELY: °A gastroenterologist can be reached at any hour. Please call [phone]  for any of the following symptoms:  °Following lower endoscopy (colonoscopy, flexible sigmoidoscopy) °Excessive amounts of blood in the stool  °Significant tenderness, worsening of abdominal pains  °Swelling of the abdomen that is new, acute  °Fever of 100° or  higher  °Following upper endoscopy (EGD, EUS, ERCP, esophageal dilation) °Vomiting of blood or coffee ground material  °New, significant abdominal pain  °New, significant chest pain or pain under the shoulder blades  °Painful or persistently difficult swallowing  °New shortness of breath  °Black, tarry-looking or red, bloody stools ° °FOLLOW UP:  °If any biopsies were taken you will be contacted by phone or by letter within the next 1-3 weeks. Call [phone]  if you have not heard about the biopsies in 3 weeks.  °Please also call with any specific questions about appointments or follow up tests. ° °

## 2022-12-08 ENCOUNTER — Encounter (HOSPITAL_COMMUNITY): Payer: Self-pay | Admitting: Gastroenterology

## 2022-12-08 ENCOUNTER — Encounter: Payer: Self-pay | Admitting: Gastroenterology

## 2022-12-08 LAB — SURGICAL PATHOLOGY

## 2022-12-09 NOTE — Anesthesia Postprocedure Evaluation (Signed)
Anesthesia Post Note  Patient: Meagan [name]  Procedure(s) Performed: COLONOSCOPY WITH PROPOFOL POLYPECTOMY     Patient location during evaluation: PACU Anesthesia Type: MAC Level of consciousness: awake and alert Pain management: pain level controlled Vital Signs Assessment: post-procedure vital signs reviewed and stable Respiratory status: spontaneous breathing, nonlabored ventilation, respiratory function stable and patient connected to nasal cannula oxygen Cardiovascular status: stable and blood pressure returned to baseline Postop Assessment: no apparent nausea or vomiting Anesthetic complications: no  No notable events documented.  Last Vitals:  Vitals:   12/04/22 1550 12/04/22 1552  BP:    Pulse: 71 71  Resp: 13 13  Temp:    SpO2: 95% 96%    Last Pain:  Vitals:   12/04/22 1535  TempSrc: Temporal  PainSc: 0-No pain                 Rael Tilly S

## 2022-12-15 ENCOUNTER — Encounter: Payer: Self-pay | Admitting: Physician Assistant

## 2022-12-15 ENCOUNTER — Ambulatory Visit (INDEPENDENT_AMBULATORY_CARE_PROVIDER_SITE_OTHER): Payer: BC Managed Care – PPO | Admitting: Physician Assistant

## 2022-12-15 ENCOUNTER — Ambulatory Visit (INDEPENDENT_AMBULATORY_CARE_PROVIDER_SITE_OTHER)
Admission: RE | Admit: 2022-12-15 | Discharge: 2022-12-15 | Disposition: A | Discharge status: Home / Self Care | Payer: BC Managed Care – PPO | Source: Ambulatory Visit | Attending: Surgery | Admitting: Surgery

## 2022-12-15 ENCOUNTER — Ambulatory Visit (HOSPITAL_COMMUNITY)
Admission: RE | Admit: 2022-12-15 | Discharge: 2022-12-15 | Disposition: A | Discharge status: Home / Self Care | Payer: BC Managed Care – PPO | Source: Ambulatory Visit | Attending: Surgery | Admitting: Surgery

## 2022-12-15 VITALS — BP 100/62 | HR 77 | Temp 97.4°F | Resp 14 | Ht 63.0 in | Wt 202.0 lb

## 2022-12-15 DIAGNOSIS — I998 Other disorder of circulatory system: Secondary | ICD-10-CM

## 2022-12-15 DIAGNOSIS — I6523 Occlusion and stenosis of bilateral carotid arteries: Secondary | ICD-10-CM | POA: Insufficient documentation

## 2022-12-15 DIAGNOSIS — I739 Peripheral vascular disease, unspecified: Secondary | ICD-10-CM

## 2022-12-15 DIAGNOSIS — T827XXA Infection and inflammatory reaction due to other cardiac and vascular devices, implants and grafts, initial encounter: Secondary | ICD-10-CM

## 2022-12-15 DIAGNOSIS — T8189XD Other complications of procedures, not elsewhere classified, subsequent encounter: Secondary | ICD-10-CM

## 2022-12-15 DIAGNOSIS — I6522 Occlusion and stenosis of left carotid artery: Secondary | ICD-10-CM

## 2022-12-15 NOTE — Progress Notes (Signed)
Office Note     CC:  follow up Requesting Provider:  Glendale Chard, MD  HPI: Meagan [name] is a 62 y.o. (05-31-1961) female who presents for follow up after left carotid endarterectomy with a redo left common carotid artery to brachial artery bypass with PTFE on 08/13/2022 by Dr. Carlis Abbott.  This was for an occluded left common carotid to brachial artery bypass with vein in the setting of recurrent tissue loss. Post op she developed some breakdown of her antecubital incision that required I&D and evacuation of the seroma. She was found to have MSSA on surgical cultures. She was treated with 1.5 months of Bactrim. At her last visit her incisions were healing well and her left arm was well perfused and warm. She was healing her left 3rd finger wound as well.  Today she reports that her left arm is feeling good. Her wounds are all healed. She has no new wounds. She does have inability to flex her 2nd finger. This has been present for over 1 year since she initially had a wound overlying the PIP joint. Otherwise her AC incision has healed well. No pain or swelling in the left arm. She is without any visual changes, slurred speech, facial drooping, unilateral upper or lower extremity weakness or numbness  She does have known PAD as well. She can ambulate approximately 250 -300 yards before she develops bilateral calf pain. She feels this has improved slightly as she has been trying to walk more. She has no rest pain or tissue loss.  ABIs were obtained on 07/01/2021 that showed dampened monophasic flow at the ankle.   The pt is on a statin for cholesterol management.  The pt is on a daily aspirin.   Other AC: none The pt is on CCB for hypertension.   The pt is diabetic.  Tobacco hx:  never  Past Medical History:  Diagnosis Date   Arthritis    Chronic kidney disease    Complication of anesthesia    Diabetes mellitus (HCC)    GERD (gastroesophageal reflux disease)    Heart murmur    when 18 per  patient   Hyperlipidemia    Hypertension    Neuropathy    Pneumonia    PONV (postoperative nausea and vomiting)    PVD (peripheral vascular disease) ([place])     Past Surgical History:  Procedure Laterality Date   AORTIC ARCH ANGIOGRAPHY N/A 08/01/2021   Procedure: AORTIC ARCH ANGIOGRAPHY;  Surgeon: Marty Heck, MD;  Location: [facility];  Service: Cardiovascular;  Laterality: N/A;   AORTIC ARCH ANGIOGRAPHY N/A 08/07/2022   Procedure: AORTIC ARCH ANGIOGRAPHY;  Surgeon: Marty Heck, MD;  Location: [facility];  Service: Cardiovascular;  Laterality: N/A;   CAROTID-SUBCLAVIAN BYPASS GRAFT Left 08/12/2021   Procedure: LEFT CAROTID-BRACHIAL ARTERY BYPASS using Left greater saphenous Vein, harvested from left leg.;  Surgeon: Marty Heck, MD;  Location: [facility];  Service: Vascular;  Laterality: Left;   CAROTID-SUBCLAVIAN BYPASS GRAFT Left 08/13/2022   Procedure: REDO LEFT COMMON CAROTID-BRACHIAL ARTERY BYPASS USING PROPATEN GRAFT.;  Surgeon: Marty Heck, MD;  Location: [facility];  Service: Vascular;  Laterality: Left;   CARPAL TUNNEL RELEASE Left 01/31/2021   CARPAL TUNNEL RELEASE Right 2021   Dr. Mardelle Matte   COLONOSCOPY WITH PROPOFOL N/A 12/04/2022   Procedure: COLONOSCOPY WITH PROPOFOL;  Surgeon: Ladene Artist, MD;  Location: [facility];  Service: Gastroenterology;  Laterality: N/A;   DILATION AND CURETTAGE OF UTERUS  2007  ENDARTERECTOMY Left 08/13/2022   Procedure: ENDARTERECTOMY CAROTID WITH BOVINE PATCH.;  Surgeon: Marty Heck, MD;  Location: [facility];  Service: Vascular;  Laterality: Left;   INCISION AND DRAINAGE Left 09/17/2022   Procedure: INCISION AND DRAINAGE LEFT ARM;  Surgeon: Marty Heck, MD;  Location: [facility];  Service: Vascular;  Laterality: Left;   POLYPECTOMY  12/04/2022   Procedure: POLYPECTOMY;  Surgeon: Ladene Artist, MD;  Location: [facility];  Service: Gastroenterology;;   ROTATOR CUFF REPAIR Right 2018   Dr. Mardelle Matte    TUBAL LIGATION  1992   UPPER EXTREMITY ANGIOGRAPHY Left 08/01/2021   Procedure: UPPER EXTREMITY ANGIOGRAPHY;  Surgeon: Marty Heck, MD;  Location: [facility];  Service: Cardiovascular;  Laterality: Left;   UPPER EXTREMITY ANGIOGRAPHY Left 08/07/2022   Procedure: Upper Extremity Angiography;  Surgeon: Marty Heck, MD;  Location: [facility];  Service: Cardiovascular;  Laterality: Left;    Social History   Socioeconomic History   Marital status: Single    Spouse name: Not on file   Number of children: Not on file   Years of education: Not on file   Highest education level: Not on file  Occupational History   Not on file  Tobacco Use   Smoking status: Never   Smokeless tobacco: Never  Vaping Use   Vaping Use: Never used  Substance and Sexual Activity   Alcohol use: Yes    Comment: occasional   Drug use: Never   Sexual activity: Not on file  Other Topics Concern   Not on file  Social History Narrative   Not on file   Social Determinants of Health   Financial Resource Strain: Not on file  Food Insecurity: Not on file  Transportation Needs: Not on file  Physical Activity: Not on file  Stress: Not on file  Social Connections: Not on file  Intimate Partner Violence: Not on file    Family History  Problem Relation Age of Onset   Kidney failure Mother    Liver disease Mother    Early death Father    Hypertension Brother    Colon cancer Neg Hx    Stomach cancer Neg Hx    Colon polyps Neg Hx    Esophageal cancer Neg Hx    Ulcerative colitis Neg Hx     Current Outpatient Medications  Medication Sig Dispense Refill   amLODipine (NORVASC) 5 MG tablet TAKE 1 TABLET BY MOUTH EVERY DAY (Patient taking differently: Take 5 mg by mouth at bedtime.) 90 tablet 1   aspirin EC 81 MG tablet Take 1 tablet (81 mg total) by mouth daily. Swallow whole. (Patient taking differently: Take 81 mg by mouth at bedtime. Swallow whole.) 150 tablet 2   atorvastatin  (LIPITOR) 80 MG tablet Take 0.5 tablets (40 mg total) by mouth daily. (Patient taking differently: Take 80 mg by mouth at bedtime.) 90 tablet 1   Blood Glucose Monitoring Suppl (ONETOUCH VERIO FLEX SYSTEM) w/Device KIT Use as directed to check blood sugars 2 times per day dx: e11.65 1 kit 1   Cholecalciferol (DIALYVITE VITAMIN D 5000 PO) Take 10,000 Units by mouth at bedtime.     Continuous Blood Gluc Sensor (DEXCOM G7 SENSOR) MISC USE FOR CONTINUOUS BLOOD GLUCOSE MONITORING,REPLACE EVERY 10 DAYS     diclofenac Sodium (VOLTAREN) 1 % GEL Use as directed, apply to affected joints tid prn (Patient taking differently: Apply 1 Application topically 3 (three) times daily as needed (joint pain). Use  as directed, apply to affected joints tid prn) 150 g 2   empagliflozin (JARDIANCE) 25 MG TABS tablet Take 1 tablet (25 mg total) by mouth daily before breakfast. (Patient taking differently: Take 25 mg by mouth at bedtime.) 90 tablet 1   ferrous sulfate 325 (65 FE) MG tablet TAKE 1 TABLET BY MOUTH EVERY DAY WITH BREAKFAST (Patient taking differently: Take 325 mg by mouth at bedtime.) 90 tablet 1   gabapentin (NEURONTIN) 300 MG capsule TAKE 1 CAPSULE BY MOUTH 3 (THREE) TIMES DAILY. TAKE 2 CAPSULES TO EQUAL '600MG'$  AT NIGHT 180 capsule 2   glucose blood (ONETOUCH VERIO) test strip USE TO CHECK BLOOD SUGAR TWICE A DAY 100 strip 12   insulin degludec (TRESIBA FLEXTOUCH) 200 UNIT/ML FlexTouch Pen Inject 70 Units into the skin every evening. 9 mL 43   Insulin Pen Needle (B-D ULTRAFINE III SHORT PEN) 31G X 8 MM MISC Use as directed with insulin pen 100 each 3   MAGNESIUM PO Take 1 tablet by mouth daily.     Multiple Vitamin (MULTIVITAMIN WITH MINERALS) TABS tablet Take 1 tablet by mouth daily.     NOVOLOG FLEXPEN 100 UNIT/ML FlexPen INJECT 12 UNITS 3 TIMES PER DAY WITH MEALS NOT TO EXCEED 50 UNITS 15 mL 1   nystatin-triamcinolone (MYCOLOG II) cream APPLY TO THE AFFECTED AREAS 2 TIMES PER DAY IN THE MORNING AND EVENING FOR  2 WEEKS AS NEEDED     Omega-3 Fatty Acids (FISH OIL PO) Take 1 capsule by mouth in the morning.     omeprazole (PRILOSEC OTC) 20 MG tablet Take 20 mg by mouth daily.     oxyCODONE-acetaminophen (PERCOCET/ROXICET) 5-325 MG tablet Take 1 tablet by mouth every 6 (six) hours as needed for moderate pain. (Patient not taking: Reported on 09/30/2022) 20 tablet 0   sertraline (ZOLOFT) 50 MG tablet Take 1 tablet (50 mg total) by mouth at bedtime. 90 tablet 1   tirzepatide (MOUNJARO) 5 MG/0.5ML Pen Inject 5 mg into the skin once a week. 2 mL 0   Turmeric 500 MG CAPS Take 500 mg by mouth at bedtime.     valACYclovir (VALTREX) 500 MG tablet TAKE 1 TABLET (500 MG TOTAL) BY MOUTH IN THE MORNING (Patient taking differently: Take 500 mg by mouth at bedtime.) 90 tablet 1   No current facility-administered medications for this visit.    Allergies  Allergen Reactions   Promethazine Hcl Shortness Of Breath and Other (See Comments)    Bells palsy   Lisinopril Itching and Swelling     REVIEW OF SYSTEMS:  '[X]'$  denotes positive finding, '[ ]'$  denotes negative finding Cardiac  Comments:  Chest pain or chest pressure:    Shortness of breath upon exertion:    Short of breath when lying flat:    Irregular heart rhythm:        Vascular    Pain in calf, thigh, or hip brought on by ambulation:    Pain in feet at night that wakes you up from your sleep:     Blood clot in your veins:    Leg swelling:         Pulmonary    Oxygen at home:    Productive cough:     Wheezing:         Neurologic    Sudden weakness in arms or legs:     Sudden numbness in arms or legs:     Sudden onset of difficulty speaking or slurred speech:  Temporary loss of vision in one eye:     Problems with dizziness:         Gastrointestinal    Blood in stool:     Vomited blood:         Genitourinary    Burning when urinating:     Blood in urine:        Psychiatric    Major depression:         Hematologic    Bleeding  problems:    Problems with blood clotting too easily:        Skin    Rashes or ulcers:        Constitutional    Fever or chills:      PHYSICAL EXAMINATION:  Vitals:   12/15/22 1355  Resp: 14  Weight: 202 lb (91.6 kg)  Height: '5\' 3"'$  (1.6 m)    General:  WDWN in NAD; vital signs documented above Gait: Normal HENT: WNL, normocephalic Pulmonary: normal non-labored breathing , without wheezing Cardiac: regular HR, without  Murmurs with carotid bruit Abdomen: soft, NT, no masses Vascular Exam/Pulses:Palpable brachial, radial pulses left  upper extremity. 2nd finger and 3rd finger ulcerations well healed. No other tissue loss  Right Left  Radial 2+ (normal) 2+ (normal)  Femoral 2+ (normal) 2+ (normal)  Popliteal absent absent  DP absent absent  PT absent absent   Extremities: without ischemic changes, without Gangrene , without cellulitis; without open wounds;  Musculoskeletal: no muscle wasting or atrophy  Neurologic: A&O X 3;  No focal weakness or paresthesias are detected Psychiatric:  The pt has Normal affect.   Non-Invasive Vascular Imaging:   VAS [place] Upper extremity arterial duplex: Summary:   Left: Patent carotid-brachial bypass graft with no visualized stenosis.   VAS US Carotid: Summary:  Right Carotid: Velocities in the right ICA are consistent with a 1-39% stenosis.   Left Carotid: Velocities in the left ICA are consistent with a 1-39% stenosis.   Vertebrals: Bilateral vertebral arteries demonstrate antegrade flow.  Subclavians: Normal flow hemodynamics were seen in the right subclavian artery.    ASSESSMENT/PLAN:: 62 y.o. female here for follow up for left carotid endarterectomy with a redo left common carotid artery to brachial artery bypass with PTFE on 08/13/2022 by Dr. Carlis Abbott. Post operatively her course was complicated by infected Seroma with MSSA. She completed 1.5 months of bactrim. Her wounds are all well healed. She is not having any left upper arm  troubles.  - Known PAD. Presently continues to have claudication symptoms but not lifestyle limiting. Encourage her to continue walking therapy and medical management at this time. I will repeat her ABI in 6 months - Duplex shows patent left carotid brachial bypass graft. Carotid duplex shows 1-39% bilaterally. Normal flow in her vertebral arteries and the right subclavian artery.  -Continue Aspirin, Statin - She knows to call for earlier follow up should she develops new or concerning symptoms - She will follow up 6 months with repeat carotid duplex, left upper extremity arterial duplex, and ABI  Meagan Caldwell, PA-C [facility] [phone]  Clinic MD:   Trula Slade

## 2022-12-17 ENCOUNTER — Other Ambulatory Visit: Payer: Self-pay

## 2022-12-17 DIAGNOSIS — I6523 Occlusion and stenosis of bilateral carotid arteries: Secondary | ICD-10-CM

## 2022-12-17 DIAGNOSIS — I739 Peripheral vascular disease, unspecified: Secondary | ICD-10-CM

## 2022-12-17 DIAGNOSIS — I998 Other disorder of circulatory system: Secondary | ICD-10-CM

## 2022-12-24 LAB — HM MAMMOGRAPHY

## 2022-12-26 ENCOUNTER — Encounter: Payer: BC Managed Care – PPO | Admitting: Gastroenterology

## 2023-01-02 ENCOUNTER — Other Ambulatory Visit: Payer: Self-pay | Admitting: Internal Medicine

## 2023-01-15 ENCOUNTER — Other Ambulatory Visit: Payer: Self-pay | Admitting: Internal Medicine

## 2023-01-15 DIAGNOSIS — E1122 Type 2 diabetes mellitus with diabetic chronic kidney disease: Secondary | ICD-10-CM

## 2023-01-15 DIAGNOSIS — M79642 Pain in left hand: Secondary | ICD-10-CM

## 2023-01-19 ENCOUNTER — Other Ambulatory Visit: Payer: Self-pay

## 2023-01-19 MED ORDER — MOUNJARO 5 MG/0.5ML ~~LOC~~ SOAJ: 5.0000 mg | SUBCUTANEOUS | 0 refills | Status: DC

## 2023-01-24 ENCOUNTER — Encounter (HOSPITAL_COMMUNITY): Payer: Self-pay

## 2023-01-24 ENCOUNTER — Ambulatory Visit (HOSPITAL_COMMUNITY)
Admission: EM | Admit: 2023-01-24 | Discharge: 2023-01-24 | Disposition: A | Discharge status: Home / Self Care | Payer: BC Managed Care – PPO | Attending: Emergency Medicine | Admitting: Emergency Medicine

## 2023-01-24 ENCOUNTER — Ambulatory Visit (INDEPENDENT_AMBULATORY_CARE_PROVIDER_SITE_OTHER): Payer: BC Managed Care – PPO

## 2023-01-24 DIAGNOSIS — J069 Acute upper respiratory infection, unspecified: Secondary | ICD-10-CM

## 2023-01-24 DIAGNOSIS — R059 Cough, unspecified: Secondary | ICD-10-CM | POA: Diagnosis not present

## 2023-01-24 DIAGNOSIS — Z20822 Contact with and (suspected) exposure to covid-19: Secondary | ICD-10-CM | POA: Diagnosis not present

## 2023-01-24 MED ORDER — ALBUTEROL SULFATE HFA 108 (90 BASE) MCG/ACT IN AERS
2.0000 | INHALATION_SPRAY | Freq: Once | RESPIRATORY_TRACT | Status: AC
  Administered 2023-01-24: 2 via RESPIRATORY_TRACT

## 2023-01-24 MED ORDER — BENZONATATE 100 MG PO CAPS: 100.0000 mg | ORAL_CAPSULE | Freq: Three times a day (TID) | ORAL | 0 refills | Status: DC | PRN

## 2023-01-24 MED ORDER — ALBUTEROL SULFATE HFA 108 (90 BASE) MCG/ACT IN AERS
INHALATION_SPRAY | RESPIRATORY_TRACT | Status: AC
  Filled 2023-01-24: qty 6.7

## 2023-01-24 NOTE — ED Triage Notes (Signed)
Pt states productive cough,and headache for the past 4 days.  States her father has covid and he lives with her.

## 2023-01-24 NOTE — ED Provider Notes (Signed)
[facility]    CSN: [id number] Arrival date & time: 01/24/23  1006     History   Chief Complaint Chief Complaint  Patient presents with   Cough    HPI Meagan [name] is a 62 y.o. female.  Presents with 4 day history of productive cough, headache Chest discomfort with cough. Feels wheezing  Headache today 5/10 No fevers Close exposure to covid - father Has tried cough drops  Hx DM, CKD  She reports history of bronchitis and pneumonia, is worried her current symptoms are pna  Past Medical History:  Diagnosis Date   Arthritis    Chronic kidney disease    Complication of anesthesia    Diabetes mellitus (HCC)    GERD (gastroesophageal reflux disease)    Heart murmur    when 18 per patient   Hyperlipidemia    Hypertension    Neuropathy    Pneumonia    PONV (postoperative nausea and vomiting)    PVD (peripheral vascular disease) ([place])     Patient Active Problem List   Diagnosis Date Noted   Screening for colorectal cancer 12/04/2022   Benign neoplasm of transverse colon 12/04/2022   Non-healing surgical wound 09/17/2022   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   Vaginitis and vulvovaginitis 09/03/2021   Benign hypertension 09/03/2021   Bronchitis 09/03/2021   Gastroesophageal reflux disease 09/03/2021   Genital herpes simplex 09/03/2021   Ischemia of left upper extremity 08/12/2021   Carotid stenosis 07/16/2021   PVD (peripheral vascular disease) ([place]) 07/16/2021   Numbness 04/03/2020   Bilateral carpal tunnel syndrome 04/03/2020   Type 2 diabetes mellitus with stage 2 chronic kidney disease, with long-term current use of insulin ([place]) 09/16/2018   Chronic kidney disease (CKD), stage II (mild) 09/16/2018   Benign hypertensive renal disease 09/16/2018    Past Surgical History:  Procedure Laterality Date   AORTIC ARCH ANGIOGRAPHY N/A 08/01/2021   Procedure: AORTIC ARCH ANGIOGRAPHY;  Surgeon: Marty Heck,  MD;  Location: [place] [facility];  Service: Cardiovascular;  Laterality: N/A;   AORTIC ARCH ANGIOGRAPHY N/A 08/07/2022   Procedure: AORTIC ARCH ANGIOGRAPHY;  Surgeon: Marty Heck, MD;  Location: [place] [facility];  Service: Cardiovascular;  Laterality: N/A;   CAROTID-SUBCLAVIAN BYPASS GRAFT Left 08/12/2021   Procedure: LEFT CAROTID-BRACHIAL ARTERY BYPASS using Left greater saphenous Vein, harvested from left leg.;  Surgeon: Marty Heck, MD;  Location: [place];  Service: Vascular;  Laterality: Left;   CAROTID-SUBCLAVIAN BYPASS GRAFT Left 08/13/2022   Procedure: REDO LEFT COMMON CAROTID-BRACHIAL ARTERY BYPASS USING PROPATEN GRAFT.;  Surgeon: Marty Heck, MD;  Location: [place];  Service: Vascular;  Laterality: Left;   CARPAL TUNNEL RELEASE Left 01/31/2021   CARPAL TUNNEL RELEASE Right 2021   Dr. Mardelle Matte   COLONOSCOPY WITH PROPOFOL N/A 12/04/2022   Procedure: COLONOSCOPY WITH PROPOFOL;  Surgeon: Ladene Artist, MD;  Location: [facility];  Service: Gastroenterology;  Laterality: N/A;   DILATION AND CURETTAGE OF UTERUS  2007   ENDARTERECTOMY Left 08/13/2022   Procedure: ENDARTERECTOMY CAROTID WITH BOVINE PATCH.;  Surgeon: Marty Heck, MD;  Location: [place];  Service: Vascular;  Laterality: Left;   INCISION AND DRAINAGE Left 09/17/2022   Procedure: INCISION AND DRAINAGE LEFT ARM;  Surgeon: Marty Heck, MD;  Location: [place];  Service: Vascular;  Laterality: Left;   POLYPECTOMY  12/04/2022   Procedure: POLYPECTOMY;  Surgeon: Ladene Artist, MD;  Location: [facility];  Service:  Gastroenterology;;   ROTATOR CUFF REPAIR Right 2018   Dr. Mardelle Matte   TUBAL LIGATION  1992   UPPER EXTREMITY ANGIOGRAPHY Left 08/01/2021   Procedure: UPPER EXTREMITY ANGIOGRAPHY;  Surgeon: Marty Heck, MD;  Location: [place] [facility];  Service: Cardiovascular;  Laterality: Left;   UPPER EXTREMITY ANGIOGRAPHY Left 08/07/2022   Procedure: Upper Extremity Angiography;  Surgeon: Marty Heck, MD;  Location: [place] [facility];  Service: Cardiovascular;  Laterality: Left;    OB History   No obstetric history on file.      Home Medications    Prior to Admission medications   Medication Sig Start Date End Date Taking? Authorizing Provider  benzonatate (TESSALON) 100 MG capsule Take 1 capsule (100 mg total) by mouth 3 (three) times daily as needed. 01/24/23  Yes Harlyn Italiano, Wells Guiles, PA-C  amLODipine (NORVASC) 5 MG tablet TAKE 1 TABLET BY MOUTH EVERY DAY Patient taking differently: Take 5 mg by mouth at bedtime. 08/18/22   [place]end[place] Chard, MD  aspirin EC 81 MG tablet Take 1 tablet (81 mg total) by mouth daily. Swallow whole. Patient taking differently: Take 81 mg by mouth at bedtime. Swallow whole. 08/07/22 08/07/23  Marty Heck, MD  atorvastatin (LIPITOR) 80 MG tablet Take 0.5 tablets (40 mg total) by mouth daily. Patient taking differently: Take 80 mg by mouth at bedtime. 04/11/22   [place] Chard, MD  Blood Glucose Monitoring Suppl ([place]) w/Device KIT Use as directed to check blood sugars 2 times per day dx: e11.65 04/09/21   [place]ale Chard, MD  Cholecalciferol (DIALYVITE VITAMIN D 5000 PO) Take 10,000 Units by mouth at bedtime.    [provider]  Continuous Blood Gluc Sensor (DEXCOM G7 SENSOR) MISC USE FOR CONTINUOUS BLOOD GLUCOSE MONITORING,REPLACE EVERY 10 DAYS 11/16/22   [provider]  diclofenac Sodium (VOLTAREN) 1 % GEL Apply 1 Application topically 3 (three) times daily as needed (joint pain). Use as directed, apply to affected joints tid prn 01/15/23   Glendale Chard, MD  empagliflozin (JARDIANCE) 25 MG TABS tablet Take 1 tablet (25 mg total) by mouth daily before breakfast. Patient taking differently: Take 25 mg by mouth at bedtime. 08/26/22   Glendale Chard, MD  ferrous sulfate 325 (65 FE) MG tablet TAKE 1 TABLET BY MOUTH EVERY DAY WITH BREAKFAST Patient taking differently: Take 325 mg by mouth at bedtime. 07/26/21    Ghumman, Ramandeep, NP  gabapentin (NEURONTIN) 300 MG capsule TAKE 1 CAPSULE BY MOUTH 3 (THREE) TIMES DAILY. TAKE 2 CAPSULES TO EQUAL '600MG'$  AT NIGHT 09/16/22   Glendale Chard, MD  glucose blood [facility] VERIO) test strip USE TO CHECK BLOOD SUGAR TWICE A DAY 09/12/21   Minette Brine, FNP  insulin aspart (NOVOLOG FLEXPEN) 100 UNIT/ML FlexPen INJECT 12 UNITS 3 TIMES PER DAY WITH MEALS NOT TO EXCEED 50 UNITS 01/15/23   Glendale Chard, MD  insulin degludec (TRESIBA FLEXTOUCH) 200 UNIT/ML FlexTouch Pen Inject 70 Units into the skin every evening. 09/16/22   Glendale Chard, MD  Insulin Pen Needle (B-D ULTRAFINE III SHORT PEN) 31G X 8 MM MISC Use as directed with insulin pen 08/08/22   Glendale Chard, MD  MAGNESIUM PO Take 1 tablet by mouth daily.    [provider]  Multiple Vitamin (MULTIVITAMIN WITH MINERALS) TABS tablet Take 1 tablet by mouth daily.    [provider]  nystatin-triamcinolone (MYCOLOG II) cream APPLY TO THE AFFECTED AREAS 2 TIMES PER DAY IN THE MORNING AND EVENING FOR 2 WEEKS  AS NEEDED 10/29/22   [provider]  Omega-3 Fatty Acids (FISH OIL PO) Take 1 capsule by mouth in the morning.    [provider]  omeprazole (PRILOSEC OTC) 20 MG tablet Take 20 mg by mouth daily. 08/23/12   [provider]  sertraline (ZOLOFT) 50 MG tablet Take 1 tablet (50 mg total) by mouth at bedtime. 10/20/22   Minette Brine, FNP  tirzepatide [facility]) 5 MG/0.5ML Pen Inject 5 mg into the skin once a week. 01/19/23   Glendale Chard, MD  Turmeric 500 MG CAPS Take 500 mg by mouth at bedtime.    [provider]  valACYclovir (VALTREX) 500 MG tablet TAKE 1 TABLET (500 MG TOTAL) BY MOUTH IN THE MORNING 01/02/23   Glendale Chard, MD    Family History Family History  Problem Relation Age of Onset   Kidney failure Mother    Liver disease Mother    Early death Father    Hypertension Brother    Colon cancer Neg Hx    Stomach cancer Neg Hx    Colon polyps Neg Hx     Esophageal cancer Neg Hx    Ulcerative colitis Neg Hx     Social History Social History   Tobacco Use   Smoking status: Never   Smokeless tobacco: Never  Vaping Use   Vaping Use: Never used  Substance Use Topics   Alcohol use: Yes    Comment: occasional   Drug use: Never     Allergies   Promethazine hcl and Lisinopril   Review of Systems Review of Systems As per HPI  Physical Exam Triage Vital Signs ED Triage Vitals  Enc Vitals Group     BP 01/24/23 1039 132/86     Pulse Rate 01/24/23 1039 85     Resp 01/24/23 1039 16     Temp 01/24/23 1039 98.3 F (36.8 C)     Temp Source 01/24/23 1039 Oral     SpO2 01/24/23 1039 97 %     Weight --      Height --      Head Circumference --      Peak Flow --      Pain Score 01/24/23 1043 5     Pain Loc --      Pain Edu? --      Excl. in [place]? --    No data found.  Updated Vital Signs BP 132/86 (BP Location: Right Leg)   Pulse 85   Temp 98.3 F (36.8 C) (Oral)   Resp 16   SpO2 97%   Physical Exam Vitals and nursing note reviewed.  Constitutional:      General: She is not in acute distress. HENT:     Nose: No congestion or rhinorrhea.     Mouth/Throat:     Mouth: Mucous membranes are moist.     Pharynx: Oropharynx is clear. No posterior oropharyngeal erythema.  Eyes:     Conjunctiva/sclera: Conjunctivae normal.  Cardiovascular:     Rate and Rhythm: Normal rate and regular rhythm.     Pulses: Normal pulses.     Heart sounds: Normal heart sounds.  Pulmonary:     Effort: Pulmonary effort is normal. No respiratory distress.     Breath sounds: Normal breath sounds. No wheezing or rales.  Musculoskeletal:     Cervical back: Normal range of motion.  Lymphadenopathy:     Cervical: No cervical adenopathy.  Skin:    General: Skin is warm and dry.  Neurological:     Mental Status: She is alert and oriented to person, place, and time.     UC Treatments / Results  Labs (all labs ordered are listed, but only  abnormal results are displayed) Labs Reviewed  SARS CORONAVIRUS 2 (TAT 6-24 HRS)    EKG  Radiology DG Chest 2 View  Result Date: 01/24/2023 CLINICAL DATA:  62 year old female presenting for evaluation of productive cough. EXAM: CHEST - 2 VIEW COMPARISON:  November 15, 2020 FINDINGS: The heart size and mediastinal contours are within normal limits. Both lungs are clear. The visualized skeletal structures are unremarkable. IMPRESSION: No active cardiopulmonary disease. Electronically Signed   By: Zetta Bills M.D.   On: 01/24/2023 11:26    Procedures Procedures (including critical care time)  Medications Ordered in UC Medications  albuterol (VENTOLIN HFA) 108 (90 Base) MCG/ACT inhaler 2 puff (2 puffs Inhalation Given 01/24/23 1135)    Initial Impression / Assessment and Plan / UC Course  I have reviewed the triage vital signs and the nursing notes.  Pertinent labs & imaging results that were available during my care of the patient were reviewed by me and considered in my medical decision making (see chart for details).  Afebrile, well appearing  Lungs are clear. Sating well on RA. With patient concerns for pneumonia, offered chest xray Imaging negative.  Covid test pending per patient request. No antivirals if positive  2 puffs albuterol given in clinic Recommend to use inhaler q6 hours for the next several days Tessalon TID Other symptomatic care as needed Return precautions discussed. Patient agrees to plan  Final Clinical Impressions(s) / UC Diagnoses   Final diagnoses:  Viral URI with cough  Close exposure to COVID-19 virus     Discharge Instructions      I recommend to use the inhaler 3x daily for the next 3-4 days  Tessalon (cough pills) can be taken 3x daily  Tylenol 650 mg for headache  Drink lots of fluids!  We will call you if your covid test returns positive.      ED Prescriptions     Medication Sig Dispense Auth. Provider   benzonatate  (TESSALON) 100 MG capsule Take 1 capsule (100 mg total) by mouth 3 (three) times daily as needed. 21 capsule Ariyel Jeangilles, Wells Guiles, PA-C      PDMP not reviewed this encounter.   [place], [place] 01/24/23 1137

## 2023-01-24 NOTE — Discharge Instructions (Addendum)
I recommend to use the inhaler 3x daily for the next 3-4 days  Tessalon (cough pills) can be taken 3x daily  Tylenol 650 mg for headache  Drink lots of fluids!  We will call you if your covid test returns positive.

## 2023-01-25 LAB — SARS CORONAVIRUS 2 (TAT 6-24 HRS): SARS Coronavirus 2: NEGATIVE

## 2023-02-15 ENCOUNTER — Ambulatory Visit (INDEPENDENT_AMBULATORY_CARE_PROVIDER_SITE_OTHER): Payer: BC Managed Care – PPO

## 2023-02-15 ENCOUNTER — Encounter (HOSPITAL_COMMUNITY): Payer: Self-pay

## 2023-02-15 ENCOUNTER — Ambulatory Visit (HOSPITAL_COMMUNITY)
Admission: EM | Admit: 2023-02-15 | Discharge: 2023-02-15 | Disposition: A | Discharge status: Home / Self Care | Payer: BC Managed Care – PPO | Attending: Emergency Medicine | Admitting: Emergency Medicine

## 2023-02-15 DIAGNOSIS — L089 Local infection of the skin and subcutaneous tissue, unspecified: Secondary | ICD-10-CM

## 2023-02-15 DIAGNOSIS — W19XXXA Unspecified fall, initial encounter: Secondary | ICD-10-CM | POA: Diagnosis not present

## 2023-02-15 DIAGNOSIS — M25532 Pain in left wrist: Secondary | ICD-10-CM

## 2023-02-15 MED ORDER — CEPHALEXIN 500 MG PO CAPS: 500.0000 mg | ORAL_CAPSULE | Freq: Four times a day (QID) | ORAL | 0 refills | Status: DC

## 2023-02-15 MED ORDER — MUPIROCIN CALCIUM 2 % EX CREA: 1.0000 | TOPICAL_CREAM | Freq: Two times a day (BID) | CUTANEOUS | 0 refills | Status: DC

## 2023-02-15 NOTE — ED Triage Notes (Signed)
Pt states she has infection in her right middle finger.

## 2023-02-15 NOTE — Discharge Instructions (Signed)
Your xray was negative. Rest,ice,wear ace wrap or velcro wrist splint for left wrist. Take antibiotic as directed. Return as needed.

## 2023-02-15 NOTE — ED Provider Notes (Signed)
[place]    CSN: [id number] Arrival date & time: 02/15/23  1727      History   Chief Complaint Chief Complaint  Patient presents with   Finger Injury    HPI Meagan [name] is a 62 y.o. female.   62 year old female, Meagan [name], present sto urgent for left wrist pain after dog pulled me down and fell on left wrist. Pt also c/o infection to right middle finger. Pt states she has had problems with hangnails in past.   The history is provided by the patient. No language interpreter was used.    Past Medical History:  Diagnosis Date   Arthritis    Chronic kidney disease    Complication of anesthesia    Diabetes mellitus (HCC)    GERD (gastroesophageal reflux disease)    Heart murmur    when 18 per patient   Hyperlipidemia    Hypertension    Neuropathy    Pneumonia    PONV (postoperative nausea and vomiting)    PVD (peripheral vascular disease) ([place])     Patient Active Problem List   Diagnosis Date Noted   Fall 02/15/2023   Acute pain of left wrist 02/15/2023   Finger infection 02/15/2023   Screening for colorectal cancer 12/04/2022   Benign neoplasm of transverse colon 12/04/2022   Non-healing surgical wound 09/17/2022   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   Vaginitis and vulvovaginitis 09/03/2021   Benign hypertension 09/03/2021   Bronchitis 09/03/2021   Gastroesophageal reflux disease 09/03/2021   Genital herpes simplex 09/03/2021   Ischemia of left upper extremity 08/12/2021   Carotid stenosis 07/16/2021   PVD (peripheral vascular disease) ([place]) 07/16/2021   Numbness 04/03/2020   Bilateral carpal tunnel syndrome 04/03/2020   Type 2 diabetes mellitus with stage 2 chronic kidney disease, with long-term current use of insulin ([place]) 09/16/2018   Chronic kidney disease (CKD), stage II (mild) 09/16/2018   Benign hypertensive renal disease 09/16/2018    Past Surgical History:  Procedure Laterality Date    AORTIC ARCH ANGIOGRAPHY N/A 08/01/2021   Procedure: AORTIC ARCH ANGIOGRAPHY;  Surgeon: Marty Heck, MD;  Location: [facility];  Service: Cardiovascular;  Laterality: N/A;   AORTIC ARCH ANGIOGRAPHY N/A 08/07/2022   Procedure: AORTIC ARCH ANGIOGRAPHY;  Surgeon: Marty Heck, MD;  Location: [facility];  Service: Cardiovascular;  Laterality: N/A;   CAROTID-SUBCLAVIAN BYPASS GRAFT Left 08/12/2021   Procedure: LEFT CAROTID-BRACHIAL ARTERY BYPASS using Left greater saphenous Vein, harvested from left leg.;  Surgeon: Marty Heck, MD;  Location: [facility];  Service: Vascular;  Laterality: Left;   CAROTID-SUBCLAVIAN BYPASS GRAFT Left 08/13/2022   Procedure: REDO LEFT COMMON CAROTID-BRACHIAL ARTERY BYPASS USING PROPATEN GRAFT.;  Surgeon: Marty Heck, MD;  Location: [facility];  Service: Vascular;  Laterality: Left;   CARPAL TUNNEL RELEASE Left 01/31/2021   CARPAL TUNNEL RELEASE Right 2021   Dr. Mardelle Matte   COLONOSCOPY WITH PROPOFOL N/A 12/04/2022   Procedure: COLONOSCOPY WITH PROPOFOL;  Surgeon: Ladene Artist, MD;  Location: [facility];  Service: Gastroenterology;  Laterality: N/A;   DILATION AND CURETTAGE OF UTERUS  2007   ENDARTERECTOMY Left 08/13/2022   Procedure: ENDARTERECTOMY CAROTID WITH BOVINE PATCH.;  Surgeon: Marty Heck, MD;  Location: [facility];  Service: Vascular;  Laterality: Left;   INCISION AND DRAINAGE Left 09/17/2022   Procedure: INCISION AND DRAINAGE LEFT ARM;  Surgeon: Marty Heck, MD;  Location: [facility];  Service: Vascular;  Laterality: Left;   POLYPECTOMY  12/04/2022   Procedure: POLYPECTOMY;  Surgeon: Ladene Artist, MD;  Location: [facility];  Service: Gastroenterology;;   ROTATOR CUFF REPAIR Right 2018   Dr. Mardelle Matte   TUBAL LIGATION  1992   UPPER EXTREMITY ANGIOGRAPHY Left 08/01/2021   Procedure: UPPER EXTREMITY ANGIOGRAPHY;  Surgeon: Marty Heck, MD;  Location: [facility];  Service: Cardiovascular;  Laterality:  Left;   UPPER EXTREMITY ANGIOGRAPHY Left 08/07/2022   Procedure: Upper Extremity Angiography;  Surgeon: Marty Heck, MD;  Location: [facility];  Service: Cardiovascular;  Laterality: Left;    OB History   No obstetric history on file.      Home Medications    Prior to Admission medications   Medication Sig Start Date End Date Taking? Authorizing Provider  amLODipine (NORVASC) 5 MG tablet TAKE 1 TABLET BY MOUTH EVERY DAY Patient taking differently: Take 5 mg by mouth at bedtime. 08/18/22  Yes Glendale Chard, MD  aspirin EC 81 MG tablet Take 1 tablet (81 mg total) by mouth daily. Swallow whole. Patient taking differently: Take 81 mg by mouth at bedtime. Swallow whole. 08/07/22 08/07/23 Yes Marty Heck, MD  atorvastatin (LIPITOR) 80 MG tablet Take 0.5 tablets (40 mg total) by mouth daily. Patient taking differently: Take 80 mg by mouth at bedtime. 04/11/22  Yes Glendale Chard, MD  Blood Glucose Monitoring Suppl ([place]) w/Device KIT Use as directed to check blood sugars 2 times per day dx: e11.65 04/09/21  Yes Glendale Chard, MD  cephALEXin (KEFLEX) 500 MG capsule Take 1 capsule (500 mg total) by mouth 4 (four) times daily. 0000000  Yes Suhaila Troiano, Jeanett Schlein, NP  gabapentin (NEURONTIN) 300 MG capsule TAKE 1 CAPSULE BY MOUTH 3 (THREE) TIMES DAILY. TAKE 2 CAPSULES TO EQUAL 600MG  AT NIGHT 09/16/22  Yes Glendale Chard, MD  insulin aspart (NOVOLOG FLEXPEN) 100 UNIT/ML FlexPen INJECT 12 UNITS 3 TIMES PER DAY WITH MEALS NOT TO EXCEED 50 UNITS 01/15/23  Yes Glendale Chard, MD  insulin degludec (TRESIBA FLEXTOUCH) 200 UNIT/ML FlexTouch Pen Inject 70 Units into the skin every evening. 09/16/22  Yes Glendale Chard, MD  Insulin Pen Needle (B-D ULTRAFINE III SHORT PEN) 31G X 8 MM MISC Use as directed with insulin pen 08/08/22  Yes Glendale Chard, MD  MAGNESIUM PO Take 1 tablet by mouth daily.   Yes [provider]  mupirocin cream (BACTROBAN) 2 % Apply 1  Application topically 2 (two) times daily. 0000000  Yes Takeshia Wenk, Jeanett Schlein, NP  Omega-3 Fatty Acids (FISH OIL PO) Take 1 capsule by mouth in the morning.   Yes [provider]  omeprazole (PRILOSEC OTC) 20 MG tablet Take 20 mg by mouth daily. 08/23/12  Yes [provider]  sertraline (ZOLOFT) 50 MG tablet Take 1 tablet (50 mg total) by mouth at bedtime. 10/20/22  Yes Minette Brine, FNP  tirzepatide [facility]) 5 MG/0.5ML Pen Inject 5 mg into the skin once a week. 01/19/23  Yes Glendale Chard, MD  benzonatate (TESSALON) 100 MG capsule Take 1 capsule (100 mg total) by mouth 3 (three) times daily as needed. 01/24/23   Rising, Wells Guiles, PA-C  Cholecalciferol (DIALYVITE VITAMIN D 5000 PO) Take 10,000 Units by mouth at bedtime.    [provider]  Continuous Blood Gluc Sensor (DEXCOM G7 SENSOR) MISC USE FOR CONTINUOUS BLOOD GLUCOSE MONITORING,REPLACE EVERY 10 DAYS 11/16/22   [provider]  diclofenac Sodium (VOLTAREN) 1 % GEL Apply 1 Application topically  3 (three) times daily as needed (joint pain). Use as directed, apply to affected joints tid prn 01/15/23   Glendale Chard, MD  empagliflozin (JARDIANCE) 25 MG TABS tablet Take 1 tablet (25 mg total) by mouth daily before breakfast. Patient taking differently: Take 25 mg by mouth at bedtime. 08/26/22   Glendale Chard, MD  ferrous sulfate 325 (65 FE) MG tablet TAKE 1 TABLET BY MOUTH EVERY DAY WITH BREAKFAST Patient taking differently: Take 325 mg by mouth at bedtime. 07/26/21   [name], NP  glucose blood (ONETOUCH VERIO) test strip USE TO CHECK BLOOD SUGAR TWICE A DAY 09/12/21   Minette Brine, FNP  Multiple Vitamin (MULTIVITAMIN WITH MINERALS) TABS tablet Take 1 tablet by mouth daily.    [provider]  nystatin-triamcinolone (MYCOLOG II) cream APPLY TO THE AFFECTED AREAS 2 TIMES PER DAY IN THE MORNING AND EVENING FOR 2 WEEKS AS NEEDED 10/29/22   [provider]  Turmeric 500 MG CAPS Take 500 mg by  mouth at bedtime.    [provider]  valACYclovir (VALTREX) 500 MG tablet TAKE 1 TABLET (500 MG TOTAL) BY MOUTH IN THE MORNING 01/02/23   Glendale Chard, MD    Family History Family History  Problem Relation Age of Onset   Kidney failure Mother    Liver disease Mother    Early death Father    Hypertension Brother    Colon cancer Neg Hx    Stomach cancer Neg Hx    Colon polyps Neg Hx    Esophageal cancer Neg Hx    Ulcerative colitis Neg Hx     Social History Social History   Tobacco Use   Smoking status: Never   Smokeless tobacco: Never  Vaping Use   Vaping Use: Never used  Substance Use Topics   Alcohol use: Yes    Comment: occasional   Drug use: Never     Allergies   Promethazine hcl and Lisinopril   Review of Systems Review of Systems  Musculoskeletal:  Positive for arthralgias and joint swelling.  Skin:  Positive for color change and wound.  All other systems reviewed and are negative.    Physical Exam Triage Vital Signs ED Triage Vitals  Enc Vitals Group     BP 02/15/23 1742 106/71     Pulse Rate 02/15/23 1742 93     Resp 02/15/23 1742 16     Temp 02/15/23 1742 98.1 F (36.7 C)     Temp Source 02/15/23 1742 Oral     SpO2 02/15/23 1742 98 %     Weight --      Height --      Head Circumference --      Peak Flow --      Pain Score 02/15/23 1845 5     Pain Loc --      Pain Edu? --      Excl. in [place]? --    No data found.  Updated Vital Signs BP 106/71 (BP Location: Left Arm)   Pulse 93   Temp 98.1 F (36.7 C) (Oral)   Resp 16   SpO2 98%   Visual Acuity Right Eye Distance:   Left Eye Distance:   Bilateral Distance:    Right Eye Near:   Left Eye Near:    Bilateral Near:     Physical Exam Vitals and nursing note reviewed.  Constitutional:      General: She is not in acute distress.    Appearance: She is well-developed  and well-groomed.  HENT:     Head: Normocephalic.  Eyes:     Pupils: Pupils are equal, round, and reactive  to light.  Cardiovascular:     Rate and Rhythm: Normal rate and regular rhythm.     Pulses: Normal pulses.     Heart sounds: Normal heart sounds.  Pulmonary:     Effort: Pulmonary effort is normal.     Breath sounds: Normal breath sounds and air entry.  Abdominal:     General: There is no distension.     Tenderness: There is no abdominal tenderness. There is no guarding or rebound.  Musculoskeletal:        General: Normal range of motion.     Cervical back: Normal range of motion.  Skin:    General: Skin is warm and dry.     Findings: Signs of injury present. No rash.     Comments: Right distal 3rd finger, + nail into quick, slight erythema around site, no streaking,no purulent drainage.  Neurological:     General: No focal deficit present.     Mental Status: She is alert and oriented to person, place, and time.     GCS: GCS eye subscore is 4. GCS verbal subscore is 5. GCS motor subscore is 6.  Psychiatric:        Attention and Perception: Attention normal.        Mood and Affect: Mood normal.        Speech: Speech normal.        Behavior: Behavior normal. Behavior is cooperative.      UC Treatments / Results  Labs (all labs ordered are listed, but only abnormal results are displayed) Labs Reviewed - No data to display  EKG   Radiology DG Wrist Complete Left  Result Date: 02/15/2023 CLINICAL DATA:  Fall with wrist pain. EXAM: LEFT WRIST - COMPLETE 3+ VIEW COMPARISON:  Finger radiographs dated 07/12/2022. FINDINGS: There is no evidence of fracture or dislocation. There is no evidence of arthropathy or other focal bone abnormality. There is diffuse osseous demineralization. Soft tissues are unremarkable. IMPRESSION: No acute osseous injury. Electronically Signed   By: Zerita Boers M.D.   On: 02/15/2023 18:35    Procedures Procedures (including critical care time)  Medications Ordered in UC Medications - No data to display  Initial Impression / Assessment and Plan / UC  Course  I have reviewed the triage vital signs and the nursing notes.  Pertinent labs & imaging results that were available during my care of the patient were reviewed by me and considered in my medical decision making (see chart for details).     Ddx: Left wrist sprain, cellulitis Final Clinical Impressions(s) / UC Diagnoses   Final diagnoses:  Acute pain of left wrist  Fall, initial encounter  Finger infection     Discharge Instructions      Your xray was negative. Rest,ice,wear ace wrap or velcro wrist splint for left wrist. Take antibiotic as directed. Return as needed.    ED Prescriptions     Medication Sig Dispense Auth. Provider   cephALEXin (KEFLEX) 500 MG capsule Take 1 capsule (500 mg total) by mouth 4 (four) times daily. 20 capsule Hope Brandenburger, NP   mupirocin cream (BACTROBAN) 2 % Apply 1 Application topically 2 (two) times daily. 15 g Lynia Landry, Jeanett Schlein, NP      PDMP not reviewed this encounter.   Tori Milks, NP 0000000 [phone]

## 2023-02-21 ENCOUNTER — Other Ambulatory Visit: Payer: Self-pay | Admitting: Internal Medicine

## 2023-02-25 ENCOUNTER — Other Ambulatory Visit: Payer: Self-pay | Admitting: Nurse Practitioner

## 2023-02-25 DIAGNOSIS — F419 Anxiety disorder, unspecified: Secondary | ICD-10-CM

## 2023-03-02 ENCOUNTER — Other Ambulatory Visit: Payer: Self-pay | Admitting: Internal Medicine

## 2023-03-03 NOTE — Progress Notes (Unsigned)
Name: Meagan [name]  MRN/ DOB: [id number], [birth year]   Age/ Sex: 62 y.o., female    PCP: Glendale Chard, MD   Reason for Endocrinology Evaluation: Type {NUMBERS 1 OR 2:522190} Diabetes Mellitus     Date of Initial Endocrinology Visit: 03/03/2023     PATIENT IDENTIFIER: Meagan [name] is a 63 y.o. female with a past medical history of ***. The patient presented for initial endocrinology clinic visit on 03/03/2023 for consultative assistance with her diabetes management.    HPI: Meagan [name] was    Diagnosed with DM *** Prior Medications tried/Intolerance: *** Currently checking blood sugars *** x / day,  before breakfast and ***.  Hypoglycemia episodes : ***               Symptoms: ***                 Frequency: ***/  Hemoglobin A1c has ranged from *** in ***, peaking at *** in ***. Patient required assistance for hypoglycemia:  Patient has required hospitalization within the last 1 year from hyper or hypoglycemia:   In terms of diet, the patient ***   HOME DIABETES REGIMEN: Basal: ***  Bolus: ***   Statin: {Yes/No:11203} ACE-I/ARB: {YES/NO:17245} Prior Diabetic Education: {Yes/No:11203}   METER DOWNLOAD SUMMARY: Date range evaluated: *** Fingerstick Blood Glucose Tests = *** Average Number Tests/Day = *** Overall Mean FS Glucose = *** Standard Deviation = ***  BG Ranges: Low = *** High = ***   Hypoglycemic Events/30 Days: BG < 50 = *** Episodes of symptomatic severe hypoglycemia = ***   DIABETIC COMPLICATIONS: Microvascular complications:  *** Denies: *** Last eye exam: Completed   Macrovascular complications:  *** Denies: CAD, PVD, CVA   PAST HISTORY: Past Medical History:  Past Medical History:  Diagnosis Date   Arthritis    Chronic kidney disease    Complication of anesthesia    Diabetes mellitus (HCC)    GERD (gastroesophageal reflux disease)    Heart murmur    when 18 per patient   Hyperlipidemia    Hypertension    Neuropathy     Pneumonia    PONV (postoperative nausea and vomiting)    PVD (peripheral vascular disease) ([place])    Past Surgical History:  Past Surgical History:  Procedure Laterality Date   AORTIC ARCH ANGIOGRAPHY N/A 08/01/2021   Procedure: AORTIC ARCH ANGIOGRAPHY;  Surgeon: Marty Heck, MD;  Location: [facility];  Service: Cardiovascular;  Laterality: N/A;   AORTIC ARCH ANGIOGRAPHY N/A 08/07/2022   Procedure: AORTIC ARCH ANGIOGRAPHY;  Surgeon: Marty Heck, MD;  Location: [facility];  Service: Cardiovascular;  Laterality: N/A;   CAROTID-SUBCLAVIAN BYPASS GRAFT Left 08/12/2021   Procedure: LEFT CAROTID-BRACHIAL ARTERY BYPASS using Left greater saphenous Vein, harvested from left leg.;  Surgeon: Marty Heck, MD;  Location: [place];  Service: Vascular;  Laterality: Left;   CAROTID-SUBCLAVIAN BYPASS GRAFT Left 08/13/2022   Procedure: REDO LEFT COMMON CAROTID-BRACHIAL ARTERY BYPASS USING PROPATEN GRAFT.;  Surgeon: Marty Heck, MD;  Location: [place];  Service: Vascular;  Laterality: Left;   CARPAL TUNNEL RELEASE Left 01/31/2021   CARPAL TUNNEL RELEASE Right 2021   Dr. Mardelle Matte   COLONOSCOPY WITH PROPOFOL N/A 12/04/2022   Procedure: COLONOSCOPY WITH PROPOFOL;  Surgeon: Ladene Artist, MD;  Location: [facility];  Service: Gastroenterology;  Laterality: N/A;   DILATION AND CURETTAGE OF UTERUS  2007   ENDARTERECTOMY Left 08/13/2022   Procedure: ENDARTERECTOMY CAROTID WITH BOVINE PATCH.;  Surgeon: Marty Heck, MD;  Location: [facility];  Service: Vascular;  Laterality: Left;   INCISION AND DRAINAGE Left 09/17/2022   Procedure: INCISION AND DRAINAGE LEFT ARM;  Surgeon: Marty Heck, MD;  Location: [facility];  Service: Vascular;  Laterality: Left;   POLYPECTOMY  12/04/2022   Procedure: POLYPECTOMY;  Surgeon: Ladene Artist, MD;  Location: [facility];  Service: Gastroenterology;;   ROTATOR CUFF REPAIR Right 2018   Dr. Mardelle Matte   TUBAL LIGATION  1992   UPPER  EXTREMITY ANGIOGRAPHY Left 08/01/2021   Procedure: UPPER EXTREMITY ANGIOGRAPHY;  Surgeon: Marty Heck, MD;  Location: [facility];  Service: Cardiovascular;  Laterality: Left;   UPPER EXTREMITY ANGIOGRAPHY Left 08/07/2022   Procedure: Upper Extremity Angiography;  Surgeon: Marty Heck, MD;  Location: [facility];  Service: Cardiovascular;  Laterality: Left;    Social History:  reports that she has never smoked. She has never used smokeless tobacco. She reports current alcohol use. She reports that she does not use drugs. Family History:  Family History  Problem Relation Age of Onset   Kidney failure Mother    Liver disease Mother    Early death Father    Hypertension Brother    Colon cancer Neg Hx    Stomach cancer Neg Hx    Colon polyps Neg Hx    Esophageal cancer Neg Hx    Ulcerative colitis Neg Hx      HOME MEDICATIONS: Allergies as of 03/04/2023       Reactions   Promethazine Hcl Shortness Of Breath, Other (See Comments)   Bells palsy   Lisinopril Itching, Swelling        Medication List        Accurate as of March 03, 2023  5:00 PM. If you have any questions, ask your nurse or doctor.          amLODipine 5 MG tablet Commonly known as: NORVASC Take 1 tablet (5 mg total) by mouth at bedtime.   aspirin EC 81 MG tablet Take 1 tablet (81 mg total) by mouth daily. Swallow whole. What changed: when to take this   atorvastatin 80 MG tablet Commonly known as: LIPITOR Take 0.5 tablets (40 mg total) by mouth daily. What changed:  how much to take when to take this   B-D ULTRAFINE III SHORT PEN 31G X 8 MM Misc Generic drug: Insulin Pen Needle Use as directed with insulin pen   benzonatate 100 MG capsule Commonly known as: TESSALON Take 1 capsule (100 mg total) by mouth 3 (three) times daily as needed.   cephALEXin 500 MG capsule Commonly known as: KEFLEX Take 1 capsule (500 mg total) by mouth 4 (four) times daily.   Dexcom G7  Sensor Misc USE FOR CONTINUOUS BLOOD GLUCOSE MONITORING,REPLACE EVERY 10 DAYS   DIALYVITE VITAMIN D 5000 PO Take 10,000 Units by mouth at bedtime.   diclofenac Sodium 1 % Gel Commonly known as: VOLTAREN Apply 1 Application topically 3 (three) times daily as needed (joint pain). Use as directed, apply to affected joints tid prn   empagliflozin 25 MG Tabs tablet Commonly known as: Jardiance Take 1 tablet (25 mg total) by mouth daily before breakfast. What changed: when to take this   ferrous sulfate 325 (65 FE) MG tablet TAKE 1 TABLET BY MOUTH EVERY DAY WITH BREAKFAST What changed: See the new instructions.   FISH OIL PO Take 1 capsule by mouth in the morning.   gabapentin 300  MG capsule Commonly known as: NEURONTIN TAKE 1 CAPSULE BY MOUTH 3 (THREE) TIMES DAILY. TAKE 2 CAPSULES TO EQUAL 600MG  AT NIGHT   MAGNESIUM PO Take 1 tablet by mouth daily.   Mounjaro 5 MG/0.5ML Pen Generic drug: tirzepatide INJECT 5 MG SUBCUTANEOUSLY WEEKLY   multivitamin with minerals Tabs tablet Take 1 tablet by mouth daily.   mupirocin cream 2 % Commonly known as: BACTROBAN Apply 1 Application topically 2 (two) times daily.   NovoLOG FlexPen 100 UNIT/ML FlexPen Generic drug: insulin aspart INJECT 12 UNITS 3 TIMES PER DAY WITH MEALS NOT TO EXCEED 50 UNITS   nystatin-triamcinolone cream Commonly known as: MYCOLOG II APPLY TO THE AFFECTED AREAS 2 TIMES PER DAY IN THE MORNING AND EVENING FOR 2 WEEKS AS NEEDED   omeprazole 20 MG tablet Commonly known as: PRILOSEC OTC Take 20 mg by mouth daily.   OneTouch Verio Flex System w/Device Kit Use as directed to check blood sugars 2 times per day dx: e11.65   OneTouch Verio test strip Generic drug: glucose blood USE TO CHECK BLOOD SUGAR TWICE A DAY   sertraline 50 MG tablet Commonly known as: ZOLOFT TAKE 1 TABLET BY MOUTH EVERYDAY AT BEDTIME   Tresiba FlexTouch 200 UNIT/ML FlexTouch Pen Generic drug: insulin degludec Inject 70 Units into the  skin every evening.   Turmeric 500 MG Caps Take 500 mg by mouth at bedtime.   valACYclovir 500 MG tablet Commonly known as: VALTREX TAKE 1 TABLET (500 MG TOTAL) BY MOUTH IN THE MORNING         ALLERGIES: Allergies  Allergen Reactions   Promethazine Hcl Shortness Of Breath and Other (See Comments)    Bells palsy   Lisinopril Itching and Swelling     REVIEW OF SYSTEMS: A comprehensive ROS was conducted with the patient and is negative except as per HPI and below:  ROS    OBJECTIVE:   VITAL SIGNS: There were no vitals taken for this visit.   PHYSICAL EXAM:  General: Pt appears well and is in NAD  Neck: General: Supple without adenopathy or carotid bruits. Thyroid: Thyroid size normal.  No goiter or nodules appreciated.   Lungs: Clear with good BS bilat with no rales, rhonchi, or wheezes  Heart: RRR   Abdomen:  soft, nontender  Extremities:  Lower extremities - No pretibial edema. No lesions.  Skin: Normal texture and temperature to palpation. No rash noted.  Neuro: MS is good with appropriate affect, pt is alert and Ox3    DM foot exam:    DATA REVIEWED:  Lab Results  Component Value Date   HGBA1C 10.0 (H) 11/03/2022   HGBA1C 11.2 (H) 07/30/2022   HGBA1C 11.0 (H) 03/17/2022   Lab Results  Component Value Date   MICROALBUR 150 06/18/2021   LDLCALC 43 08/14/2022   CREATININE 1.11 (H) 11/03/2022   Lab Results  Component Value Date   MICRALBCREAT 217 (H) 03/17/2022    Lab Results  Component Value Date   CHOL 86 08/14/2022   HDL 28 (L) 08/14/2022   LDLCALC 43 08/14/2022   TRIG 76 08/14/2022   CHOLHDL 3.1 08/14/2022        ASSESSMENT / PLAN / RECOMMENDATIONS:   1) Type *** Diabetes Mellitus, ***controlled, With*** complications - Most recent A1c of *** %. Goal A1c < *** %.  ***  Plan: GENERAL: ***  MEDICATIONS: ***  EDUCATION / INSTRUCTIONS: BG monitoring instructions: Patient is instructed to check her blood sugars *** times a day,  ***.  Call [facility] if: BG persistently < 70  I reviewed the Rule of 15 for the treatment of hypoglycemia in detail with the patient. Literature supplied.   2) Diabetic complications:  Eye: Does *** have known diabetic retinopathy.  Neuro/ Feet: Does *** have known diabetic peripheral neuropathy. Renal: Patient does *** have known baseline CKD. She is *** on an ACEI/ARB at present.  3) Lipids: Patient is *** on a statin.    4) Hypertension: ***  at goal of < 140/90 mmHg.       Signed electronically by: Mack Guise, MD  [facility]  [facility] [address], [place], [place] [address] Phone: [phone] FAX: [phone]   CC: Glendale Chard, [address] [place] [address] Phone: [phone]  Fax: [phone]    Return to Endocrinology clinic as below: Future Appointments  Date Time Provider [place]  03/04/2023  1:20 PM King Pinzon, Melanie Crazier, MD [facility] None  05/06/2023 11:00 AM Glendale Chard, MD [facility] None

## 2023-03-04 ENCOUNTER — Ambulatory Visit (INDEPENDENT_AMBULATORY_CARE_PROVIDER_SITE_OTHER): Payer: BC Managed Care – PPO | Admitting: Internal Medicine

## 2023-03-04 ENCOUNTER — Other Ambulatory Visit: Payer: Self-pay

## 2023-03-04 ENCOUNTER — Encounter: Payer: Self-pay | Admitting: Internal Medicine

## 2023-03-04 VITALS — HR 82 | Ht 63.0 in | Wt 204.0 lb

## 2023-03-04 DIAGNOSIS — E1122 Type 2 diabetes mellitus with diabetic chronic kidney disease: Secondary | ICD-10-CM | POA: Diagnosis not present

## 2023-03-04 DIAGNOSIS — E1151 Type 2 diabetes mellitus with diabetic peripheral angiopathy without gangrene: Secondary | ICD-10-CM | POA: Diagnosis not present

## 2023-03-04 DIAGNOSIS — E1165 Type 2 diabetes mellitus with hyperglycemia: Secondary | ICD-10-CM

## 2023-03-04 DIAGNOSIS — Z794 Long term (current) use of insulin: Secondary | ICD-10-CM | POA: Diagnosis not present

## 2023-03-04 DIAGNOSIS — N1832 Chronic kidney disease, stage 3b: Secondary | ICD-10-CM

## 2023-03-04 LAB — GLUCOSE, POCT (MANUAL RESULT ENTRY): POC Glucose: 185 mg/dl — AB (ref 70–99)

## 2023-03-04 LAB — POCT GLYCOSYLATED HEMOGLOBIN (HGB A1C): Hemoglobin A1C: 8.2 % — AB (ref 4.0–5.6)

## 2023-03-04 MED ORDER — LOSARTAN POTASSIUM 25 MG PO TABS: 25.0000 mg | ORAL_TABLET | Freq: Every day | ORAL | 3 refills | Status: DC

## 2023-03-04 MED ORDER — TIRZEPATIDE 7.5 MG/0.5ML ~~LOC~~ SOAJ
7.5000 mg | SUBCUTANEOUS | 3 refills | Status: DC
  Filled 2023-03-04: qty 2, 28d supply, fill #0
  Filled 2023-04-06 (×2): qty 2, 28d supply, fill #1
  Filled 2023-04-27 – 2023-05-06 (×2): qty 2, 28d supply, fill #2
  Filled 2023-06-05 – 2023-06-12 (×2): qty 2, 28d supply, fill #3
  Filled 2023-07-05: qty 2, 28d supply, fill #4
  Filled 2023-08-06: qty 2, 28d supply, fill #5

## 2023-03-04 MED ORDER — INSULIN PEN NEEDLE 32G X 4 MM MISC
1.0000 | Freq: Four times a day (QID) | 3 refills | Status: DC
Start: 2023-03-04 — End: 2024-03-04

## 2023-03-04 MED ORDER — EMPAGLIFLOZIN 25 MG PO TABS: 25.0000 mg | ORAL_TABLET | Freq: Every day | ORAL | 3 refills | Status: DC

## 2023-03-04 MED ORDER — TRESIBA FLEXTOUCH 200 UNIT/ML ~~LOC~~ SOPN: 70.0000 [IU] | PEN_INJECTOR | Freq: Every evening | SUBCUTANEOUS | 3 refills | Status: DC

## 2023-03-04 MED ORDER — NOVOLOG FLEXPEN 100 UNIT/ML ~~LOC~~ SOPN: 12.0000 [IU] | PEN_INJECTOR | Freq: Three times a day (TID) | SUBCUTANEOUS | 6 refills | Status: DC

## 2023-03-04 NOTE — Patient Instructions (Addendum)
Take Jardiance 25 mg, 1 tablet every morning  Increase Mounjaro 7.5 mg once weekly  Continue Tresiba 70 units daily  Continue NovoLog 12 units with each meal    Start Losartan 25 mg daily ( to protect kidneys )   HOW TO TREAT LOW BLOOD SUGARS (Blood sugar LESS THAN 70 MG/DL) Please follow the RULE OF 15 for the treatment of hypoglycemia treatment (when your (blood sugars are less than 70 mg/dL)   STEP 1: Take 15 grams of carbohydrates when your blood sugar is low, which includes:  3-4 GLUCOSE TABS  OR 3-4 OZ OF JUICE OR REGULAR SODA OR ONE TUBE OF GLUCOSE GEL    STEP 2: RECHECK blood sugar in 15 MINUTES STEP 3: If your blood sugar is still low at the 15 minute recheck --> then, go back to STEP 1 and treat AGAIN with another 15 grams of carbohydrates.

## 2023-03-06 ENCOUNTER — Other Ambulatory Visit: Payer: Self-pay

## 2023-03-11 ENCOUNTER — Other Ambulatory Visit: Payer: Self-pay

## 2023-04-06 ENCOUNTER — Encounter: Payer: Self-pay | Admitting: Obstetrics and Gynecology

## 2023-04-06 ENCOUNTER — Other Ambulatory Visit: Payer: Self-pay | Admitting: Obstetrics and Gynecology

## 2023-04-06 ENCOUNTER — Other Ambulatory Visit: Payer: Self-pay

## 2023-04-06 DIAGNOSIS — N6320 Unspecified lump in the left breast, unspecified quadrant: Secondary | ICD-10-CM

## 2023-04-13 ENCOUNTER — Other Ambulatory Visit: Payer: Self-pay

## 2023-04-20 ENCOUNTER — Other Ambulatory Visit: Payer: Self-pay | Admitting: Internal Medicine

## 2023-04-20 MED ORDER — OMEPRAZOLE 40 MG PO CPDR: 40.0000 mg | DELAYED_RELEASE_CAPSULE | Freq: Every day | ORAL | 2 refills | Status: DC

## 2023-04-22 ENCOUNTER — Other Ambulatory Visit: Payer: BC Managed Care – PPO

## 2023-04-22 DIAGNOSIS — E1122 Type 2 diabetes mellitus with diabetic chronic kidney disease: Secondary | ICD-10-CM

## 2023-04-23 LAB — LIPID PANEL
Chol/HDL Ratio: 4.7 ratio — ABNORMAL HIGH (ref 0.0–4.4)
Cholesterol, Total: 189 mg/dL (ref 100–199)
HDL: 40 mg/dL (ref >39)
LDL Chol Calc (NIH): 130 mg/dL — ABNORMAL HIGH (ref 0–99)
Triglycerides: 102 mg/dL (ref 0–149)
VLDL Cholesterol Cal: 19 mg/dL (ref 5–40)

## 2023-05-01 ENCOUNTER — Other Ambulatory Visit: Payer: Self-pay | Admitting: Internal Medicine

## 2023-05-04 ENCOUNTER — Other Ambulatory Visit: Payer: Self-pay

## 2023-05-06 ENCOUNTER — Encounter: Payer: Self-pay | Admitting: Internal Medicine

## 2023-05-06 ENCOUNTER — Ambulatory Visit (INDEPENDENT_AMBULATORY_CARE_PROVIDER_SITE_OTHER): Payer: BC Managed Care – PPO | Admitting: Internal Medicine

## 2023-05-06 VITALS — BP 132/86 | HR 82 | Temp 98.5°F | Wt 194.2 lb

## 2023-05-06 DIAGNOSIS — I739 Peripheral vascular disease, unspecified: Secondary | ICD-10-CM | POA: Diagnosis not present

## 2023-05-06 DIAGNOSIS — Z794 Long term (current) use of insulin: Secondary | ICD-10-CM | POA: Diagnosis not present

## 2023-05-06 DIAGNOSIS — Z0000 Encounter for general adult medical examination without abnormal findings: Secondary | ICD-10-CM

## 2023-05-06 DIAGNOSIS — N182 Chronic kidney disease, stage 2 (mild): Secondary | ICD-10-CM | POA: Diagnosis not present

## 2023-05-06 DIAGNOSIS — E7800 Pure hypercholesterolemia, unspecified: Secondary | ICD-10-CM

## 2023-05-06 DIAGNOSIS — I129 Hypertensive chronic kidney disease with stage 1 through stage 4 chronic kidney disease, or unspecified chronic kidney disease: Secondary | ICD-10-CM

## 2023-05-06 DIAGNOSIS — Z6834 Body mass index (BMI) 34.0-34.9, adult: Secondary | ICD-10-CM

## 2023-05-06 DIAGNOSIS — E6609 Other obesity due to excess calories: Secondary | ICD-10-CM

## 2023-05-06 DIAGNOSIS — I1310 Hypertensive heart and chronic kidney disease without heart failure, with stage 1 through stage 4 chronic kidney disease, or unspecified chronic kidney disease: Secondary | ICD-10-CM

## 2023-05-06 DIAGNOSIS — E1122 Type 2 diabetes mellitus with diabetic chronic kidney disease: Secondary | ICD-10-CM

## 2023-05-06 LAB — POCT URINALYSIS DIPSTICK
Bilirubin, UA: NEGATIVE
Glucose, UA: POSITIVE — AB
Ketones, UA: NEGATIVE
Leukocytes, UA: NEGATIVE
Nitrite, UA: NEGATIVE
Protein, UA: POSITIVE — AB
Spec Grav, UA: >=1.03 — AB (ref 1.010–1.025)
Urobilinogen, UA: 0.2 E.U./dL
pH, UA: 5.5 (ref 5.0–8.0)

## 2023-05-06 NOTE — Progress Notes (Signed)
I,[name],acting as a scribe for Gwynneth Aliment, MD.,have documented all relevant documentation on the behalf of Gwynneth Aliment, MD,as directed by  Gwynneth Aliment, MD while in the presence of Gwynneth Aliment, MD.   Subjective:     Patient ID: Meagan [name] , female    DOB: 1961/08/06 , 62 y.o.   MRN: [id number]   Chief Complaint  Patient presents with   Annual Exam   Hypertension   Diabetes    HPI  The patient is here today for a physical examination. The patient is followed by Dr. Osborn Coho for pelvic exams. She was last seen in December 2023. She has her mammograms performed at her office as well. She reports compliance with meds, but admits that she is not exercise as much as she should. Denies headache, chest pain, and sob.   Diabetes She presents for her follow-up diabetic visit. She has type 2 diabetes mellitus. Her disease course has been stable. There are no hypoglycemic associated symptoms. There are no diabetic associated symptoms. Pertinent negatives for diabetes include no blurred vision, no chest pain, no fatigue and no visual change. There are no hypoglycemic complications. Symptoms are stable. There are no diabetic complications. Risk factors for coronary artery disease include hypertension, obesity and diabetes mellitus. Current diabetic treatment includes oral agent (monotherapy). She is compliant with treatment all of the time. She has not had a previous visit with a dietitian. She participates in exercise intermittently. Her breakfast blood glucose is taken between 9-10 am. Her breakfast blood glucose range is generally 110-130 mg/dl. (Blood sugar averaging 130-140 was running in 90's) She does not see a podiatrist.Eye exam is current.  Hypertension This is a chronic problem. The current episode started more than 1 year ago. Pertinent negatives include no blurred vision or chest pain. Risk factors for coronary artery disease include diabetes mellitus,  dyslipidemia, obesity, post-menopausal state and sedentary lifestyle. Past treatments include ACE inhibitors. Compliance problems include exercise.      Past Medical History:  Diagnosis Date   Arthritis    Chronic kidney disease    Complication of anesthesia    Diabetes mellitus (HCC)    GERD (gastroesophageal reflux disease)    Heart murmur    when 18 per patient   Hyperlipidemia    Hypertension    Neuropathy    Pneumonia    PONV (postoperative nausea and vomiting)    PVD (peripheral vascular disease) (HCC)      Family History  Problem Relation Age of Onset   Kidney failure Mother    Liver disease Mother    Early death Father    Hypertension Brother    Colon cancer Neg Hx    Stomach cancer Neg Hx    Colon polyps Neg Hx    Esophageal cancer Neg Hx    Ulcerative colitis Neg Hx      Current Outpatient Medications:    aspirin EC 81 MG tablet, Take 1 tablet (81 mg total) by mouth daily. Swallow whole. (Patient taking differently: Take 81 mg by mouth at bedtime. Swallow whole.), Disp: 150 tablet, Rfl: 2   benzonatate (TESSALON) 100 MG capsule, Take 1 capsule (100 mg total) by mouth 3 (three) times daily as needed., Disp: 21 capsule, Rfl: 0   Blood Glucose Monitoring Suppl (ONETOUCH VERIO FLEX SYSTEM) w/Device KIT, Use as directed to check blood sugars 2 times per day dx: e11.65, Disp: 1 kit, Rfl: 1   Cholecalciferol (DIALYVITE VITAMIN D 5000 PO),  Take 10,000 Units by mouth at bedtime., Disp: , Rfl:    Continuous Blood Gluc Sensor (DEXCOM G7 SENSOR) MISC, USE FOR CONTINUOUS BLOOD GLUCOSE MONITORING,REPLACE EVERY 10 DAYS, Disp: , Rfl:    diclofenac Sodium (VOLTAREN) 1 % GEL, Apply 1 Application topically 3 (three) times daily as needed (joint pain). Use as directed, apply to affected joints tid prn, Disp: 100 g, Rfl: 1   empagliflozin (JARDIANCE) 25 MG TABS tablet, Take 1 tablet (25 mg total) by mouth daily before breakfast., Disp: 90 tablet, Rfl: 3   ferrous sulfate 325 (65 FE) MG  tablet, TAKE 1 TABLET BY MOUTH EVERY DAY WITH BREAKFAST (Patient taking differently: Take 325 mg by mouth at bedtime.), Disp: 90 tablet, Rfl: 1   gabapentin (NEURONTIN) 300 MG capsule, TAKE 1 CAPSULE BY MOUTH 3 (THREE) TIMES DAILY. TAKE 2 CAPSULES TO EQUAL 600MG  AT NIGHT, Disp: 180 capsule, Rfl: 2   glucose blood (ONETOUCH VERIO) test strip, USE TO CHECK BLOOD SUGAR TWICE A DAY, Disp: 100 strip, Rfl: 12   insulin aspart (NOVOLOG FLEXPEN) 100 UNIT/ML FlexPen, Inject 12 Units into the skin 3 (three) times daily with meals., Disp: 30 mL, Rfl: 6   insulin degludec (TRESIBA FLEXTOUCH) 200 UNIT/ML FlexTouch Pen, Inject 70 Units into the skin every evening., Disp: 30 mL, Rfl: 3   Insulin Pen Needle 32G X 4 MM MISC, 1 Device by Does not apply route in the morning, at noon, in the evening, and at bedtime., Disp: 400 each, Rfl: 3   losartan (COZAAR) 25 MG tablet, Take 1 tablet (25 mg total) by mouth daily., Disp: 90 tablet, Rfl: 3   MAGNESIUM PO, Take 1 tablet by mouth daily., Disp: , Rfl:    Multiple Vitamin (MULTIVITAMIN WITH MINERALS) TABS tablet, Take 1 tablet by mouth daily., Disp: , Rfl:    mupirocin cream (BACTROBAN) 2 %, Apply 1 Application topically 2 (two) times daily., Disp: 15 g, Rfl: 0   nystatin-triamcinolone (MYCOLOG II) cream, APPLY TO THE AFFECTED AREAS 2 TIMES PER DAY IN THE MORNING AND EVENING FOR 2 WEEKS AS NEEDED, Disp: , Rfl:    Omega-3 Fatty Acids (FISH OIL PO), Take 1 capsule by mouth in the morning., Disp: , Rfl:    omeprazole (PRILOSEC) 40 MG capsule, Take 1 capsule (40 mg total) by mouth daily., Disp: 90 capsule, Rfl: 2   sertraline (ZOLOFT) 50 MG tablet, TAKE 1 TABLET BY MOUTH EVERYDAY AT BEDTIME, Disp: 90 tablet, Rfl: 1   tirzepatide (MOUNJARO) 7.5 MG/0.5ML Pen, Inject 7.5 mg into the skin once a week., Disp: 6 mL, Rfl: 3   Turmeric 500 MG CAPS, Take 500 mg by mouth at bedtime., Disp: , Rfl:    valACYclovir (VALTREX) 500 MG tablet, TAKE 1 TABLET (500 MG TOTAL) BY MOUTH IN THE  MORNING, Disp: 90 tablet, Rfl: 1   atorvastatin (LIPITOR) 80 MG tablet, Take 1 tablet (80 mg total) by mouth daily., Disp: 90 tablet, Rfl: 2   cephALEXin (KEFLEX) 500 MG capsule, Take 1 capsule (500 mg total) by mouth 4 (four) times daily. (Patient not taking: Reported on 03/04/2023), Disp: 20 capsule, Rfl: 0   Allergies  Allergen Reactions   Promethazine Hcl Shortness Of Breath and Other (See Comments)    Bells palsy   Lisinopril Itching and Swelling      The patient states she uses post menopausal status and tubal ligation for birth control. Last LMP was No LMP recorded. Patient is postmenopausal.. Negative for Dysmenorrhea. Negative for: breast discharge, breast lump(s),  breast pain and breast self exam. Associated symptoms include abnormal vaginal bleeding. Pertinent negatives include abnormal bleeding (hematology), anxiety, decreased libido, depression, difficulty falling sleep, dyspareunia, history of infertility, nocturia, sexual dysfunction, sleep disturbances, urinary incontinence, urinary urgency, vaginal discharge and vaginal itching. Diet regular.The patient states her exercise level is  intermittent.   . The patient's tobacco use is:  Social History   Tobacco Use  Smoking Status Never  Smokeless Tobacco Never  . She has been exposed to passive smoke. The patient's alcohol use is:  Social History   Substance and Sexual Activity  Alcohol Use Yes   Comment: occasional    Review of Systems  Constitutional: Negative.  Negative for fatigue.  HENT: Negative.    Eyes: Negative.  Negative for blurred vision.  Respiratory: Negative.    Cardiovascular: Negative.  Negative for chest pain.  Gastrointestinal: Negative.   Endocrine: Negative.   Genitourinary: Negative.   Musculoskeletal: Negative.   Skin: Negative.   Allergic/Immunologic: Negative.   Neurological: Negative.   Hematological: Negative.   Psychiatric/Behavioral: Negative.       Today's Vitals   05/06/23 1113   BP: 132/86  Pulse: 82  Temp: 98.5 F (36.9 C)  SpO2: 98%  Weight: 194 lb 3.2 oz (88.1 kg)   Body mass index is 34.4 kg/m.  Wt Readings from Last 3 Encounters:  05/06/23 194 lb 3.2 oz (88.1 kg)  03/04/23 204 lb (92.5 kg)  12/15/22 202 lb (91.6 kg)    BP Readings from Last 3 Encounters:  05/06/23 132/86  02/15/23 106/71  01/24/23 132/86     The 10-year ASCVD risk score (Arnett DK, et al., 2019) is: 18.5%   Values used to calculate the score:     Age: 73 years     Sex: Female     Is Non-Hispanic African American: Yes     Diabetic: Yes     Tobacco smoker: No     Systolic Blood Pressure: 132 mmHg     Is BP treated: Yes     HDL Cholesterol: 38 mg/dL     Total Cholesterol: 170 mg/dL  Objective:  Physical Exam Vitals and nursing note reviewed.  Constitutional:      Appearance: Normal appearance.  HENT:     Head: Normocephalic and atraumatic.     Right Ear: Tympanic membrane, ear canal and external ear normal.     Left Ear: Tympanic membrane, ear canal and external ear normal.     Nose: Nose normal.     Mouth/Throat:     Mouth: Mucous membranes are moist.     Pharynx: Oropharynx is clear.  Eyes:     Extraocular Movements: Extraocular movements intact.     Conjunctiva/sclera: Conjunctivae normal.     Pupils: Pupils are equal, round, and reactive to light.  Cardiovascular:     Rate and Rhythm: Normal rate and regular rhythm.     Pulses:          Dorsalis pedis pulses are 1+ on the right side and 1+ on the left side.     Heart sounds: Normal heart sounds.  Pulmonary:     Effort: Pulmonary effort is normal.     Breath sounds: Normal breath sounds.  Abdominal:     General: Abdomen is flat. Bowel sounds are normal.     Palpations: Abdomen is soft.  Genitourinary:    Comments: deferred Musculoskeletal:        General: Normal range of motion.     Cervical back:  Normal range of motion and neck supple.  Feet:     Right foot:     Protective Sensation: 5 sites tested.   5 sites sensed.     Toenail Condition: Right toenails are long.     Left foot:     Protective Sensation: 5 sites tested.  5 sites sensed.     Toenail Condition: Left toenails are long.  Skin:    General: Skin is warm and dry.  Neurological:     General: No focal deficit present.     Mental Status: She is alert and oriented to person, place, and time.  Psychiatric:        Mood and Affect: Mood normal.        Behavior: Behavior normal.         Assessment And Plan:     1. Encounter for general adult medical examination w/o abnormal findings Comments: A full exam was performed. Importance of monthly self breast examinations was discussed with the patient.  PATIENT IS ADVISED TO GET 30-45 MINUTES REGULAR EXERCISE NO LESS THAN FOUR TO FIVE DAYS PER WEEK - BOTH WEIGHTBEARING EXERCISES AND AEROBIC ARE RECOMMENDED.  PATIENT IS ADVISED TO FOLLOW A HEALTHY DIET WITH AT LEAST SIX FRUITS/VEGGIES PER DAY, DECREASE INTAKE OF RED MEAT, AND TO INCREASE FISH INTAKE TO TWO DAYS PER WEEK.  MEATS/FISH SHOULD NOT BE FRIED, BAKED OR BROILED IS PREFERABLE.  IT IS ALSO IMPORTANT TO CUT BACK ON YOUR SUGAR INTAKE. PLEASE AVOID ANYTHING WITH ADDED SUGAR, CORN SYRUP OR OTHER SWEETENERS. IF YOU MUST USE A SWEETENER, YOU CAN TRY STEVIA. IT IS ALSO IMPORTANT TO AVOID ARTIFICIALLY SWEETENERS AND DIET BEVERAGES. LASTLY, I SUGGEST WEARING SPF 50 SUNSCREEN ON EXPOSED PARTS AND ESPECIALLY WHEN IN THE DIRECT SUNLIGHT FOR AN EXTENDED PERIOD OF TIME.  PLEASE AVOID FAST FOOD RESTAURANTS AND INCREASE YOUR WATER INTAKE. - CBC - CMP14+EGFR - Lipid panel  2. Type 2 diabetes mellitus with stage 2 chronic kidney disease, with long-term current use of insulin (HCC) Comments: Chronic, diabetic foot exam was performed.  Advised to reschedule appt with Endo. She is encouraged to incorporate more exercise into her daily routine. I DISCUSSED WITH THE PATIENT AT LENGTH REGARDING THE GOALS OF GLYCEMIC CONTROL AND POSSIBLE LONG-TERM  COMPLICATIONS.  I  ALSO STRESSED THE IMPORTANCE OF COMPLIANCE WITH HOME GLUCOSE MONITORING, DIETARY RESTRICTIONS INCLUDING AVOIDANCE OF SUGARY DRINKS/PROCESSED FOODS,  ALONG WITH REGULAR EXERCISE.  I  ALSO STRESSED THE IMPORTANCE OF ANNUAL EYE EXAMS, SELF FOOT CARE AND COMPLIANCE WITH OFFICE VISITS.  - POCT Urinalysis Dipstick (81002) - Microalbumin / Creatinine Urine Ratio - EKG 12-Lead  3. 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  Comments: Chronic, fair control. Goal BP<120/80. She will c/w losartan 25mg  daily, will consider increasing dose to 25mg . However, last BP was 106/71.  EKG performed, NSR w/ nonspecific T abnormality. She is encouraged to follow a low sodium diet.  - POCT Urinalysis Dipstick (81002) - Microalbumin / Creatinine Urine Ratio - EKG 12-Lead  4. PVD (peripheral vascular disease) (HCC) Comments: Chronic, she is s/p underwent left carotid endarterectomy with a redo left common carotid to brachial artery bypass with PTFE on 08/13/2022 for tissue loss. Importance of living a heart healthy lifestyle was discussed with the patient.   5. Pure hypercholesterolemia Comments: LDL 130. I will increase dose of medication to atorvastatin 80mg . She will take TWO atorvastatin 40mg  until she runs out.  6. Class 1 obesity due to excess calories with  serious comorbidity and body mass index (BMI) of 34.0 to 34.9 in adult She is encouraged to strive for BMI less than 30 to decrease cardiac risk. Advised to aim for at least 150 minutes of exercise per week.    Return for 1 year physical, 6 month bp. Patient was given opportunity to ask questions. Patient verbalized understanding of the plan and was able to repeat key elements of the plan. All questions were answered to their satisfaction.   I, Gwynneth Aliment, MD, have reviewed all documentation for this visit. The documentation on 05/21/23 for the exam, diagnosis, procedures, and orders are all  accurate and complete.   THE PATIENT IS ENCOURAGED TO PRACTICE SOCIAL DISTANCING DUE TO THE COVID-19 PANDEMIC.

## 2023-05-06 NOTE — Patient Instructions (Signed)

## 2023-05-07 LAB — CBC
Hematocrit: 43 % (ref 34.0–46.6)
Hemoglobin: 13.6 g/dL (ref 11.1–15.9)
MCH: 26.3 pg — ABNORMAL LOW (ref 26.6–33.0)
MCHC: 31.6 g/dL (ref 31.5–35.7)
MCV: 83 fL (ref 79–97)
Platelets: 363 10*3/uL (ref 150–450)
RBC: 5.18 x10E6/uL (ref 3.77–5.28)
RDW: 17.6 % — ABNORMAL HIGH (ref 11.7–15.4)
WBC: 7.2 10*3/uL (ref 3.4–10.8)

## 2023-05-07 LAB — LIPID PANEL
Chol/HDL Ratio: 4.5 ratio — ABNORMAL HIGH (ref 0.0–4.4)
Cholesterol, Total: 170 mg/dL (ref 100–199)
HDL: 38 mg/dL — ABNORMAL LOW (ref >39)
LDL Chol Calc (NIH): 113 mg/dL — ABNORMAL HIGH (ref 0–99)
Triglycerides: 104 mg/dL (ref 0–149)
VLDL Cholesterol Cal: 19 mg/dL (ref 5–40)

## 2023-05-07 LAB — CMP14+EGFR
ALT: 24 IU/L (ref 0–32)
AST: 19 IU/L (ref 0–40)
Albumin/Globulin Ratio: 1.2 (ref 1.2–2.2)
Albumin: 3.9 g/dL (ref 3.9–4.9)
Alkaline Phosphatase: 121 IU/L (ref 44–121)
BUN/Creatinine Ratio: 17 (ref 12–28)
BUN: 22 mg/dL (ref 8–27)
Bilirubin Total: 0.3 mg/dL (ref 0.0–1.2)
CO2: 21 mmol/L (ref 20–29)
Calcium: 9 mg/dL (ref 8.7–10.3)
Chloride: 109 mmol/L — ABNORMAL HIGH (ref 96–106)
Creatinine, Ser: 1.28 mg/dL — ABNORMAL HIGH (ref 0.57–1.00)
Globulin, Total: 3.3 g/dL (ref 1.5–4.5)
Glucose: 109 mg/dL — ABNORMAL HIGH (ref 70–99)
Potassium: 4.8 mmol/L (ref 3.5–5.2)
Sodium: 143 mmol/L (ref 134–144)
Total Protein: 7.2 g/dL (ref 6.0–8.5)
eGFR: 48 mL/min/{1.73_m2} — ABNORMAL LOW (ref >59)

## 2023-05-07 LAB — MICROALBUMIN / CREATININE URINE RATIO
Creatinine, Urine: 198.5 mg/dL
Microalb/Creat Ratio: 99 mg/g creat — ABNORMAL HIGH (ref 0–29)
Microalbumin, Urine: 197.2 ug/mL

## 2023-05-08 ENCOUNTER — Other Ambulatory Visit: Payer: Self-pay

## 2023-05-08 DIAGNOSIS — E7800 Pure hypercholesterolemia, unspecified: Secondary | ICD-10-CM

## 2023-05-08 MED ORDER — ATORVASTATIN CALCIUM 80 MG PO TABS
80.0000 mg | ORAL_TABLET | Freq: Every day | ORAL | 2 refills | Status: DC
Start: 2023-05-08 — End: 2024-01-18

## 2023-05-11 ENCOUNTER — Other Ambulatory Visit: Payer: Self-pay

## 2023-05-13 ENCOUNTER — Other Ambulatory Visit: Payer: Self-pay

## 2023-06-09 LAB — HM DIABETES EYE EXAM

## 2023-06-10 ENCOUNTER — Other Ambulatory Visit: Payer: Self-pay | Admitting: Internal Medicine

## 2023-06-11 ENCOUNTER — Other Ambulatory Visit: Payer: Self-pay

## 2023-06-11 MED ORDER — GABAPENTIN 300 MG PO CAPS: ORAL_CAPSULE | ORAL | 2 refills | Status: DC

## 2023-06-12 ENCOUNTER — Other Ambulatory Visit: Payer: Self-pay

## 2023-06-16 ENCOUNTER — Ambulatory Visit: Payer: BC Managed Care – PPO

## 2023-06-16 ENCOUNTER — Ambulatory Visit (HOSPITAL_COMMUNITY): Payer: BC Managed Care – PPO

## 2023-06-29 ENCOUNTER — Ambulatory Visit (INDEPENDENT_AMBULATORY_CARE_PROVIDER_SITE_OTHER)
Admission: RE | Admit: 2023-06-29 | Discharge: 2023-06-29 | Disposition: A | Discharge status: Home / Self Care | Payer: BC Managed Care – PPO | Source: Ambulatory Visit | Attending: Vascular Surgery | Admitting: Vascular Surgery

## 2023-06-29 ENCOUNTER — Ambulatory Visit (HOSPITAL_COMMUNITY)
Admission: RE | Admit: 2023-06-29 | Discharge: 2023-06-29 | Disposition: A | Discharge status: Home / Self Care | Payer: BC Managed Care – PPO | Source: Ambulatory Visit | Attending: Vascular Surgery | Admitting: Vascular Surgery

## 2023-06-29 DIAGNOSIS — I739 Peripheral vascular disease, unspecified: Secondary | ICD-10-CM | POA: Diagnosis not present

## 2023-06-29 DIAGNOSIS — I998 Other disorder of circulatory system: Secondary | ICD-10-CM | POA: Insufficient documentation

## 2023-06-29 DIAGNOSIS — I6523 Occlusion and stenosis of bilateral carotid arteries: Secondary | ICD-10-CM | POA: Insufficient documentation

## 2023-07-09 ENCOUNTER — Other Ambulatory Visit: Payer: Self-pay

## 2023-07-13 ENCOUNTER — Other Ambulatory Visit: Payer: Self-pay

## 2023-08-06 ENCOUNTER — Other Ambulatory Visit: Payer: Self-pay

## 2023-08-07 ENCOUNTER — Other Ambulatory Visit: Payer: Self-pay

## 2023-08-11 ENCOUNTER — Ambulatory Visit (INDEPENDENT_AMBULATORY_CARE_PROVIDER_SITE_OTHER): Payer: BC Managed Care – PPO | Admitting: Physician Assistant

## 2023-08-11 ENCOUNTER — Other Ambulatory Visit: Payer: Self-pay

## 2023-08-11 ENCOUNTER — Encounter: Payer: Self-pay | Admitting: Physician Assistant

## 2023-08-11 VITALS — BP 103/74 | HR 58 | Temp 98.0°F | Resp 18 | Ht 63.0 in | Wt 199.0 lb

## 2023-08-11 DIAGNOSIS — T82898A Other specified complication of vascular prosthetic devices, implants and grafts, initial encounter: Secondary | ICD-10-CM | POA: Diagnosis not present

## 2023-08-11 NOTE — Progress Notes (Signed)
HISTORY AND PHYSICAL     CC:  follow up. Requesting Provider:  Dorothyann Peng, MD  HPI: This is a 62 y.o. female who is here today for follow up for hx of  left carotid endarterectomy with a redo left common carotid artery to brachial artery bypass with PTFE on 08/13/2022 by Dr. Chestine Spore.  This was for an occluded left common carotid to brachial artery bypass with vein in the setting of recurrent tissue loss. Post op she developed some breakdown of her antecubital incision that required I&D and evacuation of the seroma. She was found to have MSSA on surgical cultures. She was treated with 1.5 months of Bactrim.    She previously underwent left CCA to brachial artery bypass with reversed left GSV on 08/12/2021 by Dr. Chestine Spore.   Pt was last seen on 12/15/2022.  At that time, her arm was feeling better and her wounds had healed.  She was not able to flex her 2nd finger but was present over a year.  She did not have any neurological changes.   She did have some claudication with walking about 250-300 yards.  She did not have rest pain or tissue loss.    The pt returns today for follow up.  She states she is doing well.  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  She also works for the [employer].   Pt denies any amaurosis fugax, speech difficulties, weakness, numbness, paralysis or clumsiness or facial droop.    Pt does have some right leg claudication that improves with about 5 minutes of rest.  She does not have rest pain, or non healing wounds.  She does have feeling in her feet and trims her toenails.   She denies any pain or motor or sensory loss in the right hand.  Fortunately she has never smoked.  She states that she has a + family hx and her daughters do not smoke and go to workout regularly for prevention.  She is compliant with her statin and asa.    She is from [place], [place].   The pt is on a statin for cholesterol management.    The pt is  on an aspirin.    Other AC:  none The pt is on ARB for hypertension.  The pt is  on medication for diabetes. Tobacco hx:  never  Pt does not have family hx of AAA.    Past Medical History:  Diagnosis Date   Arthritis    Chronic kidney disease    Complication of anesthesia    Diabetes mellitus (HCC)    GERD (gastroesophageal reflux disease)    Heart murmur    when 18 per patient   Hyperlipidemia    Hypertension    Neuropathy    Pneumonia    PONV (postoperative nausea and vomiting)    PVD (peripheral vascular disease) (HCC)     Past Surgical History:  Procedure Laterality Date   AORTIC ARCH ANGIOGRAPHY N/A 08/01/2021   Procedure: AORTIC ARCH ANGIOGRAPHY;  Surgeon: Cephus Shelling, MD;  Location: [facility];  Service: Cardiovascular;  Laterality: N/A;   AORTIC ARCH ANGIOGRAPHY N/A 08/07/2022   Procedure: AORTIC ARCH ANGIOGRAPHY;  Surgeon: Cephus Shelling, MD;  Location: [facility];  Service: Cardiovascular;  Laterality: N/A;   CAROTID-SUBCLAVIAN BYPASS GRAFT Left 08/12/2021   Procedure: LEFT CAROTID-BRACHIAL ARTERY BYPASS using Left greater saphenous Vein, harvested from left leg.;  Surgeon: Sherald Hess  J, MD;  Location: [facility] OR;  Service: Vascular;  Laterality: Left;   CAROTID-SUBCLAVIAN BYPASS GRAFT Left 08/13/2022   Procedure: REDO LEFT COMMON CAROTID-BRACHIAL ARTERY BYPASS USING PROPATEN GRAFT.;  Surgeon: Cephus Shelling, MD;  Location: [facility] OR;  Service: Vascular;  Laterality: Left;   CARPAL TUNNEL RELEASE Left 01/31/2021   CARPAL TUNNEL RELEASE Right 2021   Dr. Dion Saucier   COLONOSCOPY WITH PROPOFOL N/A 12/04/2022   Procedure: COLONOSCOPY WITH PROPOFOL;  Surgeon: Meryl Dare, MD;  Location: [facility];  Service: Gastroenterology;  Laterality: N/A;   DILATION AND CURETTAGE OF UTERUS  2007   ENDARTERECTOMY Left 08/13/2022   Procedure: ENDARTERECTOMY CAROTID WITH BOVINE PATCH.;  Surgeon: Cephus Shelling, MD;  Location: [facility];  Service:  Vascular;  Laterality: Left;   INCISION AND DRAINAGE Left 09/17/2022   Procedure: INCISION AND DRAINAGE LEFT ARM;  Surgeon: Cephus Shelling, MD;  Location: [facility] OR;  Service: Vascular;  Laterality: Left;   POLYPECTOMY  12/04/2022   Procedure: POLYPECTOMY;  Surgeon: Meryl Dare, MD;  Location: [facility];  Service: Gastroenterology;;   ROTATOR CUFF REPAIR Right 2018   Dr. Dion Saucier   TUBAL LIGATION  1992   UPPER EXTREMITY ANGIOGRAPHY Left 08/01/2021   Procedure: UPPER EXTREMITY ANGIOGRAPHY;  Surgeon: Cephus Shelling, MD;  [facility]: [facility];  Service: Cardiovascular;  Laterality: Left;   UPPER EXTREMITY ANGIOGRAPHY Left 08/07/2022   Procedure: Upper Extremity Angiography;  Surgeon: Cephus Shelling, MD;  Location: [facility] INVASIVE CV LAB;  Service: Cardiovascular;  Laterality: Left;    Allergies  Allergen Reactions   Promethazine Hcl Shortness Of Breath and Other (See Comments)    Bells palsy   Lisinopril Itching and Swelling    Current Outpatient Medications  Medication Sig Dispense Refill   atorvastatin (LIPITOR) 80 MG tablet Take 1 tablet (80 mg total) by mouth daily. 90 tablet 2   benzonatate (TESSALON) 100 MG capsule Take 1 capsule (100 mg total) by mouth 3 (three) times daily as needed. 21 capsule 0   Blood Glucose Monitoring Suppl (ONETOUCH VERIO FLEX SYSTEM) w/Device KIT Use as directed to check blood sugars 2 times per day dx: e11.65 1 kit 1   cephALEXin (KEFLEX) 500 MG capsule Take 1 capsule (500 mg total) by mouth 4 (four) times daily. (Patient not taking: Reported on 03/04/2023) 20 capsule 0   Cholecalciferol (DIALYVITE VITAMIN D 5000 PO) Take 10,000 Units by mouth at bedtime.     Continuous Blood Gluc Sensor (DEXCOM G7 SENSOR) MISC USE FOR CONTINUOUS BLOOD GLUCOSE MONITORING,REPLACE EVERY 10 DAYS     diclofenac Sodium (VOLTAREN) 1 % GEL Apply 1 Application topically 3 (three) times daily as needed (joint pain). Use as directed, apply to affected joints tid prn  100 g 1   empagliflozin (JARDIANCE) 25 MG TABS tablet Take 1 tablet (25 mg total) by mouth daily before breakfast. 90 tablet 3   ferrous sulfate 325 (65 FE) MG tablet TAKE 1 TABLET BY MOUTH EVERY DAY WITH BREAKFAST (Patient taking differently: Take 325 mg by mouth at bedtime.) 90 tablet 1   gabapentin (NEURONTIN) 300 MG capsule TAKE 1 CAPSULE BY MOUTH 3 (THREE) TIMES DAILY. TAKE 2 CAPSULES TO EQUAL 600MG  AT NIGHT. 180 capsule 2   glucose blood (ONETOUCH VERIO) test strip USE TO CHECK BLOOD SUGAR TWICE A DAY 100 strip 12   insulin aspart (NOVOLOG FLEXPEN) 100 UNIT/ML FlexPen Inject 12 Units into the skin 3 (three) times daily with meals. 30 mL 6  insulin degludec (TRESIBA FLEXTOUCH) 200 UNIT/ML FlexTouch Pen Inject 70 Units into the skin every evening. 30 mL 3   Insulin Pen Needle 32G X 4 MM MISC 1 Device by Does not apply route in the morning, at noon, in the evening, and at bedtime. 400 each 3   losartan (COZAAR) 25 MG tablet Take 1 tablet (25 mg total) by mouth daily. 90 tablet 3   MAGNESIUM PO Take 1 tablet by mouth daily.     Multiple Vitamin (MULTIVITAMIN WITH MINERALS) TABS tablet Take 1 tablet by mouth daily.     mupirocin cream (BACTROBAN) 2 % Apply 1 Application topically 2 (two) times daily. 15 g 0   nystatin-triamcinolone (MYCOLOG II) cream APPLY TO THE AFFECTED AREAS 2 TIMES PER DAY IN THE MORNING AND EVENING FOR 2 WEEKS AS NEEDED     Omega-3 Fatty Acids (FISH OIL PO) Take 1 capsule by mouth in the morning.     omeprazole (PRILOSEC) 40 MG capsule Take 1 capsule (40 mg total) by mouth daily. 90 capsule 2   sertraline (ZOLOFT) 50 MG tablet TAKE 1 TABLET BY MOUTH EVERYDAY AT BEDTIME 90 tablet 1   tirzepatide (MOUNJARO) 7.5 MG/0.5ML Pen Inject 7.5 mg into the skin once a week. 6 mL 3   Turmeric 500 MG CAPS Take 500 mg by mouth at bedtime.     valACYclovir (VALTREX) 500 MG tablet TAKE 1 TABLET (500 MG TOTAL) BY MOUTH IN THE MORNING 90 tablet 1   No current facility-administered  medications for this visit.    Family History  Problem Relation Age of Onset   Kidney failure Mother    Liver disease Mother    Early death Father    Hypertension Brother    Colon cancer Neg Hx    Stomach cancer Neg Hx    Colon polyps Neg Hx    Esophageal cancer Neg Hx    Ulcerative colitis Neg Hx     Social History   Socioeconomic History   Marital status: Single    Spouse name: Not on file   Number of children: Not on file   Years of education: Not on file   Highest education level: Not on file  Occupational History   Not on file  Tobacco Use   Smoking status: Never   Smokeless tobacco: Never  Vaping Use   Vaping status: Never Used  Substance and Sexual Activity   Alcohol use: Yes    Comment: occasional   Drug use: Never   Sexual activity: Not on file  Other Topics Concern   Not on file  Social History Narrative   Not on file   Social Determinants of Health   Financial Resource Strain: Not on file  Food Insecurity: Not on file  Transportation Needs: Not on file  Physical Activity: Not on file  Stress: Not on file  Social Connections: Not on file  Intimate Partner Violence: Not on file     REVIEW OF SYSTEMS:   [X]  denotes positive finding, [ ]  denotes negative finding Cardiac  Comments:  Chest pain or chest pressure:    Shortness of breath upon exertion:    Short of breath when lying flat:    Irregular heart rhythm:        Vascular    Pain in calf, thigh, or hip brought on by ambulation: x See HPI  Pain in feet at night that wakes you up from your sleep:     Blood clot in your veins:  Leg swelling:         Pulmonary    Oxygen at home:    Wheezing:         Neurologic    Sudden weakness in arms or legs:     Sudden numbness in arms or legs:     Sudden onset of difficulty speaking or understanding others    Temporary loss of vision in one eye:     Problems with dizziness:         Gastrointestinal    Blood in stool:     Vomited blood:          Genitourinary    Burning when urinating:     Blood in urine:        Psychiatric    Major depression:         Hematologic    Bleeding problems:    Problems with blood clotting too easily:        Skin    Rashes or ulcers:        Constitutional    Fever or chills:      PHYSICAL EXAMINATION:  Today's Vitals   08/11/23 1007  BP: 103/74  Pulse: (!) 58  Resp: 18  Temp: 98 F (36.7 C)  TempSrc: Temporal  SpO2: 90%  Weight: 199 lb (90.3 kg)  Height: 5\' 3"  (1.6 m)   Body mass index is 35.25 kg/m.   General:  WDWN in NAD; vital signs documented above Gait: Not observed HENT: WNL, normocephalic Pulmonary: normal non-labored breathing  Cardiac: regular HR;  with carotid bruits bilaterally Abdomen: soft, NT, aortic pulse is not palpable Skin: without rashes Vascular Exam/Pulses:  Right Left  Radial Faint doppler signal 2+ (normal)  Popliteal Unable to palpate Unable to palpate  DP + doppler + doppler  PT + doppler + doppler   Extremities: bilateral feet are warm and well perfused without tissue loss.  Bilateral hands warm.  Musculoskeletal: no muscle wasting or atrophy  Neurologic: A&O X 3;  speech is fluent/normal; moving all extremities equally  Psychiatric:  The pt has Normal affect.   Non-Invasive Vascular Imaging:   ABI's/TBI's on 06/29/2023: Right:  restricted extremity - great toe pressure:  46 Left:  restricted extremity - great toe pressure:  84   Arterial duplex on 06/29/2023: Left: Patent carotid-brachial bypass graft with no visualized stenosis.   Carotid Duplex on 06/29/2023: Right:  1-39% ICA stenosis Left:  1-39% ICA stenosis Vertebrals: Bilateral vertebral arteries demonstrate antegrade flow.   Previous ABI's/TBI's on 07/01/2021: Right:  1.24/0- great toe pressure:  0 Left:  1.22/0 - great toe pressure:  0  Previous Carotid duplex on 12/15/2022: Right: 1-39% ICA stenosis Left:   1-39% ICA stenosis    ASSESSMENT/PLAN:: 62 y.o. female  here for follow up for hx of left carotid endarterectomy with a redo left common carotid artery to brachial artery bypass with PTFE on 08/13/2022 by Dr. Chestine Spore.  This was for an occluded left common carotid to brachial artery bypass with vein in the setting of recurrent tissue loss.    PAD -toe pressures improved from previous ABI.   -pt does not have rest pain or non healing wounds.  She does have claudication of the right leg that improves with about 5 minutes of rest.   -continue graduated walking program.  She does have a gym that she can walk some laps.  Encouraged her to continue to active.  -discussed protecting her feet.  -pt will f/u in one  year with ABI  Carotid stenosis -duplex reveals patent bypass without stenosis and bilateral 1-39% ICA stenosis. She does have bilateral carotid bruits.  -discussed s/s of stroke with pt and she understands should she develop any of these sx, she will go to the nearest ER or call 911. -she does have a faint doppler signal in the right radial but she denies any motor or sensory loss in the right hand.  Discussed with her that should anything change to let us know.  She has easily palpable left radial pulse.  -pt will f/u in one year with carotid duplex and LUE arterial duplex.   -continue statin/asa    Doreatha Massed, [facility] [phone]  Clinic MD:   Chestine Spore

## 2023-08-12 ENCOUNTER — Other Ambulatory Visit (HOSPITAL_COMMUNITY): Payer: Self-pay

## 2023-08-12 ENCOUNTER — Telehealth: Payer: Self-pay

## 2023-08-12 ENCOUNTER — Other Ambulatory Visit: Payer: Self-pay

## 2023-08-12 NOTE — Telephone Encounter (Signed)
Mounjaro needs PA  

## 2023-08-12 NOTE — Telephone Encounter (Signed)
Pharmacy Patient Advocate Encounter   Received notification from Pt Calls Messages that prior authorization for Meagan [name] is required/requested.   Insurance verification completed.   The patient is insured through [facility] .   Per test claim: PA required; PA submitted to [facility] via CoverMyMeds Key/confirmation #/EOC [id number] Status is pending

## 2023-08-14 ENCOUNTER — Other Ambulatory Visit: Payer: Self-pay

## 2023-08-17 ENCOUNTER — Other Ambulatory Visit: Payer: Self-pay

## 2023-08-17 NOTE — Telephone Encounter (Signed)
Pharmacy Patient Advocate Encounter  Received notification from [facility] that Prior Authorization for Greggory Keen has been APPROVED through 08/10/2026   PA #/Case ID/Reference #: [id number]

## 2023-08-19 ENCOUNTER — Other Ambulatory Visit: Payer: Self-pay

## 2023-08-19 DIAGNOSIS — I739 Peripheral vascular disease, unspecified: Secondary | ICD-10-CM

## 2023-08-19 DIAGNOSIS — I6523 Occlusion and stenosis of bilateral carotid arteries: Secondary | ICD-10-CM

## 2023-08-27 ENCOUNTER — Other Ambulatory Visit: Payer: Self-pay

## 2023-08-27 ENCOUNTER — Other Ambulatory Visit: Payer: BC Managed Care – PPO

## 2023-08-27 DIAGNOSIS — Z794 Long term (current) use of insulin: Secondary | ICD-10-CM

## 2023-08-31 LAB — HEMOGLOBIN A1C
Est. average glucose Bld gHb Est-mCnc: 148 mg/dL
Hgb A1c MFr Bld: 6.8 % — ABNORMAL HIGH (ref 4.8–5.6)

## 2023-09-04 ENCOUNTER — Ambulatory Visit (INDEPENDENT_AMBULATORY_CARE_PROVIDER_SITE_OTHER): Payer: BC Managed Care – PPO | Admitting: Internal Medicine

## 2023-09-04 ENCOUNTER — Encounter: Payer: Self-pay | Admitting: Internal Medicine

## 2023-09-04 VITALS — BP 102/72 | HR 74 | Ht 63.0 in | Wt 195.0 lb

## 2023-09-04 DIAGNOSIS — E1122 Type 2 diabetes mellitus with diabetic chronic kidney disease: Secondary | ICD-10-CM | POA: Diagnosis not present

## 2023-09-04 DIAGNOSIS — Z794 Long term (current) use of insulin: Secondary | ICD-10-CM | POA: Diagnosis not present

## 2023-09-04 DIAGNOSIS — E1142 Type 2 diabetes mellitus with diabetic polyneuropathy: Secondary | ICD-10-CM | POA: Insufficient documentation

## 2023-09-04 DIAGNOSIS — N1832 Chronic kidney disease, stage 3b: Secondary | ICD-10-CM

## 2023-09-04 MED ORDER — NOVOLOG FLEXPEN 100 UNIT/ML ~~LOC~~ SOPN
12.0000 [IU] | PEN_INJECTOR | Freq: Three times a day (TID) | SUBCUTANEOUS | 6 refills | Status: DC
Start: 2023-09-04 — End: 2024-03-04

## 2023-09-04 MED ORDER — TIRZEPATIDE 7.5 MG/0.5ML ~~LOC~~ SOAJ: 7.5000 mg | SUBCUTANEOUS | 3 refills | Status: DC

## 2023-09-04 MED ORDER — LOSARTAN POTASSIUM 25 MG PO TABS
25.0000 mg | ORAL_TABLET | Freq: Every day | ORAL | 3 refills | Status: DC
Start: 2023-09-04 — End: 2024-03-04

## 2023-09-04 MED ORDER — TRESIBA FLEXTOUCH 200 UNIT/ML ~~LOC~~ SOPN: 64.0000 [IU] | PEN_INJECTOR | Freq: Every evening | SUBCUTANEOUS | 3 refills | Status: DC

## 2023-09-04 MED ORDER — EMPAGLIFLOZIN 25 MG PO TABS: 25.0000 mg | ORAL_TABLET | Freq: Every day | ORAL | 3 refills | Status: DC

## 2023-09-04 NOTE — Progress Notes (Signed)
Name: Meagan [name]  MRN/ DOB: [id number], Apr 25, 1961   Age/ Sex: 62 y.o., female    PCP: Dorothyann Peng, MD   Reason for Endocrinology Evaluation: Type 2 Diabetes Mellitus     Date of Initial Endocrinology Visit: 03/04/2023    PATIENT IDENTIFIER: Meagan [name] is a 62 y.o. female with a past medical history of HTN, PVD, DM. The patient presented for initial endocrinology clinic visit on 09/04/2023 for consultative assistance with her diabetes management.    HPI: Meagan [name] was    Diagnosed with DM 2005 Prior Medications tried/Intolerance: Metformin -no intolerance , Janumet - no intolerance  , victoza - not effective . Ozempic - nausea                Hemoglobin A1c has ranged from 6.5% in 2019, peaking at 11.2% in 2023.  On her initial visit to our clinic she had an A1c 8.2% , she was already on Jardiance, Mounjaro, basal/prandial insulin   SUBJECTIVE:   During the last visit (03/04/2023): A1c 8.2%   Today (09/04/23): Meagan [name] is here for a follow up on diabetes management. She checks her blood sugars multiple  times daily. The patient has  had hypoglycemic episodes since the last clinic visit..  Patient continues with weight loss Denies constipation or constipation      HOME DIABETES REGIMEN: Jardiance 25 mg daily Mounjaro 7.5 mg weekly Tresiba 70 units daily  NovoLog 12 units with each meal  Losartan 25 mg daily    Statin: yes ACE-I/ARB: no    METER DOWNLOAD SUMMARY:    DIABETIC COMPLICATIONS: Microvascular complications:  CKD, neuropathy Denies: retinopathy Last eye exam: Completed 2023  Macrovascular complications:  PVD Denies: CAD, CVA   PAST HISTORY: Past Medical History:  Past Medical History:  Diagnosis Date   Arthritis    Chronic kidney disease    Complication of anesthesia    Diabetes mellitus (HCC)    GERD (gastroesophageal reflux disease)    Heart murmur    when 18 per patient   Hyperlipidemia    Hypertension     Neuropathy    Pneumonia    PONV (postoperative nausea and vomiting)    PVD (peripheral vascular disease) (HCC)    Past Surgical History:  Past Surgical History:  Procedure Laterality Date   AORTIC ARCH ANGIOGRAPHY N/A 08/01/2021   Procedure: AORTIC ARCH ANGIOGRAPHY;  Surgeon: Cephus Shelling, MD;  Location: [facility];  Service: Cardiovascular;  Laterality: N/A;   AORTIC ARCH ANGIOGRAPHY N/A 08/07/2022   Procedure: AORTIC ARCH ANGIOGRAPHY;  Surgeon: Cephus Shelling, MD;  Location: [facility];  Service: Cardiovascular;  Laterality: N/A;   CAROTID-SUBCLAVIAN BYPASS GRAFT Left 08/12/2021   Procedure: LEFT CAROTID-BRACHIAL ARTERY BYPASS using Left greater saphenous Vein, harvested from left leg.;  Surgeon: Cephus Shelling, MD;  Location: [facility] OR;  Service: Vascular;  Laterality: Left;   CAROTID-SUBCLAVIAN BYPASS GRAFT Left 08/13/2022   Procedure: REDO LEFT COMMON CAROTID-BRACHIAL ARTERY BYPASS USING PROPATEN GRAFT.;  Surgeon: Cephus Shelling, MD;  Location: [facility] OR;  Service: Vascular;  Laterality: Left;   CARPAL TUNNEL RELEASE Left 01/31/2021   CARPAL TUNNEL RELEASE Right 2021   Dr. Dion Saucier   COLONOSCOPY WITH PROPOFOL N/A 12/04/2022   Procedure: COLONOSCOPY WITH PROPOFOL;  Surgeon: Meryl Dare, MD;  Location: [facility];  Service: Gastroenterology;  Laterality: N/A;   DILATION AND CURETTAGE OF UTERUS  2007   ENDARTERECTOMY Left 08/13/2022   Procedure: ENDARTERECTOMY CAROTID WITH BOVINE PATCH.;  Surgeon: Cephus Shelling, MD;  Location: [facility] OR;  Service: Vascular;  Laterality: Left;   INCISION AND DRAINAGE Left 09/17/2022   Procedure: INCISION AND DRAINAGE LEFT ARM;  Surgeon: Cephus Shelling, MD;  Location: [facility] OR;  Service: Vascular;  Laterality: Left;   POLYPECTOMY  12/04/2022   Procedure: POLYPECTOMY;  Surgeon: Meryl Dare, MD;  Location: [facility];  Service: Gastroenterology;;   ROTATOR CUFF REPAIR Right 2018   Dr. Dion Saucier   TUBAL LIGATION  1992    UPPER EXTREMITY ANGIOGRAPHY Left 08/01/2021   Procedure: UPPER EXTREMITY ANGIOGRAPHY;  Surgeon: Cephus Shelling, MD;  Location: [facility];  Service: Cardiovascular;  Laterality: Left;   UPPER EXTREMITY ANGIOGRAPHY Left 08/07/2022   Procedure: Upper Extremity Angiography;  Surgeon: Cephus Shelling, MD;  Location: [facility] INVASIVE CV LAB;  Service: Cardiovascular;  Laterality: Left;    Social History:  reports that she has never smoked. She has never used smokeless tobacco. She reports current alcohol use. She reports that she does not use drugs. Family History:  Family History  Problem Relation Age of Onset   Kidney failure Mother    Liver disease Mother    Early death Father    Hypertension Brother    Colon cancer Neg Hx    Stomach cancer Neg Hx    Colon polyps Neg Hx    Esophageal cancer Neg Hx    Ulcerative colitis Neg Hx      HOME MEDICATIONS: Allergies as of 09/04/2023       Reactions   Promethazine Hcl Shortness Of Breath, Other (See Comments)   Bells palsy   Lisinopril Itching, Swelling        Medication List        Accurate as of September 04, 2023  2:57 PM. If you have any questions, ask your nurse or doctor.          atorvastatin 80 MG tablet Commonly known as: Lipitor Take 1 tablet (80 mg total) by mouth daily.   benzonatate 100 MG capsule Commonly known as: TESSALON Take 1 capsule (100 mg total) by mouth 3 (three) times daily as needed.   cephALEXin 500 MG capsule Commonly known as: KEFLEX Take 1 capsule (500 mg total) by mouth 4 (four) times daily.   Dexcom G7 Sensor Misc USE FOR CONTINUOUS BLOOD GLUCOSE MONITORING,REPLACE EVERY 10 DAYS   DIALYVITE VITAMIN D 5000 PO Take 10,000 Units by mouth at bedtime.   diclofenac Sodium 1 % Gel Commonly known as: VOLTAREN Apply 1 Application topically 3 (three) times daily as needed (joint pain). Use as directed, apply to affected joints tid prn   empagliflozin 25 MG Tabs tablet Commonly known  as: Jardiance Take 1 tablet (25 mg total) by mouth daily before breakfast.   ferrous sulfate 325 (65 FE) MG tablet TAKE 1 TABLET BY MOUTH EVERY DAY WITH BREAKFAST What changed: See the new instructions.   FISH OIL PO Take 1 capsule by mouth in the morning.   gabapentin 300 MG capsule Commonly known as: NEURONTIN TAKE 1 CAPSULE BY MOUTH 3 (THREE) TIMES DAILY. TAKE 2 CAPSULES TO EQUAL 600MG  AT NIGHT.   Insulin Pen Needle 32G X 4 MM Misc 1 Device by Does not apply route in the morning, at noon, in the evening, and at bedtime.   losartan 25 MG tablet Commonly known as: COZAAR Take 1 tablet (25 mg total) by mouth daily.   MAGNESIUM PO Take 1 tablet by mouth daily.   Mounjaro 7.5  MG/0.5ML Pen Generic drug: tirzepatide Inject 7.5 mg into the skin once a week.   multivitamin with minerals Tabs tablet Take 1 tablet by mouth daily.   mupirocin cream 2 % Commonly known as: BACTROBAN Apply 1 Application topically 2 (two) times daily.   NovoLOG FlexPen 100 UNIT/ML FlexPen Generic drug: insulin aspart Inject 12 Units into the skin 3 (three) times daily with meals.   nystatin-triamcinolone cream Commonly known as: MYCOLOG II APPLY TO THE AFFECTED AREAS 2 TIMES PER DAY IN THE MORNING AND EVENING FOR 2 WEEKS AS NEEDED   omeprazole 40 MG capsule Commonly known as: PRILOSEC Take 1 capsule (40 mg total) by mouth daily.   OneTouch Verio Flex System w/Device Kit Use as directed to check blood sugars 2 times per day dx: e11.65   OneTouch Verio test strip Generic drug: glucose blood USE TO CHECK BLOOD SUGAR TWICE A DAY   sertraline 50 MG tablet Commonly known as: ZOLOFT TAKE 1 TABLET BY MOUTH EVERYDAY AT BEDTIME   Tresiba FlexTouch 200 UNIT/ML FlexTouch Pen Generic drug: insulin degludec Inject 70 Units into the skin every evening.   Turmeric 500 MG Caps Take 500 mg by mouth at bedtime.   valACYclovir 500 MG tablet Commonly known as: VALTREX TAKE 1 TABLET (500 MG TOTAL) BY  MOUTH IN THE MORNING         ALLERGIES: Allergies  Allergen Reactions   Promethazine Hcl Shortness Of Breath and Other (See Comments)    Bells palsy   Lisinopril Itching and Swelling     REVIEW OF SYSTEMS: A comprehensive ROS was conducted with the patient and is negative except as per HPI    OBJECTIVE:   VITAL SIGNS: BP 102/72 (BP Location: Left Arm, Patient Position: Sitting, Cuff Size: Small)   Pulse 74   Ht 5\' 3"  (1.6 m)   Wt 195 lb (88.5 kg)   SpO2 99%   BMI 34.54 kg/m    PHYSICAL EXAM:  General: Pt appears well and is in NAD  Lungs: Clear with good BS bilat   Heart: RRR   Extremities:  Lower extremities - No pretibial edema.   Neuro: MS is good with appropriate affect, pt is alert and Ox3    DM foot exam: 03/04/2023  The skin of the feet is intact without sores or ulcerations. The pedal pulses are undetectable  The sensation is intact to a screening 5.07, 10 gram monofilament bilaterally   DATA REVIEWED:  Lab Results  Component Value Date   HGBA1C 6.8 (H) 08/27/2023   HGBA1C 8.2 (A) 03/04/2023   HGBA1C 10.0 (H) 11/03/2022    Latest Reference Range & Units 05/06/23 12:16  Sodium 134 - 144 mmol/L 143  Potassium 3.5 - 5.2 mmol/L 4.8  Chloride 96 - 106 mmol/L 109 (H)  CO2 20 - 29 mmol/L 21  Glucose 70 - 99 mg/dL 098 (H)  BUN 8 - 27 mg/dL 22  Creatinine 1.19 - 1.47 mg/dL 8.29 (H)  Calcium 8.7 - 10.3 mg/dL 9.0  BUN/Creatinine Ratio 12 - 28  17  eGFR >59 mL/min/1.73 48 (L)  Alkaline Phosphatase 44 - 121 IU/L 121  Albumin 3.9 - 4.9 g/dL 3.9  Albumin/Globulin Ratio 1.2 - 2.2  1.2  AST 0 - 40 IU/L 19  ALT 0 - 32 IU/L 24  Total Protein 6.0 - 8.5 g/dL 7.2  Total Bilirubin 0.0 - 1.2 mg/dL 0.3  Total CHOL/HDL Ratio 0.0 - 4.4 ratio 4.5 (H)  Cholesterol, Total 100 - 199 mg/dL 562  HDL Cholesterol >39 mg/dL 38 (L)  MICROALB/CREAT RATIO 0 - 29 mg/g creat 99 (H)  Triglycerides 0 - 149 mg/dL 161  VLDL Cholesterol Cal 5 - 40 mg/dL 19  LDL Chol Calc (NIH) 0  - 99 mg/dL 096 (H)  (H): Data is abnormally high (L): Data is abnormally low   Old records , labs and images have been reviewed.    ASSESSMENT / PLAN / RECOMMENDATIONS:   1) Type 2 Diabetes Mellitus, Optimally controlled, With neuropathic and CKD complications - Most recent A1c of 6.8 %. Goal A1c < 7.0 %.     -A1c remains at goal - Unable to download dexcom , but I have manually looked at the past 24 hours, patient has been noted with overnight hypoglycemia, will decrease Evaristo Bury as below She is tolerating Mounjaro does not want to increase the dose at this time   MEDICATIONS: Continue Jardiance 25 mg daily Continue Mounjaro 7.5 mg weekly Decrease Tresiba 64 units daily Continue NovoLog 12 units 3 times daily before every meal  EDUCATION / INSTRUCTIONS: BG monitoring instructions: Patient is instructed to check her blood sugars 3 times a day, before each meal. Call [facility] if: BG persistently < 70  I reviewed the Rule of 15 for the treatment of hypoglycemia in detail with the patient. Literature supplied.   2) Diabetic complications:  Eye: Does not have known diabetic retinopathy.  Neuro/ Feet: Does  have known diabetic peripheral neuropathy. Renal: Patient does  have known baseline CKD. She is not on an ACEI/ARB at present.    3) CKD III:  -She has intolerance to lisinopril with pruritus and swelling -I have recommended losartan for renal protection  Medication  Continue losartan 25 mg daily  Signed electronically by: Lyndle Herrlich, MD  [facility]  [facility] [address], [address] [place], [place] [address] Phone: [phone] FAX: [phone]   CC: Dorothyann Peng, MD [address] [place] [address] Phone: [phone]  Fax: [phone]    Return to Endocrinology clinic as below: Future Appointments  Date Time Provider Department Center  11/04/2023 11:40 AM Dorothyann Peng, MD  [facility] None  05/10/2024 11:00 AM Dorothyann Peng, MD [facility] None

## 2023-09-04 NOTE — Patient Instructions (Addendum)
Take Jardiance 25 mg, 1 tablet every morning  Continue  Mounjaro 7.5 mg once weekly  Decrease  Tresiba 64 units daily  Continue NovoLog 12 units with each meal    Continue  Losartan 25 mg daily ( to protect kidneys )   HOW TO TREAT LOW BLOOD SUGARS (Blood sugar LESS THAN 70 MG/DL) Please follow the RULE OF 15 for the treatment of hypoglycemia treatment (when your (blood sugars are less than 70 mg/dL)   STEP 1: Take 15 grams of carbohydrates when your blood sugar is low, which includes:  3-4 GLUCOSE TABS  OR 3-4 OZ OF JUICE OR REGULAR SODA OR ONE TUBE OF GLUCOSE GEL    STEP 2: RECHECK blood sugar in 15 MINUTES STEP 3: If your blood sugar is still low at the 15 minute recheck --> then, go back to STEP 1 and treat AGAIN with another 15 grams of carbohydrates.

## 2023-09-18 ENCOUNTER — Other Ambulatory Visit: Payer: Self-pay | Admitting: Internal Medicine

## 2023-09-21 ENCOUNTER — Other Ambulatory Visit: Payer: Self-pay

## 2023-09-21 ENCOUNTER — Other Ambulatory Visit: Payer: Self-pay | Admitting: Internal Medicine

## 2023-09-21 MED ORDER — MOUNJARO 7.5 MG/0.5ML ~~LOC~~ SOAJ
7.5000 mg | SUBCUTANEOUS | 3 refills | Status: DC
  Filled 2023-09-21: qty 2, 28d supply, fill #0
  Filled 2023-10-28: qty 2, 28d supply, fill #1

## 2023-09-22 ENCOUNTER — Other Ambulatory Visit: Payer: Self-pay

## 2023-10-08 ENCOUNTER — Encounter: Payer: Self-pay | Admitting: Family Medicine

## 2023-10-08 ENCOUNTER — Ambulatory Visit: Payer: BC Managed Care – PPO | Admitting: Family Medicine

## 2023-10-08 ENCOUNTER — Ambulatory Visit
Admission: RE | Admit: 2023-10-08 | Discharge: 2023-10-08 | Disposition: A | Discharge status: Home / Self Care | Payer: BC Managed Care – PPO | Source: Ambulatory Visit | Attending: Family Medicine | Admitting: Family Medicine

## 2023-10-08 VITALS — BP 124/82 | HR 83 | Temp 98.1°F | Ht 63.0 in | Wt 199.0 lb

## 2023-10-08 DIAGNOSIS — U071 COVID-19: Secondary | ICD-10-CM | POA: Insufficient documentation

## 2023-10-08 DIAGNOSIS — R059 Cough, unspecified: Secondary | ICD-10-CM

## 2023-10-08 DIAGNOSIS — J9801 Acute bronchospasm: Secondary | ICD-10-CM | POA: Diagnosis not present

## 2023-10-08 MED ORDER — TRIAMCINOLONE ACETONIDE 40 MG/ML IJ SUSP
40.0000 mg | Freq: Once | INTRAMUSCULAR | Status: AC
  Administered 2023-10-08: 40 mg via INTRAMUSCULAR

## 2023-10-08 MED ORDER — AZITHROMYCIN 250 MG PO TABS: ORAL_TABLET | ORAL | 0 refills | Status: AC

## 2023-10-08 MED ORDER — BENZONATATE 100 MG PO CAPS: 100.0000 mg | ORAL_CAPSULE | Freq: Three times a day (TID) | ORAL | 1 refills | Status: DC | PRN

## 2023-10-08 MED ORDER — AIRSUPRA 90-80 MCG/ACT IN AERO: 2.0000 | INHALATION_SPRAY | Freq: Four times a day (QID) | RESPIRATORY_TRACT | 3 refills | Status: AC | PRN

## 2023-10-08 NOTE — Progress Notes (Addendum)
I,Jameka J Llittleton, CMA,acting as a Neurosurgeon for [employer], NP.,have documented all relevant documentation on the behalf of Meagan Hose, NP,as directed by  Meagan Hose, NP while in the presence of Meagan Hose, NP.  Subjective:  Patient ID: Meagan [name] , female    DOB: 27-Apr-1961 , 62 y.o.   MRN: [id number]  Chief Complaint  Patient presents with   URI    HPI  Patient is a 62 year old female that presents today for cold symptoms that started about 48-72 hours ago. She stated she has been having difficulty breathing, productive cough, headaches and chest heaviness. Patient states she came in to the office to be evaluated because she wants to make sure that she does not have pneumonia.      Past Medical History:  Diagnosis Date   Arthritis    Chronic kidney disease    Complication of anesthesia    Diabetes mellitus (HCC)    GERD (gastroesophageal reflux disease)    Heart murmur    when 18 per patient   Hyperlipidemia    Hypertension    Neuropathy    Pneumonia    PONV (postoperative nausea and vomiting)    PVD (peripheral vascular disease) (HCC)      Family History  Problem Relation Age of Onset   Kidney failure Mother    Liver disease Mother    Early death Father    Hypertension Brother    Colon cancer Neg Hx    Stomach cancer Neg Hx    Colon polyps Neg Hx    Esophageal cancer Neg Hx    Ulcerative colitis Neg Hx      Current Outpatient Medications:    Albuterol-Budesonide (AIRSUPRA) 90-80 MCG/ACT AERO, Inhale 2 puffs into the lungs 4 (four) times daily as needed., Disp: 10.7 g, Rfl: 3   atorvastatin (LIPITOR) 80 MG tablet, Take 1 tablet (80 mg total) by mouth daily., Disp: 90 tablet, Rfl: 2   benzonatate (TESSALON PERLES) 100 MG capsule, Take 1 capsule (100 mg total) by mouth 3 (three) times daily as needed for cough., Disp: 30 capsule, Rfl: 1   Blood Glucose Monitoring Suppl (ONETOUCH VERIO FLEX SYSTEM) w/Device KIT, Use as directed to check blood sugars 2  times per day dx: e11.65, Disp: 1 kit, Rfl: 1   Cholecalciferol (DIALYVITE VITAMIN D 5000 PO), Take 10,000 Units by mouth at bedtime., Disp: , Rfl:    Continuous Blood Gluc Sensor (DEXCOM G7 SENSOR) MISC, USE FOR CONTINUOUS BLOOD GLUCOSE MONITORING,REPLACE EVERY 10 DAYS, Disp: , Rfl:    diclofenac Sodium (VOLTAREN) 1 % GEL, Apply 1 Application topically 3 (three) times daily as needed (joint pain). Use as directed, apply to affected joints tid prn, Disp: 100 g, Rfl: 1   empagliflozin (JARDIANCE) 25 MG TABS tablet, Take 1 tablet (25 mg total) by mouth daily before breakfast., Disp: 90 tablet, Rfl: 3   ferrous sulfate 325 (65 FE) MG tablet, TAKE 1 TABLET BY MOUTH EVERY DAY WITH BREAKFAST (Patient taking differently: Take 325 mg by mouth at bedtime.), Disp: 90 tablet, Rfl: 1   gabapentin (NEURONTIN) 300 MG capsule, TAKE 1 CAPSULE BY MOUTH 3 (THREE) TIMES DAILY. TAKE 2 CAPSULES TO EQUAL 600MG  AT NIGHT., Disp: 180 capsule, Rfl: 2   glucose blood (ONETOUCH VERIO) test strip, USE TO CHECK BLOOD SUGAR TWICE A DAY, Disp: 100 strip, Rfl: 12   insulin aspart (NOVOLOG FLEXPEN) 100 UNIT/ML FlexPen, Inject 12 Units into the skin 3 (three) times daily with meals., Disp: 30  mL, Rfl: 6   insulin degludec (TRESIBA FLEXTOUCH) 200 UNIT/ML FlexTouch Pen, Inject 64 Units into the skin every evening., Disp: 30 mL, Rfl: 3   Insulin Pen Needle 32G X 4 MM MISC, 1 Device by Does not apply route in the morning, at noon, in the evening, and at bedtime., Disp: 400 each, Rfl: 3   losartan (COZAAR) 25 MG tablet, Take 1 tablet (25 mg total) by mouth daily., Disp: 90 tablet, Rfl: 3   MAGNESIUM PO, Take 1 tablet by mouth daily., Disp: , Rfl:    Multiple Vitamin (MULTIVITAMIN WITH MINERALS) TABS tablet, Take 1 tablet by mouth daily., Disp: , Rfl:    mupirocin cream (BACTROBAN) 2 %, Apply 1 Application topically 2 (two) times daily., Disp: 15 g, Rfl: 0   nystatin-triamcinolone (MYCOLOG II) cream, APPLY TO THE AFFECTED AREAS 2 TIMES PER  DAY IN THE MORNING AND EVENING FOR 2 WEEKS AS NEEDED, Disp: , Rfl:    Omega-3 Fatty Acids (FISH OIL PO), Take 1 capsule by mouth in the morning., Disp: , Rfl:    omeprazole (PRILOSEC) 40 MG capsule, Take 1 capsule (40 mg total) by mouth daily., Disp: 90 capsule, Rfl: 2   sertraline (ZOLOFT) 50 MG tablet, TAKE 1 TABLET BY MOUTH EVERYDAY AT BEDTIME, Disp: 90 tablet, Rfl: 1   tirzepatide (MOUNJARO) 7.5 MG/0.5ML Pen, Inject 7.5 mg into the skin once a week., Disp: 6 mL, Rfl: 3   tirzepatide (MOUNJARO) 7.5 MG/0.5ML Pen, Inject 7.5 mg into the skin once a week., Disp: 6 mL, Rfl: 3   Turmeric 500 MG CAPS, Take 500 mg by mouth at bedtime., Disp: , Rfl:    valACYclovir (VALTREX) 500 MG tablet, TAKE 1 TABLET (500 MG TOTAL) BY MOUTH IN THE MORNING, Disp: 90 tablet, Rfl: 1   cephALEXin (KEFLEX) 500 MG capsule, Take 1 capsule (500 mg total) by mouth 4 (four) times daily. (Patient not taking: Reported on 03/04/2023), Disp: 20 capsule, Rfl: 0   Allergies  Allergen Reactions   Promethazine Hcl Shortness Of Breath and Other (See Comments)    Bells palsy   Lisinopril Itching and Swelling     Review of Systems  Constitutional: Negative.  Negative for chills and fever.  HENT:  Positive for congestion and postnasal drip.   Respiratory:  Positive for shortness of breath.   Endocrine: Negative.   Genitourinary: Negative.   Musculoskeletal: Negative.      Today's Vitals   10/08/23 1441  BP: 124/82  Pulse: 83  Temp: 98.1 F (36.7 C)  Weight: 199 lb (90.3 kg)  Height: 5\' 3"  (1.6 m)  PainSc: 0-No pain   Body mass index is 35.25 kg/m.  Wt Readings from Last 3 Encounters:  10/08/23 199 lb (90.3 kg)  09/04/23 195 lb (88.5 kg)  08/11/23 199 lb (90.3 kg)    The 10-year ASCVD risk score (Arnett DK, et al., 2019) is: 16.3%   Values used to calculate the score:     Age: 23 years     Sex: Female     Is Non-Hispanic African American: Yes     Diabetic: Yes     Tobacco smoker: No     Systolic Blood  Pressure: 124 mmHg     Is BP treated: Yes     HDL Cholesterol: 38 mg/dL     Total Cholesterol: 170 mg/dL  Objective:  Physical Exam Constitutional:      Appearance: Normal appearance.  HENT:     Nose: Congestion present.  Cardiovascular:  Rate and Rhythm: Normal rate and regular rhythm.     Pulses: Normal pulses.     Heart sounds: Normal heart sounds.  Pulmonary:     Effort: Pulmonary effort is normal.     Breath sounds: Normal breath sounds.  Abdominal:     General: Bowel sounds are normal.  Skin:    General: Skin is warm and dry.  Neurological:     Mental Status: She is alert.         Assessment And Plan:  Cough in adult -     DG Chest 2 View; Future -     Benzonatate; Take 1 capsule (100 mg total) by mouth 3 (three) times daily as needed for cough.  Dispense: 30 capsule; Refill: 1 -     Azithromycin; Take 2 tablets (500 mg) on  Day 1,  followed by 1 tablet (250 mg) once daily on Days 2 through 5.  Dispense: 6 each; Refill: 0  Bronchospasm -     Airsupra; Inhale 2 puffs into the lungs 4 (four) times daily as needed.  Dispense: 10.7 g; Refill: 3 -     Triamcinolone Acetonide    Return for Keep  Scheduled Appt..  Patient was given opportunity to ask questions. Patient verbalized understanding of the plan and was able to repeat key elements of the plan. All questions were answered to their satisfaction.    I, Meagan Hose, NP, have reviewed all documentation for this visit. The documentation on 10/21/2023 for the exam, diagnosis, procedures, and orders are all accurate and complete.     IF YOU HAVE BEEN REFERRED TO A SPECIALIST, IT MAY TAKE 1-2 WEEKS TO SCHEDULE/PROCESS THE REFERRAL. IF YOU HAVE NOT HEARD FROM US/SPECIALIST IN TWO WEEKS, PLEASE GIVE [place] A CALL AT [phone] X [phone].

## 2023-10-28 ENCOUNTER — Other Ambulatory Visit: Payer: Self-pay | Admitting: Internal Medicine

## 2023-10-30 ENCOUNTER — Other Ambulatory Visit: Payer: Self-pay

## 2023-11-02 ENCOUNTER — Other Ambulatory Visit: Payer: Self-pay

## 2023-11-02 MED ORDER — DEXCOM G7 SENSOR MISC
2 refills | Status: DC
  Filled 2023-11-02: qty 3, 30d supply, fill #0

## 2023-11-04 ENCOUNTER — Encounter: Payer: Self-pay | Admitting: Internal Medicine

## 2023-11-04 ENCOUNTER — Ambulatory Visit: Payer: BC Managed Care – PPO | Admitting: Internal Medicine

## 2023-11-04 VITALS — BP 110/78 | HR 98 | Temp 97.4°F | Ht 63.0 in | Wt 190.6 lb

## 2023-11-04 DIAGNOSIS — E66811 Obesity, class 1: Secondary | ICD-10-CM

## 2023-11-04 DIAGNOSIS — N182 Chronic kidney disease, stage 2 (mild): Secondary | ICD-10-CM

## 2023-11-04 DIAGNOSIS — Z23 Encounter for immunization: Secondary | ICD-10-CM | POA: Diagnosis not present

## 2023-11-04 DIAGNOSIS — Z794 Long term (current) use of insulin: Secondary | ICD-10-CM

## 2023-11-04 DIAGNOSIS — E7800 Pure hypercholesterolemia, unspecified: Secondary | ICD-10-CM

## 2023-11-04 DIAGNOSIS — E6609 Other obesity due to excess calories: Secondary | ICD-10-CM

## 2023-11-04 DIAGNOSIS — J309 Allergic rhinitis, unspecified: Secondary | ICD-10-CM

## 2023-11-04 DIAGNOSIS — I1310 Hypertensive heart and chronic kidney disease without heart failure, with stage 1 through stage 4 chronic kidney disease, or unspecified chronic kidney disease: Secondary | ICD-10-CM

## 2023-11-04 DIAGNOSIS — E1122 Type 2 diabetes mellitus with diabetic chronic kidney disease: Secondary | ICD-10-CM | POA: Diagnosis not present

## 2023-11-04 DIAGNOSIS — Z6833 Body mass index (BMI) 33.0-33.9, adult: Secondary | ICD-10-CM

## 2023-11-04 DIAGNOSIS — I739 Peripheral vascular disease, unspecified: Secondary | ICD-10-CM | POA: Diagnosis not present

## 2023-11-04 DIAGNOSIS — R0982 Postnasal drip: Secondary | ICD-10-CM

## 2023-11-04 NOTE — Progress Notes (Signed)
I,Victoria T Deloria Lair, CMA,acting as a Neurosurgeon for Gwynneth Aliment, MD.,have documented all relevant documentation on the behalf of Gwynneth Aliment, MD,as directed by  Gwynneth Aliment, MD while in the presence of Gwynneth Aliment, MD.  Subjective:  Patient ID: Meagan [name] , female    DOB: Apr 17, 1961 , 62 y.o.   MRN: [id number]  Chief Complaint  Patient presents with   Diabetes   Hypertension    HPI  Patient presents today for a DM/HTN check.  She reports compliance with medications. She states she is now exercising more frequently.  She has been more compliant with her meds as well.  Letter sent to gyn for mammogram report.    Diabetes She presents for her follow-up diabetic visit. She has type 2 diabetes mellitus. Her disease course has been improving. Pertinent negatives for hypoglycemia include no hunger. Pertinent negatives for diabetes include no blurred vision and no visual change. There are no hypoglycemic complications. Symptoms are improving. Diabetic complications include nephropathy. Risk factors for coronary artery disease include hypertension, obesity and diabetes mellitus. Current diabetic treatment includes insulin injections and oral agent (monotherapy). She is compliant with treatment most of the time. She is following a generally healthy diet. She has not had a previous visit with a dietitian. She participates in exercise intermittently. Her breakfast blood glucose is taken between 9-10 am. Her breakfast blood glucose range is generally 110-130 mg/dl. She does not see a podiatrist.Eye exam is current.  Hypertension This is a chronic problem. The current episode started more than 1 year ago. Pertinent negatives include no blurred vision. Risk factors for coronary artery disease include diabetes mellitus, dyslipidemia, obesity, post-menopausal state and sedentary lifestyle. Past treatments include ACE inhibitors. Compliance problems include exercise.      Past Medical  History:  Diagnosis Date   Arthritis    Chronic kidney disease    Complication of anesthesia    Diabetes mellitus (HCC)    GERD (gastroesophageal reflux disease)    Heart murmur    when 18 per patient   Hyperlipidemia    Hypertension    Neuropathy    Pneumonia    PONV (postoperative nausea and vomiting)    PVD (peripheral vascular disease) (HCC)      Family History  Problem Relation Age of Onset   Kidney failure Mother    Liver disease Mother    Early death Father    Hypertension Brother    Colon cancer Neg Hx    Stomach cancer Neg Hx    Colon polyps Neg Hx    Esophageal cancer Neg Hx    Ulcerative colitis Neg Hx      Current Outpatient Medications:    Albuterol-Budesonide (AIRSUPRA) 90-80 MCG/ACT AERO, Inhale 2 puffs into the lungs 4 (four) times daily as needed., Disp: 10.7 g, Rfl: 3   atorvastatin (LIPITOR) 80 MG tablet, Take 1 tablet (80 mg total) by mouth daily., Disp: 90 tablet, Rfl: 2   Blood Glucose Monitoring Suppl (ONETOUCH VERIO FLEX SYSTEM) w/Device KIT, Use as directed to check blood sugars 2 times per day dx: e11.65, Disp: 1 kit, Rfl: 1   Cholecalciferol (DIALYVITE VITAMIN D 5000 PO), Take 10,000 Units by mouth at bedtime., Disp: , Rfl:    Continuous Glucose Sensor (DEXCOM G7 SENSOR) MISC, USE FOR CONTINUOUS BLOOD GLUCOSE MONITORING, REPLACE EVERY 10 DAYS., Disp: 3 each, Rfl: 2   diclofenac Sodium (VOLTAREN) 1 % GEL, Apply 1 Application topically 3 (three) times daily as needed (joint pain).  Use as directed, apply to affected joints tid prn, Disp: 100 g, Rfl: 1   empagliflozin (JARDIANCE) 25 MG TABS tablet, Take 1 tablet (25 mg total) by mouth daily before breakfast., Disp: 90 tablet, Rfl: 3   ferrous sulfate 325 (65 FE) MG tablet, TAKE 1 TABLET BY MOUTH EVERY DAY WITH BREAKFAST (Patient taking differently: Take 325 mg by mouth at bedtime.), Disp: 90 tablet, Rfl: 1   gabapentin (NEURONTIN) 300 MG capsule, TAKE 1 CAPSULE BY MOUTH 3 (THREE) TIMES DAILY. TAKE 2  CAPSULES TO EQUAL 600MG  AT NIGHT., Disp: 180 capsule, Rfl: 2   glucose blood (ONETOUCH VERIO) test strip, USE TO CHECK BLOOD SUGAR TWICE A DAY, Disp: 100 strip, Rfl: 12   insulin aspart (NOVOLOG FLEXPEN) 100 UNIT/ML FlexPen, Inject 12 Units into the skin 3 (three) times daily with meals., Disp: 30 mL, Rfl: 6   insulin degludec (TRESIBA FLEXTOUCH) 200 UNIT/ML FlexTouch Pen, Inject 64 Units into the skin every evening., Disp: 30 mL, Rfl: 3   Insulin Pen Needle 32G X 4 MM MISC, 1 Device by Does not apply route in the morning, at noon, in the evening, and at bedtime., Disp: 400 each, Rfl: 3   losartan (COZAAR) 25 MG tablet, Take 1 tablet (25 mg total) by mouth daily., Disp: 90 tablet, Rfl: 3   MAGNESIUM PO, Take 1 tablet by mouth daily., Disp: , Rfl:    Multiple Vitamin (MULTIVITAMIN WITH MINERALS) TABS tablet, Take 1 tablet by mouth daily., Disp: , Rfl:    mupirocin cream (BACTROBAN) 2 %, Apply 1 Application topically 2 (two) times daily., Disp: 15 g, Rfl: 0   nystatin-triamcinolone (MYCOLOG II) cream, APPLY TO THE AFFECTED AREAS 2 TIMES PER DAY IN THE MORNING AND EVENING FOR 2 WEEKS AS NEEDED, Disp: , Rfl:    Omega-3 Fatty Acids (FISH OIL PO), Take 1 capsule by mouth in the morning., Disp: , Rfl:    omeprazole (PRILOSEC) 40 MG capsule, Take 1 capsule (40 mg total) by mouth daily., Disp: 90 capsule, Rfl: 2   sertraline (ZOLOFT) 50 MG tablet, TAKE 1 TABLET BY MOUTH EVERYDAY AT BEDTIME, Disp: 90 tablet, Rfl: 1   tirzepatide (MOUNJARO) 7.5 MG/0.5ML Pen, Inject 7.5 mg into the skin once a week., Disp: 6 mL, Rfl: 3   Turmeric 500 MG CAPS, Take 500 mg by mouth at bedtime., Disp: , Rfl:    valACYclovir (VALTREX) 500 MG tablet, TAKE 1 TABLET (500 MG TOTAL) BY MOUTH IN THE MORNING, Disp: 90 tablet, Rfl: 1   Allergies  Allergen Reactions   Promethazine Hcl Shortness Of Breath and Other (See Comments)    Bells palsy   Lisinopril Itching and Swelling     Review of Systems  Constitutional: Negative.    HENT:  Positive for postnasal drip.   Eyes:  Negative for blurred vision.  Respiratory: Negative.    Cardiovascular: Negative.   Gastrointestinal: Negative.   Skin: Negative.   Neurological: Negative.   Psychiatric/Behavioral: Negative.       Today's Vitals   11/04/23 1158  BP: 110/78  Pulse: 98  Temp: (!) 97.4 F (36.3 C)  SpO2: 98%  Weight: 190 lb 9.6 oz (86.5 kg)  Height: 5\' 3"  (1.6 m)   Body mass index is 33.76 kg/m.  Wt Readings from Last 3 Encounters:  11/04/23 190 lb 9.6 oz (86.5 kg)  10/08/23 199 lb (90.3 kg)  09/04/23 195 lb (88.5 kg)     Objective:  Physical Exam Vitals and nursing note reviewed.  Constitutional:  Appearance: Normal appearance.  HENT:     Head: Normocephalic and atraumatic.  Eyes:     Extraocular Movements: Extraocular movements intact.  Cardiovascular:     Rate and Rhythm: Normal rate and regular rhythm.     Heart sounds: Normal heart sounds.  Pulmonary:     Effort: Pulmonary effort is normal.     Breath sounds: Normal breath sounds.  Musculoskeletal:     Cervical back: Normal range of motion.  Skin:    General: Skin is warm.  Neurological:     General: No focal deficit present.     Mental Status: She is alert.  Psychiatric:        Mood and Affect: Mood normal.        Behavior: Behavior normal.         Assessment And Plan:  Type 2 diabetes mellitus with stage 2 chronic kidney disease, with long-term current use of insulin (HCC) Assessment & Plan: Chronic, I will check labs as below. She was congratulated on her lifestyle changes. She will continue with Jardiance 25mg  daily, Mounjaro 7.5mg  weekly and Tresiba 64 units qpm.   Orders: -     CMP14+EGFR -     Lipid panel -     Hemoglobin A1c  Hypertensive heart and renal disease with renal failure, stage 1 through stage 4 or unspecified chronic kidney disease, without heart failure -     CMP14+EGFR -     Lipid panel  PVD (peripheral vascular disease) (HCC) Assessment  & Plan: Chronic, LDL goal is less than 70 (less than 55 would be ideal).  She will continue with atorvastatin 80mg  daily. Importance of regular exercise was discussed with the patient.    Pure hypercholesterolemia Assessment & Plan: Chronic, LDL goal is less than 70.  She will continue with atorvastatin 80mg  daily. Importance of medication/dietary compliance was stressed to the patient.   Orders: -     CMP14+EGFR -     Lipid panel  Allergic rhinitis with postnasal drip Assessment & Plan: She is advised to take Zyrtec nightly. She will let me know if her sx persist.    Class 1 obesity due to excess calories with serious comorbidity and body mass index (BMI) of 33.0 to 33.9 in adult Assessment & Plan: She is encouraged to strive for BMI less than 30 to decrease cardiac risk. Advised to aim for at least 150 minutes of exercise per week.    Immunization due -     Flu vaccine trivalent PF, 6mos and older(Flulaval,Afluria,Fluarix,Fluzone) -     Pfizer Comirnaty Covid-19 Vaccine 63yrs & older  She is encouraged to strive for BMI less than 30 to decrease cardiac risk. Advised to aim for at least 150 minutes of exercise per week.    Return if symptoms worsen or fail to improve.  Patient was given opportunity to ask questions. Patient verbalized understanding of the plan and was able to repeat key elements of the plan. All questions were answered to their satisfaction.    I, Gwynneth Aliment, MD, have reviewed all documentation for this visit. The documentation on 11/04/23 for the exam, diagnosis, procedures, and orders are all accurate and complete.   IF YOU HAVE BEEN REFERRED TO A SPECIALIST, IT MAY TAKE 1-2 WEEKS TO SCHEDULE/PROCESS THE REFERRAL. IF YOU HAVE NOT HEARD FROM US/SPECIALIST IN TWO WEEKS, PLEASE GIVE [place] A CALL AT [phone].   THE PATIENT IS ENCOURAGED TO PRACTICE SOCIAL DISTANCING DUE TO THE COVID-19 PANDEMIC.

## 2023-11-04 NOTE — Patient Instructions (Signed)

## 2023-11-05 ENCOUNTER — Other Ambulatory Visit: Payer: Self-pay

## 2023-11-05 LAB — LIPID PANEL
Chol/HDL Ratio: 4.1 {ratio} (ref 0.0–4.4)
Cholesterol, Total: 194 mg/dL (ref 100–199)
HDL: 47 mg/dL (ref >39)
LDL Chol Calc (NIH): 135 mg/dL — ABNORMAL HIGH (ref 0–99)
Triglycerides: 66 mg/dL (ref 0–149)
VLDL Cholesterol Cal: 12 mg/dL (ref 5–40)

## 2023-11-05 LAB — CMP14+EGFR
ALT: 18 [IU]/L (ref 0–32)
AST: 16 [IU]/L (ref 0–40)
Albumin: 3.7 g/dL — ABNORMAL LOW (ref 3.9–4.9)
Alkaline Phosphatase: 122 [IU]/L — ABNORMAL HIGH (ref 44–121)
BUN/Creatinine Ratio: 22 (ref 12–28)
BUN: 24 mg/dL (ref 8–27)
Bilirubin Total: 0.3 mg/dL (ref 0.0–1.2)
CO2: 19 mmol/L — ABNORMAL LOW (ref 20–29)
Calcium: 8.7 mg/dL (ref 8.7–10.3)
Chloride: 109 mmol/L — ABNORMAL HIGH (ref 96–106)
Creatinine, Ser: 1.11 mg/dL — ABNORMAL HIGH (ref 0.57–1.00)
Globulin, Total: 3.3 g/dL (ref 1.5–4.5)
Glucose: 74 mg/dL (ref 70–99)
Potassium: 4.4 mmol/L (ref 3.5–5.2)
Sodium: 141 mmol/L (ref 134–144)
Total Protein: 7 g/dL (ref 6.0–8.5)
eGFR: 56 mL/min/{1.73_m2} — ABNORMAL LOW (ref >59)

## 2023-11-05 LAB — HEMOGLOBIN A1C
Est. average glucose Bld gHb Est-mCnc: 151 mg/dL
Hgb A1c MFr Bld: 6.9 % — ABNORMAL HIGH (ref 4.8–5.6)

## 2023-11-10 ENCOUNTER — Other Ambulatory Visit: Payer: Self-pay

## 2023-11-13 ENCOUNTER — Other Ambulatory Visit: Payer: Self-pay | Admitting: Internal Medicine

## 2023-11-15 NOTE — Assessment & Plan Note (Signed)
Chronic, LDL goal is less than 70.  She will continue with atorvastatin 80mg  daily. Importance of medication/dietary compliance was stressed to the patient.

## 2023-11-15 NOTE — Assessment & Plan Note (Signed)
Chronic, LDL goal is less than 70 (less than 55 would be ideal).  She will continue with atorvastatin 80mg  daily. Importance of regular exercise was discussed with the patient.

## 2023-11-15 NOTE — Assessment & Plan Note (Signed)
She is advised to take Zyrtec nightly. She will let me know if her sx persist.

## 2023-11-15 NOTE — Assessment & Plan Note (Signed)
Chronic, I will check labs as below. She was congratulated on her lifestyle changes. She will continue with Jardiance 25mg  daily, Mounjaro 7.5mg  weekly and Tresiba 64 units qpm.

## 2023-11-15 NOTE — Assessment & Plan Note (Signed)
 She is encouraged to strive for BMI less than 30 to decrease cardiac risk. Advised to aim for at least 150 minutes of exercise per week.

## 2023-11-16 ENCOUNTER — Other Ambulatory Visit: Payer: Self-pay

## 2023-11-27 LAB — HM PAP SMEAR: HPV, high-risk: NEGATIVE

## 2023-11-27 LAB — RESULTS CONSOLE HPV: CHL HPV: NEGATIVE

## 2023-12-14 ENCOUNTER — Other Ambulatory Visit: Payer: Self-pay

## 2023-12-14 MED ORDER — EZETIMIBE 10 MG PO TABS: 10.0000 mg | ORAL_TABLET | Freq: Every day | ORAL | 1 refills | Status: DC

## 2024-01-13 ENCOUNTER — Other Ambulatory Visit: Payer: Self-pay | Admitting: Internal Medicine

## 2024-01-16 ENCOUNTER — Other Ambulatory Visit: Payer: Self-pay | Admitting: Internal Medicine

## 2024-01-16 DIAGNOSIS — F419 Anxiety disorder, unspecified: Secondary | ICD-10-CM

## 2024-01-16 DIAGNOSIS — E7800 Pure hypercholesterolemia, unspecified: Secondary | ICD-10-CM

## 2024-01-25 ENCOUNTER — Other Ambulatory Visit: Payer: Self-pay | Admitting: Internal Medicine

## 2024-01-25 DIAGNOSIS — M79642 Pain in left hand: Secondary | ICD-10-CM

## 2024-01-26 ENCOUNTER — Other Ambulatory Visit: Payer: Self-pay

## 2024-01-26 DIAGNOSIS — M79642 Pain in left hand: Secondary | ICD-10-CM

## 2024-01-26 MED ORDER — GABAPENTIN 300 MG PO CAPS: ORAL_CAPSULE | ORAL | 2 refills | Status: DC

## 2024-01-26 MED ORDER — DICLOFENAC SODIUM 1 % EX GEL: 1.0000 | Freq: Three times a day (TID) | CUTANEOUS | 1 refills | Status: AC | PRN

## 2024-02-01 ENCOUNTER — Other Ambulatory Visit: Payer: Self-pay | Admitting: Internal Medicine

## 2024-02-18 ENCOUNTER — Other Ambulatory Visit: Payer: Self-pay | Admitting: Internal Medicine

## 2024-02-29 ENCOUNTER — Other Ambulatory Visit: Payer: Self-pay | Admitting: Internal Medicine

## 2024-02-29 NOTE — Telephone Encounter (Signed)
 Copied from CRM [id number]. Topic: Clinical - Medication Refill >> Feb 29, 2024  2:17 PM Fuller Mandril wrote: Most Recent Primary Care Visit:  Provider: Dorothyann Peng  Department: [facility]  Visit Type: OFFICE VISIT  Date: 11/04/2023  Medication:  insulin degludec (TRESIBA FLEXTOUCH) 200 UNIT/ML FlexTouch Pen  Has the patient contacted their pharmacy? Yes (Agent: If no, request that the patient contact the pharmacy for the refill. If patient does not wish to contact the pharmacy document the reason why and proceed with request.) (Agent: If yes, when and what did the pharmacy advise?) requested and has not received   Is this the correct pharmacy for this prescription? Yes If no, delete pharmacy and type the correct one.  This is the patient's preferred pharmacy:  [id number] - [place],  - [address] AT [facility] [address] [place] [address] Phone: [phone] Fax: [phone]   Has the prescription been filled recently? No  Is the patient out of the medication? Yes  Has the patient been seen for an appointment in the last year OR does the patient have an upcoming appointment? Yes  Can we respond through MyChart? Yes  Agent: Please be advised that Rx refills may take up to 3 business days. We ask that you follow-up with your pharmacy.

## 2024-03-01 MED ORDER — TRESIBA FLEXTOUCH 200 UNIT/ML ~~LOC~~ SOPN: 64.0000 [IU] | PEN_INJECTOR | Freq: Every evening | SUBCUTANEOUS | 3 refills | Status: DC

## 2024-03-04 ENCOUNTER — Encounter: Payer: Self-pay | Admitting: Internal Medicine

## 2024-03-04 ENCOUNTER — Ambulatory Visit: Payer: BC Managed Care – PPO | Admitting: Internal Medicine

## 2024-03-04 VITALS — BP 104/68 | HR 84 | Ht 63.0 in | Wt 172.0 lb

## 2024-03-04 DIAGNOSIS — N1832 Chronic kidney disease, stage 3b: Secondary | ICD-10-CM | POA: Diagnosis not present

## 2024-03-04 DIAGNOSIS — L239 Allergic contact dermatitis, unspecified cause: Secondary | ICD-10-CM | POA: Insufficient documentation

## 2024-03-04 DIAGNOSIS — E1122 Type 2 diabetes mellitus with diabetic chronic kidney disease: Secondary | ICD-10-CM

## 2024-03-04 DIAGNOSIS — Z794 Long term (current) use of insulin: Secondary | ICD-10-CM | POA: Diagnosis not present

## 2024-03-04 LAB — POCT GLYCOSYLATED HEMOGLOBIN (HGB A1C): Hemoglobin A1C: 5.7 % — AB (ref 4.0–5.6)

## 2024-03-04 MED ORDER — INSULIN PEN NEEDLE 32G X 4 MM MISC: 1.0000 | Freq: Four times a day (QID) | 3 refills | Status: DC

## 2024-03-04 MED ORDER — NOVOLOG FLEXPEN 100 UNIT/ML ~~LOC~~ SOPN: 10.0000 [IU] | PEN_INJECTOR | Freq: Three times a day (TID) | SUBCUTANEOUS | 6 refills | Status: DC

## 2024-03-04 MED ORDER — TRIAMCINOLONE ACETONIDE 0.1 % EX CREA: 1.0000 | TOPICAL_CREAM | Freq: Two times a day (BID) | CUTANEOUS | 0 refills | Status: AC

## 2024-03-04 MED ORDER — LOSARTAN POTASSIUM 25 MG PO TABS: 25.0000 mg | ORAL_TABLET | Freq: Every day | ORAL | 3 refills | Status: DC

## 2024-03-04 MED ORDER — TRESIBA FLEXTOUCH 200 UNIT/ML ~~LOC~~ SOPN: 30.0000 [IU] | PEN_INJECTOR | Freq: Every evening | SUBCUTANEOUS | Status: DC

## 2024-03-04 NOTE — Progress Notes (Signed)
 Name: Meagan [name]  MRN/ DOB: [id number], 05-Oct-1961   Age/ Sex: 63 y.o., female    PCP: Dorothyann Peng, MD   Reason for Endocrinology Evaluation: Type 2 Diabetes Mellitus     Date of Initial Endocrinology Visit: 03/04/2023    PATIENT IDENTIFIER: [name] is a 63 y.o. female with a past medical history of HTN, PVD, DM. The patient presented for initial endocrinology clinic visit on 03/04/2024 for consultative assistance with her diabetes management.    HPI: [name] was    Diagnosed with DM 2005 Prior Medications tried/Intolerance: Metformin -no intolerance , Janumet - no intolerance  , victoza - not effective . Ozempic - nausea                Hemoglobin A1c has ranged from 6.5% in 2019, peaking at 11.2% in 2023.  On her initial visit to our clinic she had an A1c 8.2% , she was already on Jardiance, Mounjaro, basal/prandial insulin   SUBJECTIVE:   During the last visit (09/04/2023): A1c 6.8%     Today (03/04/24): Meagan [name] is here for a follow up on diabetes management. She checks her blood sugars multiple  times daily. The patient has  had hypoglycemic episodes since the last clinic visit..   Denies constipation or diarrhea No nausea or vomiting  Has not had tresiba in ~3-4 weeks Has noted a rash for the past week, around the neck    HOME DIABETES REGIMEN: Jardiance 25 mg daily Mounjaro 7.5 mg weekly Tresiba 64 units daily  NovoLog 12 units with each meal  Losartan 25 mg daily      Statin: yes ACE-I/ARB: no   CONTINUOUS GLUCOSE MONITORING RECORD INTERPRETATION    Dates of Recording: 3/22-03/04/2024  Sensor description: dexcom  Results statistics:   CGM use % of time 95  Average and SD 142/33  Time in range   87     %  % Time Above 180 11  % Time above 250 1  % Time Below target 1   Glycemic patterns summary: Bg's ar optimal through the day and night   Hyperglycemic episodes  postprandial e  Hypoglycemic episodes occurred  after a bolus  Overnight periods: optimal     DIABETIC COMPLICATIONS: Microvascular complications:  CKD, neuropathy Denies: retinopathy Last eye exam: Completed 2023  Macrovascular complications:  PVD Denies: CAD, CVA   PAST HISTORY: Past Medical History:  Past Medical History:  Diagnosis Date   Arthritis    Chronic kidney disease    Complication of anesthesia    Diabetes mellitus (HCC)    GERD (gastroesophageal reflux disease)    Heart murmur    when 18 per patient   Hyperlipidemia    Hypertension    Neuropathy    Pneumonia    PONV (postoperative nausea and vomiting)    PVD (peripheral vascular disease) (HCC)    Past Surgical History:  Past Surgical History:  Procedure Laterality Date   AORTIC ARCH ANGIOGRAPHY N/A 08/01/2021   Procedure: AORTIC ARCH ANGIOGRAPHY;  Surgeon: Cephus Shelling, MD;  Location: [facility];  Service: Cardiovascular;  Laterality: N/A;   AORTIC ARCH ANGIOGRAPHY N/A 08/07/2022   Procedure: AORTIC ARCH ANGIOGRAPHY;  Surgeon: Cephus Shelling, MD;  Location: [facility];  Service: Cardiovascular;  Laterality: N/A;   CAROTID-SUBCLAVIAN BYPASS GRAFT Left 08/12/2021   Procedure: LEFT CAROTID-BRACHIAL ARTERY BYPASS using Left greater saphenous Vein, harvested from left leg.;  Surgeon: Cephus Shelling, MD;  Location: [facility] OR;  Service: Vascular;  Laterality: Left;   CAROTID-SUBCLAVIAN BYPASS GRAFT Left 08/13/2022   Procedure: REDO LEFT COMMON CAROTID-BRACHIAL ARTERY BYPASS USING PROPATEN GRAFT.;  Surgeon: Cephus Shelling, MD;  Location: [facility];  Service: Vascular;  Laterality: Left;   CARPAL TUNNEL RELEASE Left 01/31/2021   CARPAL TUNNEL RELEASE Right 2021   Dr. Dion Saucier   COLONOSCOPY WITH PROPOFOL N/A 12/04/2022   Procedure: COLONOSCOPY WITH PROPOFOL;  Surgeon: Meryl Dare, MD;  Location: [facility];  Service: Gastroenterology;  Laterality: N/A;   DILATION AND CURETTAGE OF UTERUS  2007   ENDARTERECTOMY Left 08/13/2022    Procedure: ENDARTERECTOMY CAROTID WITH BOVINE PATCH.;  Surgeon: Cephus Shelling, MD;  Location[facility];  Service: Vascular;  Laterality: Left;   INCISION AND DRAINAGE Left 09/17/2022   Procedure: INCISION AND DRAINAGE LEFT ARM;  Surgeon: Cephus Shelling, MD;  Location: [facility] OR;  Service: Vascular;  Laterality: Left;   POLYPECTOMY  12/04/2022   Procedure: POLYPECTOMY;  Surgeon: Meryl Dare, MD;  Location: [facility];  Service: Gastroenterology;;   ROTATOR CUFF REPAIR Right 2018   Dr. Dion Saucier   TUBAL LIGATION  1992   UPPER EXTREMITY ANGIOGRAPHY Left 08/01/2021   Procedure: UPPER EXTREMITY ANGIOGRAPHY;  Surgeon: Cephus Shelling, MD;  Location: [facility];  Service: Cardiovascular;  Laterality: Left;   UPPER EXTREMITY ANGIOGRAPHY Left 08/07/2022   Procedure: Upper Extremity Angiography;  Surgeon: Cephus Shelling, MD;  Location: [facility] INVASIVE CV LAB;  Service: Cardiovascular;  Laterality: Left;    Social History:  reports that she has never smoked. She has never used smokeless tobacco. She reports current alcohol use. She reports that she does not use drugs. Family History:  Family History  Problem Relation Age of Onset   Kidney failure Mother    Liver disease Mother    Early death Father    Hypertension Brother    Colon cancer Neg Hx    Stomach cancer Neg Hx    Colon polyps Neg Hx    Esophageal cancer Neg Hx    Ulcerative colitis Neg Hx      HOME MEDICATIONS: Allergies as of 03/04/2024       Reactions   Promethazine Hcl Shortness Of Breath, Other (See Comments)   Bells palsy   Lisinopril Itching, Swelling        Medication List        Accurate as of March 04, 2024  3:23 PM. If you have any questions, ask your nurse or doctor.          Airsupra 90-80 MCG/ACT Aero Generic drug: Albuterol-Budesonide Inhale 2 puffs into the lungs 4 (four) times daily as needed.   atorvastatin 80 MG tablet Commonly known as: LIPITOR TAKE 1 TABLET BY MOUTH EVERY DAY    [employer] Pain Relief 500 MG tablet Generic drug: acetaminophen Take 1,000 mg by mouth 3 (three) times daily.   Dexcom G7 Sensor Misc USE FOR CONTINUOUS BLOOD GLUCOSE MONITORING,REPLACE EVERY 10 DAYS   DIALYVITE VITAMIN D 5000 PO Take 10,000 Units by mouth at bedtime.   diclofenac Sodium 1 % Gel Commonly known as: VOLTAREN Apply 1 Application topically 3 (three) times daily as needed (joint pain). Use as directed, apply to affected joints tid prn   empagliflozin 25 MG Tabs tablet Commonly known as: Jardiance Take 1 tablet (25 mg total) by mouth daily before breakfast.   ezetimibe 10 MG tablet Commonly known as: ZETIA Take 1 tablet (10 mg total) by mouth daily.   ferrous  sulfate 325 (65 FE) MG tablet TAKE 1 TABLET BY MOUTH EVERY DAY WITH BREAKFAST What changed: See the new instructions.   FISH OIL PO Take 1 capsule by mouth in the morning.   gabapentin 300 MG capsule Commonly known as: NEURONTIN TAKE 1 CAPSULE BY MOUTH 3 (THREE) TIMES DAILY. TAKE 2 CAPSULES TO EQUAL 600MG  AT NIGHT.   Insulin Pen Needle 32G X 4 MM Misc 1 Device by Does not apply route in the morning, at noon, in the evening, and at bedtime.   losartan 25 MG tablet Commonly known as: COZAAR Take 1 tablet (25 mg total) by mouth daily.   MAGNESIUM PO Take 1 tablet by mouth daily.   Mounjaro 7.5 MG/0.5ML Pen Generic drug: tirzepatide Inject 7.5 mg into the skin once a week.   multivitamin with minerals Tabs tablet Take 1 tablet by mouth daily.   mupirocin cream 2 % Commonly known as: BACTROBAN Apply 1 Application topically 2 (two) times daily.   NovoLOG FlexPen 100 UNIT/ML FlexPen Generic drug: insulin aspart Inject 12 Units into the skin 3 (three) times daily with meals.   nystatin-triamcinolone cream Commonly known as: MYCOLOG II APPLY TO THE AFFECTED AREAS 2 TIMES PER DAY IN THE MORNING AND EVENING FOR 2 WEEKS AS NEEDED   omeprazole 40 MG capsule Commonly known as: PRILOSEC TAKE 1 CAPSULE (40  MG TOTAL) BY MOUTH DAILY.   OneTouch Verio Flex System w/Device Kit Use as directed to check blood sugars 2 times per day dx: e11.65   OneTouch Verio test strip Generic drug: glucose blood USE TO CHECK BLOOD SUGAR TWICE A DAY   sertraline 50 MG tablet Commonly known as: ZOLOFT TAKE 1 TABLET BY MOUTH EVERYDAY AT BEDTIME   Tresiba FlexTouch 200 UNIT/ML FlexTouch Pen Generic drug: insulin degludec Inject 64 Units into the skin every evening.   Turmeric 500 MG Caps Take 500 mg by mouth at bedtime.   valACYclovir 500 MG tablet Commonly known as: VALTREX TAKE 1 TABLET (500 MG TOTAL) BY MOUTH IN THE MORNING         ALLERGIES: Allergies  Allergen Reactions   Promethazine Hcl Shortness Of Breath and Other (See Comments)    Bells palsy   Lisinopril Itching and Swelling     REVIEW OF SYSTEMS: A comprehensive ROS was conducted with the patient and is negative except as per HPI    OBJECTIVE:   VITAL SIGNS: BP 104/68 (BP Location: Right Arm, Patient Position: Sitting, Cuff Size: Normal)   Pulse 84   Ht 5\' 3"  (1.6 m)   Wt 172 lb (78 kg)   SpO2 98%   BMI 30.47 kg/m    Filed Weights   03/04/24 1511  Weight: 172 lb (78 kg)    PHYSICAL EXAM:  General: Pt appears well and is in NAD Vesicular rash around the neck area   Lungs: Clear with good BS bilat   Heart: RRR   Extremities:  Lower extremities - No pretibial edema.   Neuro: MS is good with appropriate affect, pt is alert and Ox3    DM foot exam: 03/04/2024  The skin of the feet is intact without sores or ulcerations. The pedal pulses are undetectable  The sensation is intact to a screening 5.07, 10 gram monofilament bilaterally   DATA REVIEWED:  Lab Results  Component Value Date   HGBA1C 5.7 (A) 03/04/2024   HGBA1C 6.9 (H) 11/04/2023   HGBA1C 6.8 (H) 08/27/2023    Latest Reference Range & Units 11/04/23 12:36  Sodium 134 - 144 mmol/L 141  Potassium 3.5 - 5.2 mmol/L 4.4  Chloride 96 - 106 mmol/L 109  (H)  CO2 20 - 29 mmol/L 19 (L)  Glucose 70 - 99 mg/dL 74  BUN 8 - 27 mg/dL 24  Creatinine 8.65 - 7.84 mg/dL 6.96 (H)  Calcium 8.7 - 10.3 mg/dL 8.7  BUN/Creatinine Ratio 12 - 28  22  eGFR >59 mL/min/1.73 56 (L)  Alkaline Phosphatase 44 - 121 IU/L 122 (H)  Albumin 3.9 - 4.9 g/dL 3.7 (L)  AST 0 - 40 IU/L 16  ALT 0 - 32 IU/L 18  Total Protein 6.0 - 8.5 g/dL 7.0  Total Bilirubin 0.0 - 1.2 mg/dL 0.3    Latest Reference Range & Units 11/04/23 12:36  Total CHOL/HDL Ratio 0.0 - 4.4 ratio 4.1  Cholesterol, Total 100 - 199 mg/dL 295  HDL Cholesterol >28 mg/dL 47  Triglycerides 0 - 413 mg/dL 66  VLDL Cholesterol Cal 5 - 40 mg/dL 12  LDL Chol Calc (NIH) 0 - 99 mg/dL 244 (H)      Old records , labs and images have been reviewed.    ASSESSMENT / PLAN / RECOMMENDATIONS:   1) Type 2 Diabetes Mellitus, Optimally controlled, With CKD III, and neuropathic complications - Most recent A1c of 5.7 %. Goal A1c < 7.0 %.     -A1c is lower than it has been in the past, she has been without [place] for approximately 3-4 weeks.  Since her fasting BG's are optimal on Dexcom download, we have opted to remain off Tresiba at this time, especially with a low A1c. -Patient advised to contact our office if her fasting BG's remain consistently >150 mg/dL and I would consider restarting Tresiba 30 units a day -Patient was advised to decrease NovoLog as she has been noted with post bolus hypoglycemia, she will be provided with a correction scale to be used with each meal if needed  MEDICATIONS: Continue Jardiance 25 mg daily Continue Mounjaro 7.5 mg weekly Hold Tresiba for now Decrease NovoLog 10 units 3 times daily before every meal Start CF: NovoLog (BG-130/30) TIDQAC  EDUCATION / INSTRUCTIONS: BG monitoring instructions: Patient is instructed to check her blood sugars 3 times a day, before each meal. Call [facility] if: BG persistently < 70  I reviewed the Rule of 15 for the treatment of  hypoglycemia in detail with the patient. Literature supplied.   2) Diabetic complications:  Eye: Does not have known diabetic retinopathy.  Neuro/ Feet: Does  have known diabetic peripheral neuropathy. Renal: Patient does  have known baseline CKD. She is not on an ACEI/ARB at present.    3) CKD III:  -She has intolerance to lisinopril with pruritus and swelling -Tolerating losartan  Medication  Continue losartan 25 mg daily   4) Contact dermatitis:  -I have advised the patient to avoid jewelry of the neck area at this time -A prescription for triamcinolone cream has been sent to the pharmacy  I spent 25 minutes preparing to see the patient by review of recent labs, imaging and procedures, obtaining and reviewing separately obtained history, communicating with the patient, ordering medications, tests or procedures, and documenting clinical information in the EHR including the differential Dx, treatment, and any further evaluation and other management     Signed electronically by: Lyndle Herrlich, MD  [facility]  [facility] [address] [place] [place], [place] [address] Phone: [phone] FAX: [phone]   CC: Dorothyann Peng,  MD [address] [place] [address] Phone: [phone]  Fax: [phone]    Return to Endocrinology clinic as below: Future Appointments  Date Time Provider Department Center  03/28/2024  4:00 PM Dorothyann Peng, MD [facility] None  05/10/2024 11:00 AM Dorothyann Peng, MD [facility] None

## 2024-03-04 NOTE — Patient Instructions (Signed)
 Continue Jardiance 25 mg, 1 tablet every morning  Continue  Mounjaro 7.5 mg once weekly  Decrease NovoLog 10 units with each meal  Novolog correctional insulin: ADD extra units on insulin to your meal-time Novolog dose if your blood sugars are higher than 160. Use the scale below to help guide you:   Blood sugar before meal Number of units to inject  Less than 160 0 unit  161 -  190 1 units  191 -  220 2 units  221 -  250 3 units  251 -  280 4 units  281 -  310 5 units      HOW TO TREAT LOW BLOOD SUGARS (Blood sugar LESS THAN 70 MG/DL) Please follow the RULE OF 15 for the treatment of hypoglycemia treatment (when your (blood sugars are less than 70 mg/dL)   STEP 1: Take 15 grams of carbohydrates when your blood sugar is low, which includes:  3-4 GLUCOSE TABS  OR 3-4 OZ OF JUICE OR REGULAR SODA OR ONE TUBE OF GLUCOSE GEL    STEP 2: RECHECK blood sugar in 15 MINUTES STEP 3: If your blood sugar is still low at the 15 minute recheck --> then, go back to STEP 1 and treat AGAIN with another 15 grams of carbohydrates.

## 2024-03-07 ENCOUNTER — Encounter: Payer: Self-pay | Admitting: Internal Medicine

## 2024-03-28 ENCOUNTER — Encounter: Payer: Self-pay | Admitting: Internal Medicine

## 2024-03-28 ENCOUNTER — Ambulatory Visit: Payer: Self-pay | Admitting: Internal Medicine

## 2024-03-28 VITALS — BP 110/70 | HR 78 | Temp 98.1°F | Ht 63.0 in | Wt 174.2 lb

## 2024-03-28 DIAGNOSIS — N182 Chronic kidney disease, stage 2 (mild): Secondary | ICD-10-CM | POA: Diagnosis not present

## 2024-03-28 DIAGNOSIS — I739 Peripheral vascular disease, unspecified: Secondary | ICD-10-CM

## 2024-03-28 DIAGNOSIS — E66811 Obesity, class 1: Secondary | ICD-10-CM

## 2024-03-28 DIAGNOSIS — E7800 Pure hypercholesterolemia, unspecified: Secondary | ICD-10-CM

## 2024-03-28 DIAGNOSIS — I1310 Hypertensive heart and chronic kidney disease without heart failure, with stage 1 through stage 4 chronic kidney disease, or unspecified chronic kidney disease: Secondary | ICD-10-CM | POA: Diagnosis not present

## 2024-03-28 DIAGNOSIS — E1122 Type 2 diabetes mellitus with diabetic chronic kidney disease: Secondary | ICD-10-CM

## 2024-03-28 DIAGNOSIS — Z6830 Body mass index (BMI) 30.0-30.9, adult: Secondary | ICD-10-CM

## 2024-03-28 DIAGNOSIS — Z794 Long term (current) use of insulin: Secondary | ICD-10-CM

## 2024-03-28 DIAGNOSIS — E1151 Type 2 diabetes mellitus with diabetic peripheral angiopathy without gangrene: Secondary | ICD-10-CM

## 2024-03-28 DIAGNOSIS — E6609 Other obesity due to excess calories: Secondary | ICD-10-CM

## 2024-03-28 MED ORDER — EZETIMIBE 10 MG PO TABS: 10.0000 mg | ORAL_TABLET | Freq: Every day | ORAL | 1 refills | Status: DC

## 2024-03-28 MED ORDER — DEXCOM G7 SENSOR MISC: 10 refills | Status: DC

## 2024-03-28 MED ORDER — ATORVASTATIN CALCIUM 80 MG PO TABS: 80.0000 mg | ORAL_TABLET | Freq: Every day | ORAL | 2 refills | Status: AC

## 2024-03-28 MED ORDER — GABAPENTIN 300 MG PO CAPS: ORAL_CAPSULE | ORAL | 2 refills | Status: DC

## 2024-03-28 NOTE — Progress Notes (Signed)
 I,Victoria T Basil Lim, CMA,acting as a Neurosurgeon for Smiley Dung, MD.,have documented all relevant documentation on the behalf of Smiley Dung, MD,as directed by  Smiley Dung, MD while in the presence of Smiley Dung, MD.  Subjective:  Patient ID: Meagan [name] , female    DOB: October 31, 1961 , 63 y.o.   MRN: [id number]  Chief Complaint  Patient presents with   Diabetes    Patient presents today for diabetes, bp & cholesterol follow up. She reports compliance with medications. Denies headache, chest pain & sob.   Hypertension   Hyperlipidemia    HPI Discussed the use of AI scribe software for clinical note transcription with the patient, who gave verbal consent to proceed.  History of Present Illness Meagan [name] is a 63 year old female who presents for a blood pressure check.  She recently saw an endocrinologist who adjusted her diabetes medications. Tresiba  was discontinued, and she is now managing her blood sugar with NovoLog  on a sliding scale and Mounjaro . She had experienced nocturnal hypoglycemia with blood sugar readings as low as 54 mg/dL, which led to the discontinuation of nighttime insulin .  She is currently taking Jardiance  25 mg daily and ezetimibe , which was added to her regimen. There is confusion regarding her atorvastatin  use; she believed she was told to stop it, but it remains on her medication list. She is also on losartan  for blood pressure management.  She takes gabapentin  300 mg, one tablet three times a day and two at night, although she sometimes adjusts the dose based on her needs. She also uses Tylenol  and a cream for pain management.  She mentions difficulty obtaining a continuous glucose monitor due to issues with prior authorization from [facility]. She engages in physical activity, including walking and dancing.      Diabetes She presents for her follow-up diabetic visit. She has type 2 diabetes mellitus. Her disease course has been improving.  Pertinent negatives for hypoglycemia include no hunger. Pertinent negatives for diabetes include no blurred vision and no visual change. There are no hypoglycemic complications. Symptoms are improving. Diabetic complications include nephropathy. Risk factors for coronary artery disease include hypertension, obesity and diabetes mellitus. Current diabetic treatment includes insulin  injections and oral agent (monotherapy). She is compliant with treatment most of the time. She is following a generally healthy diet. She has not had a previous visit with a dietitian. She participates in exercise intermittently. Her breakfast blood glucose is taken between 9-10 am. Her breakfast blood glucose range is generally 110-130 mg/dl. She does not see a podiatrist.Eye exam is current.  Hypertension This is a chronic problem. The current episode started more than 1 year ago. Pertinent negatives include no blurred vision. Risk factors for coronary artery disease include diabetes mellitus, dyslipidemia, obesity, post-menopausal state and sedentary lifestyle. Past treatments include ACE inhibitors. Compliance problems include exercise.      Past Medical History:  Diagnosis Date   Arthritis    Chronic kidney disease    Complication of anesthesia    Diabetes mellitus (HCC)    GERD (gastroesophageal reflux disease)    Heart murmur    when 18 per patient   Hyperlipidemia    Hypertension    Neuropathy    Pneumonia    PONV (postoperative nausea and vomiting)    PVD (peripheral vascular disease) (HCC)      Family History  Problem Relation Age of Onset   Kidney failure Mother    Liver disease Mother  Early death Father    Hypertension Brother    Colon cancer Neg Hx    Stomach cancer Neg Hx    Colon polyps Neg Hx    Esophageal cancer Neg Hx    Ulcerative colitis Neg Hx      Current Outpatient Medications:    Albuterol -Budesonide (AIRSUPRA ) 90-80 MCG/ACT AERO, Inhale 2 puffs into the lungs 4 (four) times  daily as needed., Disp: 10.7 g, Rfl: 3   Blood Glucose Monitoring Suppl (ONETOUCH VERIO FLEX SYSTEM) w/Device KIT, Use as directed to check blood sugars 2 times per day dx: e11.65, Disp: 1 kit, Rfl: 1   Cholecalciferol  (DIALYVITE VITAMIN D 5000 PO), Take 10,000 Units by mouth at bedtime., Disp: , Rfl:    [facility] PAIN RELIEF 500 MG tablet, Take 1,000 mg by mouth 3 (three) times daily., Disp: , Rfl:    diclofenac  Sodium (VOLTAREN ) 1 % GEL, Apply 1 Application topically 3 (three) times daily as needed (joint pain). Use as directed, apply to affected joints tid prn, Disp: 100 g, Rfl: 1   empagliflozin  (JARDIANCE ) 25 MG TABS tablet, Take 1 tablet (25 mg total) by mouth daily before breakfast., Disp: 90 tablet, Rfl: 3   ferrous sulfate  325 (65 FE) MG tablet, TAKE 1 TABLET BY MOUTH EVERY DAY WITH BREAKFAST (Patient taking differently: Take 325 mg by mouth at bedtime.), Disp: 90 tablet, Rfl: 1   glucose blood (ONETOUCH VERIO) test strip, USE TO CHECK BLOOD SUGAR TWICE A DAY, Disp: 100 strip, Rfl: 12   insulin  aspart (NOVOLOG  FLEXPEN) 100 UNIT/ML FlexPen, Inject 10-15 Units into the skin 3 (three) times daily with meals., Disp: 30 mL, Rfl: 6   Insulin  Pen Needle 32G X 4 MM MISC, 1 Device by Does not apply route in the morning, at noon, in the evening, and at bedtime., Disp: 400 each, Rfl: 3   losartan  (COZAAR ) 25 MG tablet, Take 1 tablet (25 mg total) by mouth daily., Disp: 90 tablet, Rfl: 3   MAGNESIUM  PO, Take 1 tablet by mouth daily., Disp: , Rfl:    Multiple Vitamin (MULTIVITAMIN WITH MINERALS) TABS tablet, Take 1 tablet by mouth daily., Disp: , Rfl:    nystatin-triamcinolone  (MYCOLOG II) cream, APPLY TO THE AFFECTED AREAS 2 TIMES PER DAY IN THE MORNING AND EVENING FOR 2 WEEKS AS NEEDED, Disp: , Rfl:    Omega-3 Fatty Acids (FISH OIL PO), Take 1 capsule by mouth in the morning., Disp: , Rfl:    omeprazole  (PRILOSEC ) 40 MG capsule, TAKE 1 CAPSULE (40 MG TOTAL) BY MOUTH DAILY., Disp: 90 capsule, Rfl: 2    sertraline  (ZOLOFT ) 50 MG tablet, TAKE 1 TABLET BY MOUTH EVERYDAY AT BEDTIME, Disp: 90 tablet, Rfl: 1   tirzepatide  (MOUNJARO ) 7.5 MG/0.5ML Pen, Inject 7.5 mg into the skin once a week., Disp: 6 mL, Rfl: 3   triamcinolone  cream (KENALOG ) 0.1 %, Apply 1 Application topically 2 (two) times daily., Disp: 30 g, Rfl: 0   valACYclovir  (VALTREX ) 500 MG tablet, TAKE 1 TABLET (500 MG TOTAL) BY MOUTH IN THE MORNING, Disp: 90 tablet, Rfl: 1   atorvastatin  (LIPITOR ) 80 MG tablet, Take 1 tablet (80 mg total) by mouth daily., Disp: 90 tablet, Rfl: 2   Continuous Glucose Sensor (DEXCOM G7 SENSOR) MISC, USE FOR CONTINUOUS BLOOD GLUCOSE MONITORING, REPLACE EVERY 10 DAYS., Disp: 3 each, Rfl: 10   ezetimibe  (ZETIA ) 10 MG tablet, Take 1 tablet (10 mg total) by mouth daily., Disp: 90 tablet, Rfl: 1   gabapentin  (NEURONTIN ) 300 MG capsule,  TAKE 1 CAPSULE BY MOUTH AT BREAKFAST AND LUNCH, AND. TAKE 2 CAPSULES TO EQUAL 600MG  AT NIGHT., Disp: 120 capsule, Rfl: 2   insulin  degludec (TRESIBA  FLEXTOUCH) 200 UNIT/ML FlexTouch Pen, Inject 30 Units into the skin every evening. (Patient not taking: Reported on 03/28/2024), Disp: , Rfl:    mupirocin  cream (BACTROBAN ) 2 %, Apply 1 Application topically 2 (two) times daily. (Patient not taking: Reported on 03/28/2024), Disp: 15 g, Rfl: 0   Turmeric 500 MG CAPS, Take 500 mg by mouth at bedtime. (Patient not taking: Reported on 03/28/2024), Disp: , Rfl:    Allergies  Allergen Reactions   Promethazine  Hcl Shortness Of Breath and Other (See Comments)    Bells palsy   Lisinopril  Itching and Swelling     Review of Systems  Constitutional: Negative.   Eyes:  Negative for blurred vision.  Respiratory: Negative.    Cardiovascular: Negative.   Gastrointestinal: Negative.   Neurological: Negative.   Psychiatric/Behavioral: Negative.       Today's Vitals   03/28/24 1608  BP: 110/70  Pulse: 78  Temp: 98.1 F (36.7 C)  SpO2: 98%  Weight: 174 lb 3.4 oz (79 kg)  Height: 5\' 3"  (1.6 m)    Body mass index is 30.86 kg/m.  Wt Readings from Last 3 Encounters:  03/28/24 174 lb 3.4 oz (79 kg)  03/04/24 172 lb (78 kg)  11/04/23 190 lb 9.6 oz (86.5 kg)     Objective:  Physical Exam Vitals and nursing note reviewed.  Constitutional:      Appearance: Normal appearance.  HENT:     Head: Normocephalic and atraumatic.  Eyes:     Extraocular Movements: Extraocular movements intact.  Cardiovascular:     Rate and Rhythm: Normal rate and regular rhythm.     Heart sounds: Normal heart sounds.  Pulmonary:     Effort: Pulmonary effort is normal.     Breath sounds: Normal breath sounds.  Musculoskeletal:     Cervical back: Normal range of motion.  Skin:    General: Skin is warm.  Neurological:     General: No focal deficit present.     Mental Status: She is alert.  Psychiatric:        Mood and Affect: Mood normal.        Behavior: Behavior normal.         Assessment And Plan:  Type 2 diabetes mellitus with stage 2 chronic kidney disease, with long-term current use of insulin  [facility]) Assessment & Plan: Chronic, I will check labs as below. She was congratulated on her lifestyle changes. Type 2 diabetes managed with Mounjaro  and NovoLog . Discontinued nighttime insulin  due to nocturnal hypoglycemia. Issues obtaining continuous glucose monitor due to prior authorization. - Continue Mounjaro  and NovoLog  as prescribed. - Ensure prior authorization for continuous glucose monitor. - Most recent Endo notes reviewed in detail.   Orders: -     CBC -     CMP14+EGFR -     Dexcom G7 Sensor; USE FOR CONTINUOUS BLOOD GLUCOSE MONITORING, REPLACE EVERY 10 DAYS.  Dispense: 3 each; Refill: 10  Diabetes mellitus type 2 with peripheral artery disease (HCC) Assessment & Plan: Chronic, encouraged to aim for at least 150 minutes of exercise per week. She is a non-smoker.    Hypertensive heart and renal disease with renal failure, stage 1 through stage 4 or unspecified chronic kidney  disease, without heart failure Assessment & Plan: Chronic, well controlled. She is reminded to follow a low sodium diet. She  will continue with losartan  daily.   Orders: -     CBC -     CMP14+EGFR  Pure hypercholesterolemia Assessment & Plan: Chronic, LDL goal is less than 70.  Hyperlipidemia with elevated LDL. Atorvastatin  mistakenly stopped, increasing cardiovascular risk. Requires atorvastatin  and ezetimibe  for cholesterol management. - Resume atorvastatin  as previously prescribed. - Continue ezetimibe  as prescribed. - Reinforced importance of medication adherence for cholesterol management.   Orders: -     CBC -     CMP14+EGFR -     Atorvastatin  Calcium ; Take 1 tablet (80 mg total) by mouth daily.  Dispense: 90 tablet; Refill: 2  Class 1 obesity due to excess calories with serious comorbidity and body mass index (BMI) of 30.0 to 30.9 in adult Assessment & Plan: She is encouraged to strive for BMI less than 28 to decrease cardiac risk. Advised to aim for at least 150 minutes of exercise per week.    Other orders -     Ezetimibe ; Take 1 tablet (10 mg total) by mouth daily.  Dispense: 90 tablet; Refill: 1 -     Gabapentin ; TAKE 1 CAPSULE BY MOUTH AT BREAKFAST AND LUNCH, AND. TAKE 2 CAPSULES TO EQUAL 600MG  AT NIGHT.  Dispense: 120 capsule; Refill: 2    Return if symptoms worsen or fail to improve.  Patient was given opportunity to ask questions. Patient verbalized understanding of the plan and was able to repeat key elements of the plan. All questions were answered to their satisfaction.    I, Smiley Dung, MD, have reviewed all documentation for this visit. The documentation on 03/28/24 for the exam, diagnosis, procedures, and orders are all accurate and complete.   IF YOU HAVE BEEN REFERRED TO A SPECIALIST, IT MAY TAKE 1-2 WEEKS TO SCHEDULE/PROCESS THE REFERRAL. IF YOU HAVE NOT HEARD FROM US /SPECIALIST IN TWO WEEKS, PLEASE GIVE US  A CALL AT [phone].   THE  PATIENT IS ENCOURAGED TO PRACTICE SOCIAL DISTANCING DUE TO THE COVID-19 PANDEMIC.

## 2024-03-28 NOTE — Patient Instructions (Signed)

## 2024-03-29 LAB — CBC
Hematocrit: 41.4 % (ref 34.0–46.6)
Hemoglobin: 13.1 g/dL (ref 11.1–15.9)
MCH: 27.7 pg (ref 26.6–33.0)
MCHC: 31.6 g/dL (ref 31.5–35.7)
MCV: 88 fL (ref 79–97)
Platelets: 348 10*3/uL (ref 150–450)
RBC: 4.73 x10E6/uL (ref 3.77–5.28)
RDW: 17.7 % — ABNORMAL HIGH (ref 11.7–15.4)
WBC: 6.5 10*3/uL (ref 3.4–10.8)

## 2024-03-29 LAB — CMP14+EGFR
ALT: 32 IU/L (ref 0–32)
AST: 15 IU/L (ref 0–40)
Albumin: 4 g/dL (ref 3.9–4.9)
Alkaline Phosphatase: 108 IU/L (ref 44–121)
BUN/Creatinine Ratio: 13 (ref 12–28)
BUN: 15 mg/dL (ref 8–27)
Bilirubin Total: 0.2 mg/dL (ref 0.0–1.2)
CO2: 19 mmol/L — ABNORMAL LOW (ref 20–29)
Calcium: 9 mg/dL (ref 8.7–10.3)
Chloride: 111 mmol/L — ABNORMAL HIGH (ref 96–106)
Creatinine, Ser: 1.12 mg/dL — ABNORMAL HIGH (ref 0.57–1.00)
Globulin, Total: 2.7 g/dL (ref 1.5–4.5)
Glucose: 96 mg/dL (ref 70–99)
Potassium: 4.6 mmol/L (ref 3.5–5.2)
Sodium: 143 mmol/L (ref 134–144)
Total Protein: 6.7 g/dL (ref 6.0–8.5)
eGFR: 56 mL/min/{1.73_m2} — ABNORMAL LOW (ref >59)

## 2024-04-02 ENCOUNTER — Encounter: Payer: Self-pay | Admitting: Internal Medicine

## 2024-04-02 ENCOUNTER — Other Ambulatory Visit: Payer: Self-pay | Admitting: Internal Medicine

## 2024-04-03 ENCOUNTER — Encounter: Payer: Self-pay | Admitting: Internal Medicine

## 2024-04-03 DIAGNOSIS — E1151 Type 2 diabetes mellitus with diabetic peripheral angiopathy without gangrene: Secondary | ICD-10-CM | POA: Insufficient documentation

## 2024-04-03 NOTE — Assessment & Plan Note (Signed)
 Chronic, encouraged to aim for at least 150 minutes of exercise per week. She is a non-smoker.

## 2024-04-03 NOTE — Assessment & Plan Note (Signed)
 She is encouraged to strive for BMI less than 28 to decrease cardiac risk. Advised to aim for at least 150 minutes of exercise per week.

## 2024-04-03 NOTE — Assessment & Plan Note (Signed)
 Chronic, LDL goal is less than 70.  Hyperlipidemia with elevated LDL. Atorvastatin  mistakenly stopped, increasing cardiovascular risk. Requires atorvastatin  and ezetimibe  for cholesterol management. - Resume atorvastatin  as previously prescribed. - Continue ezetimibe  as prescribed. - Reinforced importance of medication adherence for cholesterol management.

## 2024-04-03 NOTE — Assessment & Plan Note (Signed)
 Chronic, I will check labs as below. She was congratulated on her lifestyle changes. Type 2 diabetes managed with Mounjaro  and NovoLog . Discontinued nighttime insulin  due to nocturnal hypoglycemia. Issues obtaining continuous glucose monitor due to prior authorization. - Continue Mounjaro  and NovoLog  as prescribed. - Ensure prior authorization for continuous glucose monitor. - Most recent Endo notes reviewed in detail.

## 2024-04-03 NOTE — Assessment & Plan Note (Signed)
 Chronic, well controlled. She is reminded to follow a low sodium diet. She will continue with losartan  daily.

## 2024-05-10 ENCOUNTER — Ambulatory Visit: Payer: BC Managed Care – PPO | Admitting: Internal Medicine

## 2024-05-10 ENCOUNTER — Encounter: Payer: Self-pay | Admitting: Internal Medicine

## 2024-05-10 VITALS — BP 126/72 | HR 87 | Temp 98.6°F | Ht 63.0 in | Wt 167.4 lb

## 2024-05-10 DIAGNOSIS — I739 Peripheral vascular disease, unspecified: Secondary | ICD-10-CM

## 2024-05-10 DIAGNOSIS — Z794 Long term (current) use of insulin: Secondary | ICD-10-CM

## 2024-05-10 DIAGNOSIS — Z0000 Encounter for general adult medical examination without abnormal findings: Secondary | ICD-10-CM

## 2024-05-10 DIAGNOSIS — I1310 Hypertensive heart and chronic kidney disease without heart failure, with stage 1 through stage 4 chronic kidney disease, or unspecified chronic kidney disease: Secondary | ICD-10-CM | POA: Diagnosis not present

## 2024-05-10 DIAGNOSIS — E1122 Type 2 diabetes mellitus with diabetic chronic kidney disease: Secondary | ICD-10-CM | POA: Diagnosis not present

## 2024-05-10 DIAGNOSIS — N1831 Chronic kidney disease, stage 3a: Secondary | ICD-10-CM

## 2024-05-10 DIAGNOSIS — K130 Diseases of lips: Secondary | ICD-10-CM

## 2024-05-10 DIAGNOSIS — E7800 Pure hypercholesterolemia, unspecified: Secondary | ICD-10-CM

## 2024-05-10 LAB — POCT URINALYSIS DIP (CLINITEK)
Bilirubin, UA: NEGATIVE
Blood, UA: NEGATIVE
Glucose, UA: 500 mg/dL — AB
Ketones, POC UA: NEGATIVE mg/dL
Leukocytes, UA: NEGATIVE
Nitrite, UA: NEGATIVE
POC PROTEIN,UA: NEGATIVE
Spec Grav, UA: 1.015 (ref 1.010–1.025)
Urobilinogen, UA: 0.2 U/dL
pH, UA: 5.5 (ref 5.0–8.0)

## 2024-05-10 NOTE — Assessment & Plan Note (Addendum)
 Chronic, diabetic foot exam was performed. She has had recent episodes of hyperglycemia.  She was congratulated on her lifestyle changes. Type 2 diabetes managed with Mounjaro  and NovoLog . She is no longer on basal insulin  per Endo.  Blood glucose levels are fluctuating with recent hyperglycemia and hypoglycemia. Current medication Mounjaro  7.5 mg may need adjustment due to worsening control. Decreased physical activity may contribute to elevated glucose levels. - Encourage consistent physical activity to manage blood glucose levels. - Consult endocrinologist regarding potential Mounjaro  dosage adjustment if glucose levels do not improve. - Monitor blood glucose closely and maintain a consistent diet to prevent fluctuations. - Ensure adequate nutritional intake, including sufficient protein, to prevent deficiencies and muscle atrophy.

## 2024-05-10 NOTE — Assessment & Plan Note (Signed)

## 2024-05-10 NOTE — Progress Notes (Signed)
 I,Jameka J Llittleton, CMA,acting as a Neurosurgeon for Catheryn LOISE Slocumb, MD.,have documented all relevant documentation on the behalf of Catheryn LOISE Slocumb, MD,as directed by  Catheryn LOISE Slocumb, MD while in the presence of Catheryn LOISE Slocumb, MD.  Subjective:    Patient ID: Meagan [name] , female    DOB: [birth year] , 63 y.o.   MRN: [id number]  Chief Complaint  Patient presents with   Annual Exam    Patient presents today for a annual exam. Patient doesn't have any specific questions or concerns. Denies headaches, chest pain, sob.The patient is followed by Dr. Jon Rummer for pelvic exams.     HPI Discussed the use of AI scribe software for clinical note transcription with the patient, who gave verbal consent to proceed.  History of Present Illness Meagan [name] is a 63 year old female with diabetes who presents for a physical and diabetes check.  She experiences fluctuations in her blood sugar levels, with recent highs of 350 mg/dL and lows of 60 mg/dL. The cause is unclear, as it does not seem related to her diet. Blood sugar levels have been increasing, with some days reaching 228 mg/dL and others 629 mg/dL, although on other days they have been under 150 mg/dL. Morning blood sugars are around 150 mg/dL.  She is currently on Mounjaro  7.5 mg since October and has not increased the dose due to previously stable blood sugars. Her medication regimen also includes sertraline  50 mg, omeprazole  40 mg, losartan  25 mg, gabapentin  300 mg (one at breakfast and lunch, two at night), Zetia  10 mg, and Jardiance  25 mg. She is not currently on Tresiba . She is also followed by endo.   She has lost seven pounds and reports a decreased craving for sweets. She is unsure what contributed to her sugars going up, mentioning a drink she had one night but not recalling what she ate that day. She admits to possibly moving less, which could be contributing to her increased blood sugars. 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  She is followed by vascular for circulation issues in her upper arms and legs, noting decreased circulation in her legs. She attributes her decreased activity to working less.  Diabetes She presents for her follow-up diabetic visit. She has type 2 diabetes mellitus. Her disease course has been stable. There are no hypoglycemic associated symptoms. There are no diabetic associated symptoms. Pertinent negatives for diabetes include no blurred vision and no visual change. There are no hypoglycemic complications. Symptoms are stable. There are no diabetic complications. Risk factors for coronary artery disease include hypertension, obesity and diabetes mellitus. Current diabetic treatment includes oral agent (monotherapy). She is compliant with treatment all of the time. She has not had a previous visit with a dietitian. She participates in exercise intermittently. Her breakfast blood glucose is taken between 9-10 am. Her breakfast blood glucose range is generally 110-130 mg/dl. (Blood sugar averaging 130-140 was running in 90's) She does not see a podiatrist.Eye exam is current.  Hypertension This is a chronic problem. The current episode started more than 1 year ago. Pertinent negatives include no blurred vision. Risk factors for coronary artery disease include diabetes mellitus, dyslipidemia, obesity, post-menopausal state and sedentary lifestyle. Past treatments include ACE inhibitors. Compliance problems include exercise.      Past Medical History:  Diagnosis Date   Arthritis    Chronic kidney disease    Complication of  anesthesia    Diabetes mellitus (HCC)    GERD (gastroesophageal reflux disease)    Heart murmur    when 18 per patient   Hyperlipidemia    Hypertension    Neuropathy    Pneumonia    PONV (postoperative nausea and vomiting)    PVD (peripheral vascular disease) (HCC)       Family History  Problem Relation Age of Onset   Kidney failure Mother    Liver disease Mother    Early death Father    Hypertension Brother    Colon cancer Neg Hx    Stomach cancer Neg Hx    Colon polyps Neg Hx    Esophageal cancer Neg Hx    Ulcerative colitis Neg Hx      Current Outpatient Medications:    Albuterol -Budesonide (AIRSUPRA ) 90-80 MCG/ACT AERO, Inhale 2 puffs into the lungs 4 (four) times daily as needed., Disp: 10.7 g, Rfl: 3   atorvastatin  (LIPITOR ) 80 MG tablet, Take 1 tablet (80 mg total) by mouth daily., Disp: 90 tablet, Rfl: 2   Blood Glucose Monitoring Suppl (ONETOUCH VERIO FLEX SYSTEM) w/Device KIT, Use as directed to check blood sugars 2 times per day dx: e11.65, Disp: 1 kit, Rfl: 1   Cholecalciferol  (DIALYVITE VITAMIN D 5000 PO), Take 10,000 Units by mouth at bedtime., Disp: , Rfl:    Continuous Glucose Sensor (DEXCOM G7 SENSOR) MISC, USE FOR CONTINUOUS BLOOD GLUCOSE MONITORING, REPLACE EVERY 10 DAYS., Disp: 3 each, Rfl: 10   [employer] PAIN RELIEF 500 MG tablet, Take 1,000 mg by mouth 3 (three) times daily., Disp: , Rfl:    diclofenac  Sodium (VOLTAREN ) 1 % GEL, Apply 1 Application topically 3 (three) times daily as needed (joint pain). Use as directed, apply to affected joints tid prn, Disp: 100 g, Rfl: 1   empagliflozin  (JARDIANCE ) 25 MG TABS tablet, Take 1 tablet (25 mg total) by mouth daily before breakfast., Disp: 90 tablet, Rfl: 3   ezetimibe  (ZETIA ) 10 MG tablet, Take 1 tablet (10 mg total) by mouth daily., Disp: 90 tablet, Rfl: 1   ferrous sulfate  325 (65 FE) MG tablet, TAKE 1 TABLET BY MOUTH EVERY DAY WITH BREAKFAST (Patient taking differently: Take 325 mg by mouth at bedtime.), Disp: 90 tablet, Rfl: 1   gabapentin  (NEURONTIN ) 300 MG capsule, TAKE 1 CAPSULE BY MOUTH AT BREAKFAST AND LUNCH, AND. TAKE 2 CAPSULES TO EQUAL 600MG  AT NIGHT., Disp: 120 capsule, Rfl: 2   glucose blood (ONETOUCH VERIO) test strip, USE TO CHECK BLOOD SUGAR TWICE A DAY, Disp: 100 strip,  Rfl: 12   insulin  aspart (NOVOLOG  FLEXPEN) 100 UNIT/ML FlexPen, Inject 10-15 Units into the skin 3 (three) times daily with meals., Disp: 30 mL, Rfl: 6   Insulin  Pen Needle 32G X 4 MM MISC, 1 Device by Does not apply route in the morning, at noon, in the evening, and at bedtime., Disp: 400 each, Rfl: 3   losartan  (COZAAR ) 25 MG tablet, Take 1 tablet (25 mg total) by mouth daily., Disp: 90 tablet, Rfl: 3   MAGNESIUM  PO, Take 1 tablet by mouth daily., Disp: , Rfl:    Multiple Vitamin (MULTIVITAMIN WITH MINERALS) TABS tablet, Take 1 tablet by mouth daily., Disp: , Rfl:    nystatin-triamcinolone  (MYCOLOG II) cream, APPLY TO THE AFFECTED AREAS 2 TIMES PER DAY IN THE MORNING AND EVENING FOR 2 WEEKS AS NEEDED, Disp: , Rfl:    Omega-3 Fatty Acids (FISH OIL PO), Take 1 capsule by mouth in the morning., Disp: ,  Rfl:    omeprazole  (PRILOSEC ) 40 MG capsule, TAKE 1 CAPSULE (40 MG TOTAL) BY MOUTH DAILY., Disp: 90 capsule, Rfl: 2   sertraline  (ZOLOFT ) 50 MG tablet, TAKE 1 TABLET BY MOUTH EVERYDAY AT BEDTIME, Disp: 90 tablet, Rfl: 1   tirzepatide  (MOUNJARO ) 7.5 MG/0.5ML Pen, Inject 7.5 mg into the skin once a week., Disp: 6 mL, Rfl: 3   triamcinolone  cream (KENALOG ) 0.1 %, Apply 1 Application topically 2 (two) times daily., Disp: 30 g, Rfl: 0   insulin  degludec (TRESIBA  FLEXTOUCH) 200 UNIT/ML FlexTouch Pen, Inject 30 Units into the skin every evening. (Patient not taking: Reported on 05/10/2024), Disp: , Rfl:    Turmeric 500 MG CAPS, Take 500 mg by mouth at bedtime. (Patient not taking: Reported on 03/28/2024), Disp: , Rfl:    valACYclovir  (VALTREX ) 500 MG tablet, TAKE 1 TABLET (500 MG TOTAL) BY MOUTH IN THE MORNING, Disp: 90 tablet, Rfl: 1   Allergies  Allergen Reactions   Promethazine  Hcl Shortness Of Breath and Other (See Comments)    Bells palsy   Lisinopril  Itching and Swelling      The patient states she uses none for birth control. No LMP recorded. Patient is postmenopausal.. Negative for Dysmenorrhea.  Negative for: breast discharge, breast lump(s), breast pain and breast self exam. Associated symptoms include abnormal vaginal bleeding. Pertinent negatives include abnormal bleeding (hematology), anxiety, decreased libido, depression, difficulty falling sleep, dyspareunia, history of infertility, nocturia, sexual dysfunction, sleep disturbances, urinary incontinence, urinary urgency, vaginal discharge and vaginal itching. Diet regular.The patient states her exercise level is  intermittent.  . The patient's tobacco use is:  Social History   Tobacco Use  Smoking Status Never  Smokeless Tobacco Never  . She has been exposed to passive smoke. The patient's alcohol use is:  Social History   Substance and Sexual Activity  Alcohol Use Yes   Comment: occasional    Review of Systems  Constitutional: Negative.   HENT: Negative.    Eyes: Negative.  Negative for blurred vision.  Respiratory: Negative.    Cardiovascular: Negative.   Gastrointestinal: Negative.   Endocrine: Negative.   Genitourinary: Negative.   Musculoskeletal: Negative.   Skin: Negative.   Neurological: Negative.   Hematological: Negative.   Psychiatric/Behavioral: Negative.       Today's Vitals   05/10/24 1109  BP: 126/72  Pulse: 87  Temp: 98.6 F (37 C)  SpO2: 98%  Weight: 167 lb 6.4 oz (75.9 kg)  Height: 5' 3 (1.6 m)   Body mass index is 29.65 kg/m.  Wt Readings from Last 3 Encounters:  05/10/24 167 lb 6.4 oz (75.9 kg)  03/28/24 174 lb 3.4 oz (79 kg)  03/04/24 172 lb (78 kg)     Objective:  Physical Exam Vitals and nursing note reviewed.  Constitutional:      Appearance: Normal appearance.  HENT:     Head: Normocephalic and atraumatic.     Right Ear: Tympanic membrane, ear canal and external ear normal. There is no impacted cerumen.     Left Ear: Tympanic membrane, ear canal and external ear normal. There is no impacted cerumen.     Mouth/Throat:     Pharynx: No oropharyngeal exudate or posterior  oropharyngeal erythema.     Comments: Fissures located at both corners of her mouth No erythema  Eyes:     Extraocular Movements: Extraocular movements intact.     Conjunctiva/sclera: Conjunctivae normal.     Pupils: Pupils are equal, round, and reactive to light.  Cardiovascular:     Rate and Rhythm: Normal rate and regular rhythm.     Heart sounds: Normal heart sounds.  Pulmonary:     Effort: Pulmonary effort is normal.     Breath sounds: Normal breath sounds.  Abdominal:     Palpations: Abdomen is soft.     Tenderness: There is no abdominal tenderness. There is no rebound.  Genitourinary:    Comments: Deferred   Musculoskeletal:        General: Normal range of motion.     Cervical back: Normal range of motion.  Feet:     Right foot:     Protective Sensation: 5 sites tested.  5 sites sensed.     Skin integrity: Dry skin present.     Toenail Condition: Right toenails are long.     Left foot:     Protective Sensation: 5 sites tested.  5 sites sensed.     Toenail Condition: Left toenails are long.     Comments: Decreased DP pulses  Skin:    General: Skin is warm.   Neurological:     General: No focal deficit present.     Mental Status: She is alert.   Psychiatric:        Mood and Affect: Mood normal.        Behavior: Behavior normal.      Assessment And Plan:     Routine general medical examination at health care facility Assessment & Plan: A full exam was performed.  Importance of monthly self breast exams was discussed with the patient.  She is advised to get 30-45 minutes of regular exercise, no less than four to five days per week. Both weight-bearing and aerobic exercises are recommended.  She is advised to follow a healthy diet with at least six fruits/veggies per day, decrease intake of red meat and other saturated fats and to increase fish intake to twice weekly.  Meats/fish should not be fried -- baked, boiled or broiled is preferable. It is also important  to cut back on your sugar intake.  Be sure to read labels - try to avoid anything with added sugar, high fructose corn syrup or other sweeteners.  If you must use a sweetener, you can try stevia or monkfruit.  It is also important to avoid artificially sweetened foods/beverages and diet drinks. Lastly, wear SPF 50 sunscreen on exposed skin and when in direct sunlight for an extended period of time.  Be sure to avoid fast food restaurants and aim for at least 60 ounces of water daily.      Orders: -     Lipid panel  Type 2 diabetes mellitus with stage 3a chronic kidney disease, without long-term current use of insulin  (HCC) Assessment & Plan: Chronic, diabetic foot exam was performed. She has had recent episodes of hyperglycemia.  She was congratulated on her lifestyle changes. Type 2 diabetes managed with Mounjaro  and NovoLog . She is no longer on basal insulin  per Endo.  Blood glucose levels are fluctuating with recent hyperglycemia and hypoglycemia. Current medication Mounjaro  7.5 mg may need adjustment due to worsening control. Decreased physical activity may contribute to elevated glucose levels. - Encourage consistent physical activity to manage blood glucose levels. - Consult endocrinologist regarding potential Mounjaro  dosage adjustment if glucose levels do not improve. - Monitor blood glucose closely and maintain a consistent diet to prevent fluctuations. - Ensure adequate nutritional intake, including sufficient protein, to prevent deficiencies and muscle atrophy.    Orders: -  Microalbumin / creatinine urine ratio -     Lipid panel -     EKG 12-Lead -     TSH -     PTH, intact and calcium  -     Phosphorus -     Protein electrophoresis, serum -     BMP8+eGFR -     US  RENAL; Future -     POCT URINALYSIS DIP (CLINITEK)  Hypertensive heart and renal disease with renal failure, stage 1 through stage 4 or unspecified chronic kidney disease, without heart failure Assessment &  Plan: Chronic, well controlled. EKG performed, NSR w/o acute changes.  She is reminded to follow a low sodium diet. She will continue with losartan  daily.    PVD (peripheral vascular disease) (HCC) Assessment & Plan: Chronic, LDL goal is less than 70 (less than 55 would be ideal).  She will continue with atorvastatin  80mg  daily. Importance of regular exercise was discussed with the patient.    Pure hypercholesterolemia Assessment & Plan: Chronic, LDL goal is less than 70.  Hyperlipidemia with elevated LDL. Requires atorvastatin  and ezetimibe  for cholesterol management. - Reinforced importance of medication adherence for cholesterol management.   Orders: -     Lipid panel -     TSH  Cheilosis Assessment & Plan: Cheilosis may indicate a nutritional deficiency. Current multivitamin and meat consumption suggest dietary intake may not be the issue. - Ensure adequate nutritional intake, including sufficient protein, to prevent deficiencies. - Continue taking a multivitamin.    Return for 1 year physical, 6 month bp. Patient was given opportunity to ask questions. Patient verbalized understanding of the plan and was able to repeat key elements of the plan. All questions were answered to their satisfaction.   I, Catheryn LOISE Slocumb, MD, have reviewed all documentation for this visit. The documentation on 05/10/24 for the exam, diagnosis, procedures, and orders are all accurate and complete.

## 2024-05-11 ENCOUNTER — Ambulatory Visit: Payer: Self-pay | Admitting: Internal Medicine

## 2024-05-11 DIAGNOSIS — N1831 Chronic kidney disease, stage 3a: Secondary | ICD-10-CM

## 2024-05-11 DIAGNOSIS — E8729 Other acidosis: Secondary | ICD-10-CM

## 2024-05-12 LAB — BMP8+EGFR
BUN/Creatinine Ratio: 18 (ref 12–28)
BUN: 23 mg/dL (ref 8–27)
CO2: 16 mmol/L — ABNORMAL LOW (ref 20–29)
Calcium: 8.8 mg/dL (ref 8.7–10.3)
Chloride: 107 mmol/L — ABNORMAL HIGH (ref 96–106)
Creatinine, Ser: 1.29 mg/dL — ABNORMAL HIGH (ref 0.57–1.00)
Glucose: 167 mg/dL — ABNORMAL HIGH (ref 70–99)
Potassium: 4.6 mmol/L (ref 3.5–5.2)
Sodium: 140 mmol/L (ref 134–144)
eGFR: 47 mL/min/{1.73_m2} — ABNORMAL LOW (ref >59)

## 2024-05-12 LAB — PROTEIN ELECTROPHORESIS, SERUM
A/G Ratio: 1 (ref 0.7–1.7)
Albumin ELP: 3.2 g/dL (ref 2.9–4.4)
Alpha 1: 0.2 g/dL (ref 0.0–0.4)
Alpha 2: 0.7 g/dL (ref 0.4–1.0)
Beta: 1.2 g/dL (ref 0.7–1.3)
Gamma Globulin: 1.1 g/dL (ref 0.4–1.8)
Globulin, Total: 3.2 g/dL (ref 2.2–3.9)
Total Protein: 6.4 g/dL (ref 6.0–8.5)

## 2024-05-12 LAB — LIPID PANEL
Chol/HDL Ratio: 3.7 ratio (ref 0.0–4.4)
Cholesterol, Total: 147 mg/dL (ref 100–199)
HDL: 40 mg/dL (ref >39)
LDL Chol Calc (NIH): 89 mg/dL (ref 0–99)
Triglycerides: 99 mg/dL (ref 0–149)
VLDL Cholesterol Cal: 18 mg/dL (ref 5–40)

## 2024-05-12 LAB — MICROALBUMIN / CREATININE URINE RATIO
Creatinine, Urine: 112.7 mg/dL
Microalb/Creat Ratio: 52 mg/g{creat} — ABNORMAL HIGH (ref 0–29)
Microalbumin, Urine: 58.9 ug/mL

## 2024-05-12 LAB — PTH, INTACT AND CALCIUM: PTH: 35 pg/mL (ref 15–65)

## 2024-05-12 LAB — TSH: TSH: 1.51 u[IU]/mL (ref 0.450–4.500)

## 2024-05-12 LAB — PHOSPHORUS: Phosphorus: 4 mg/dL (ref 3.0–4.3)

## 2024-05-21 DIAGNOSIS — K130 Diseases of lips: Secondary | ICD-10-CM | POA: Insufficient documentation

## 2024-05-21 NOTE — Assessment & Plan Note (Signed)
 Chronic, LDL goal is less than 70.  Hyperlipidemia with elevated LDL. Requires atorvastatin  and ezetimibe  for cholesterol management. - Reinforced importance of medication adherence for cholesterol management.

## 2024-05-21 NOTE — Assessment & Plan Note (Signed)
 Cheilosis may indicate a nutritional deficiency. Current multivitamin and meat consumption suggest dietary intake may not be the issue. - Ensure adequate nutritional intake, including sufficient protein, to prevent deficiencies. - Continue taking a multivitamin.

## 2024-05-21 NOTE — Assessment & Plan Note (Signed)
 Chronic, well controlled. EKG performed, NSR w/o acute changes.  She is reminded to follow a low sodium diet. She will continue with losartan  daily.

## 2024-05-21 NOTE — Assessment & Plan Note (Signed)
 Chronic, LDL goal is less than 70 (less than 55 would be ideal).  She will continue with atorvastatin 80mg  daily. Importance of regular exercise was discussed with the patient.

## 2024-05-25 ENCOUNTER — Ambulatory Visit
Admission: RE | Admit: 2024-05-25 | Discharge: 2024-05-25 | Disposition: A | Discharge status: Home / Self Care | Source: Ambulatory Visit | Attending: Internal Medicine | Admitting: Internal Medicine

## 2024-05-25 DIAGNOSIS — N1831 Chronic kidney disease, stage 3a: Secondary | ICD-10-CM

## 2024-06-17 ENCOUNTER — Encounter: Payer: Self-pay | Admitting: Advanced Practice Midwife

## 2024-07-02 ENCOUNTER — Other Ambulatory Visit: Payer: Self-pay | Admitting: Internal Medicine

## 2024-07-04 ENCOUNTER — Other Ambulatory Visit: Payer: Self-pay | Admitting: Internal Medicine

## 2024-07-05 ENCOUNTER — Ambulatory Visit: Admitting: Internal Medicine

## 2024-07-07 NOTE — Progress Notes (Unsigned)
 Name: Meagan [name]  MRN/ DOB: [id number], 08/31/1961   Age/ Sex: 63 y.o., female    PCP: Jarold Medici, MD   Reason for Endocrinology Evaluation: Type 2 Diabetes Mellitus     Date of Initial Endocrinology Visit: 03/04/2023    PATIENT IDENTIFIER: Meagan [name] is a 63 y.o. female with a past medical history of HTN, PVD, DM. The patient presented for initial endocrinology clinic visit on 07/08/2024 for consultative assistance with her diabetes management.    HPI: Meagan [name] was    Diagnosed with DM 2005 Prior Medications tried/Intolerance: Metformin  -no intolerance , Janumet  - no intolerance  , victoza - not effective . Ozempic  - nausea                Hemoglobin A1c has ranged from 6.5% in 2019, peaking at 11.2% in 2023.  On her initial visit to our clinic she had an A1c 8.2% , she was already on Jardiance , Mounjaro , basal/prandial insulin     Prandial insulin  was put on hold with an A1c of 5.7% in April, 2025  SUBJECTIVE:   During the last visit (03/04/2024): A1c 5.7%     Today (07/08/24): Meagan [name] is here for a follow up on diabetes management. She has not had the dexcom in ~ 2-3 weeks   due to the need for authorization, she has been using glucose meter sporadically.   Pt has been noted with weight loss  No nausea or vomiting  Has notes an  episode of loose stools two days after taking mounjaro       HOME DIABETES REGIMEN: Jardiance  25 mg daily Mounjaro  7.5 mg weekly NovoLog  12 units with each meal  CF: NovoLog  (BG -130/30) TIDQAC  Losartan  25 mg daily      Statin: yes ACE-I/ARB: no   CONTINUOUS GLUCOSE MONITORING RECORD INTERPRETATION : N/A     DIABETIC COMPLICATIONS: Microvascular complications:  CKD, neuropathy Denies: retinopathy Last eye exam: Completed 2023  Macrovascular complications:  PVD Denies: CAD, CVA   PAST HISTORY: Past Medical History:  Past Medical History:  Diagnosis Date   Arthritis    Chronic kidney  disease    Complication of anesthesia    Diabetes mellitus (HCC)    GERD (gastroesophageal reflux disease)    Heart murmur    when 18 per patient   Hyperlipidemia    Hypertension    Neuropathy    Pneumonia    PONV (postoperative nausea and vomiting)    PVD (peripheral vascular disease) (HCC)    Past Surgical History:  Past Surgical History:  Procedure Laterality Date   AORTIC ARCH ANGIOGRAPHY N/A 08/01/2021   Procedure: AORTIC ARCH ANGIOGRAPHY;  Surgeon: Gretta Lonni PARAS, MD;  Location: [facility];  Service: Cardiovascular;  Laterality: N/A;   AORTIC ARCH ANGIOGRAPHY N/A 08/07/2022   Procedure: AORTIC ARCH ANGIOGRAPHY;  Surgeon: Gretta Lonni PARAS, MD;  Location: [facility];  Service: Cardiovascular;  Laterality: N/A;   CAROTID-SUBCLAVIAN BYPASS GRAFT Left 08/12/2021   Procedure: LEFT CAROTID-BRACHIAL ARTERY BYPASS using Left greater saphenous Vein, harvested from left leg.;  Surgeon: Gretta Lonni PARAS, MD;  Location: [facility] OR;  Service: Vascular;  Laterality: Left;   CAROTID-SUBCLAVIAN BYPASS GRAFT Left 08/13/2022   Procedure: REDO LEFT COMMON CAROTID-BRACHIAL ARTERY BYPASS USING PROPATEN GRAFT.;  Surgeon: Gretta Lonni PARAS, MD;  Location: [facility] OR;  Service: Vascular;  Laterality: Left;   CARPAL TUNNEL RELEASE Left 01/31/2021   CARPAL TUNNEL RELEASE Right 2021   Dr. Josefina   COLONOSCOPY WITH PROPOFOL   N/A 12/04/2022   Procedure: COLONOSCOPY WITH PROPOFOL ;  Surgeon: Aneita Gwendlyn DASEN, MD;  Location: [facility];  Service: Gastroenterology;  Laterality: N/A;   DILATION AND CURETTAGE OF UTERUS  2007   ENDARTERECTOMY Left 08/13/2022   Procedure: ENDARTERECTOMY CAROTID WITH BOVINE PATCH.;  Surgeon: Gretta Lonni PARAS, MD;  Location: [facility] OR;  Service: Vascular;  Laterality: Left;   INCISION AND DRAINAGE Left 09/17/2022   Procedure: INCISION AND DRAINAGE LEFT ARM;  Surgeon: Gretta Lonni PARAS, MD;  Location: [facility] OR;  Service: Vascular;  Laterality: Left;   POLYPECTOMY   12/04/2022   Procedure: POLYPECTOMY;  Surgeon: Aneita Gwendlyn DASEN, MD;  Location: [facility];  Service: Gastroenterology;;   ROTATOR CUFF REPAIR Right 2018   Dr. Josefina   TUBAL LIGATION  1992   UPPER EXTREMITY ANGIOGRAPHY Left 08/01/2021   Procedure: UPPER EXTREMITY ANGIOGRAPHY;  Surgeon: Gretta Lonni PARAS, MD;  Location: [facility];  Service: Cardiovascular;  Laterality: Left;   UPPER EXTREMITY ANGIOGRAPHY Left 08/07/2022   Procedure: Upper Extremity Angiography;  Surgeon: Gretta Lonni PARAS, MD;  Location: [facility] INVASIVE CV LAB;  Service: Cardiovascular;  Laterality: Left;    Social History:  reports that she has never smoked. She has never used smokeless tobacco. She reports current alcohol use. She reports that she does not use drugs. Family History:  Family History  Problem Relation Age of Onset   Kidney failure Mother    Liver disease Mother    Early death Father    Hypertension Brother    Colon cancer Neg Hx    Stomach cancer Neg Hx    Colon polyps Neg Hx    Esophageal cancer Neg Hx    Ulcerative colitis Neg Hx      HOME MEDICATIONS: Allergies as of 07/08/2024       Reactions   Promethazine  Hcl Shortness Of Breath, Other (See Comments)   Bells palsy   Lisinopril  Itching, Swelling        Medication List        Accurate as of July 08, 2024 12:21 PM. If you have any questions, ask your nurse or doctor.          Airsupra  90-80 MCG/ACT Aero Generic drug: Albuterol -Budesonide Inhale 2 puffs into the lungs 4 (four) times daily as needed.   atorvastatin  80 MG tablet Commonly known as: LIPITOR  Take 1 tablet (80 mg total) by mouth daily.   [facility] Pain Relief 500 MG tablet Generic drug: acetaminophen  Take 1,000 mg by mouth 3 (three) times daily.   Dexcom G7 Sensor Misc USE FOR CONTINUOUS BLOOD GLUCOSE MONITORING, REPLACE EVERY 10 DAYS.   DIALYVITE VITAMIN D 5000 PO Take 10,000 Units by mouth at bedtime.   diclofenac  Sodium 1 % Gel Commonly known as:  VOLTAREN  Apply 1 Application topically 3 (three) times daily as needed (joint pain). Use as directed, apply to affected joints tid prn   empagliflozin  25 MG Tabs tablet Commonly known as: Jardiance  Take 1 tablet (25 mg total) by mouth daily before breakfast.   ezetimibe  10 MG tablet Commonly known as: ZETIA  Take 1 tablet (10 mg total) by mouth daily.   ferrous sulfate  325 (65 FE) MG tablet TAKE 1 TABLET BY MOUTH EVERY DAY WITH BREAKFAST What changed: See the new instructions.   FISH OIL PO Take 1 capsule by mouth in the morning.   gabapentin  300 MG capsule Commonly known as: NEURONTIN  TAKE 1 CAPSULE BY MOUTH AT BREAKFAST AND LUNCH, AND. TAKE 2 CAPSULES TO EQUAL 600MG  AT  NIGHT.   Insulin  Pen Needle 32G X 4 MM Misc 1 Device by Does not apply route in the morning, at noon, in the evening, and at bedtime.   losartan  25 MG tablet Commonly known as: COZAAR  Take 1 tablet (25 mg total) by mouth daily.   MAGNESIUM  PO Take 1 tablet by mouth daily.   Mounjaro  7.5 MG/0.5ML Pen Generic drug: tirzepatide  Inject 7.5 mg into the skin once a week.   multivitamin with minerals Tabs tablet Take 1 tablet by mouth daily.   NovoLOG  FlexPen 100 UNIT/ML FlexPen Generic drug: insulin  aspart Inject 10-15 Units into the skin 3 (three) times daily with meals.   nystatin-triamcinolone  cream Commonly known as: MYCOLOG II APPLY TO THE AFFECTED AREAS 2 TIMES PER DAY IN THE MORNING AND EVENING FOR 2 WEEKS AS NEEDED   omeprazole  40 MG capsule Commonly known as: PRILOSEC  TAKE 1 CAPSULE (40 MG TOTAL) BY MOUTH DAILY.   OneTouch Verio Flex System w/Device Kit Use as directed to check blood sugars 2 times per day dx: e11.65   OneTouch Verio test strip Generic drug: glucose blood USE TO CHECK BLOOD SUGAR TWICE A DAY   sertraline  50 MG tablet Commonly known as: ZOLOFT  TAKE 1 TABLET BY MOUTH EVERYDAY AT BEDTIME   Tresiba  FlexTouch 200 UNIT/ML FlexTouch Pen Generic drug: insulin  degludec Inject  30 Units into the skin every evening.   triamcinolone  cream 0.1 % Commonly known as: KENALOG  Apply 1 Application topically 2 (two) times daily.   Turmeric 500 MG Caps Take 500 mg by mouth at bedtime.   valACYclovir  500 MG tablet Commonly known as: VALTREX  TAKE 1 TABLET (500 MG TOTAL) BY MOUTH IN THE MORNING         ALLERGIES: Allergies  Allergen Reactions   Promethazine  Hcl Shortness Of Breath and Other (See Comments)    Bells palsy   Lisinopril  Itching and Swelling     REVIEW OF SYSTEMS: A comprehensive ROS was conducted with the patient and is negative except as per HPI    OBJECTIVE:   VITAL SIGNS: Pulse 85   Ht 5' 3 (1.6 m)   Wt 166 lb (75.3 kg)   SpO2 98%   BMI 29.41 kg/m    Filed Weights   07/08/24 1214  Weight: 166 lb (75.3 kg)     PHYSICAL EXAM:  General: Pt appears well and is in NAD Vesicular rash around the neck area   Lungs: Clear with good BS bilat   Heart: RRR   Extremities:  Lower extremities - No pretibial edema.   Neuro: MS is good with appropriate affect, pt is alert and Ox3    DM foot exam: 03/04/2024  The skin of the feet is intact without sores or ulcerations. The pedal pulses are undetectable  The sensation is intact to a screening 5.07, 10 gram monofilament bilaterally   DATA REVIEWED:  Lab Results  Component Value Date   HGBA1C 5.7 (A) 03/04/2024   HGBA1C 6.9 (H) 11/04/2023   HGBA1C 6.8 (H) 08/27/2023    Latest Reference Range & Units 05/10/24 12:10  Sodium 134 - 144 mmol/L 140  Potassium 3.5 - 5.2 mmol/L 4.6  Chloride 96 - 106 mmol/L 107 (H)  CO2 20 - 29 mmol/L 16 (L)  Glucose 70 - 99 mg/dL 832 (H)  BUN 8 - 27 mg/dL 23  Creatinine 9.42 - 8.99 mg/dL 8.70 (H)  Calcium  8.7 - 10.3 mg/dL 8.8  BUN/Creatinine Ratio 12 - 28  18  eGFR >59 mL/min/1.73 47 (  L)  Phosphorus 3.0 - 4.3 mg/dL 4.0  Total Protein 6.0 - 8.5 g/dL 6.4  Total CHOL/HDL Ratio 0.0 - 4.4 ratio 3.7  Cholesterol, Total 100 - 199 mg/dL 852  HDL  Cholesterol >60 mg/dL 40  MICROALB/CREAT RATIO 0 - 29 mg/g creat 52 (H)  Triglycerides 0 - 149 mg/dL 99  VLDL Cholesterol Cal 5 - 40 mg/dL 18  LDL Chol Calc (NIH) 0 - 99 mg/dL 89    Old records , labs and images have been reviewed.    ASSESSMENT / PLAN / RECOMMENDATIONS:   1) Type 2 Diabetes Mellitus, Optimally controlled, With CKD III, and neuropathic complications - Most recent A1c of 7.2 %. Goal A1c < 7.0 %.     -A1c has increased from 5.7% to 7.2% - We discontinued basal insulin  as she was holding it due to hypoglycemia - I suspect the increase in the A1c is due to lack of frequent glucose checks and the use of correction scale when needed - A new prescription for Dexcom and a message to proceed with prior authorization has been sent to our prior authorization team - I will increase Mounjaro  as below - No other changes  MEDICATIONS: Continue Jardiance  25 mg daily Increase Mounjaro  10 mg weekly Continue NovoLog  10 units 3 times daily before every meal Continue CF: NovoLog  (BG-130/30) TIDQAC  EDUCATION / INSTRUCTIONS: BG monitoring instructions: Patient is instructed to check her blood sugars 3 times a day, before each meal. Call [facility] if: BG persistently < 70  I reviewed the Rule of 15 for the treatment of hypoglycemia in detail with the patient. Literature supplied.   2) Diabetic complications:  Eye: Does not have known diabetic retinopathy.  Neuro/ Feet: Does  have known diabetic peripheral neuropathy. Renal: Patient does  have known baseline CKD. She is not on an ACEI/ARB at present.    3) CKD III:  -She has intolerance to lisinopril  with pruritus and swelling -Tolerating losartan  - Encourage compliance, discussed with the patient the importance of ARB and protecting renal function  Medication  Continue losartan  25 mg daily    Follow-up in 4 months  Signed electronically by: Stefano Redgie Butts, MD  [facility] Endocrinology   [facility] [address] [place] [place], [place] [address] Phone: [phone] FAX: [phone]   CC: Jarold Medici, MD [address] [place] [address] Phone: [phone]  Fax: [phone]    Return to Endocrinology clinic as below: Future Appointments  Date Time Provider Department Center  09/20/2024  9:30 AM [facility]  09/20/2024 10:30 AM [facility]  09/20/2024 11:30 AM [facility]  09/20/2024 12:20 PM Gretta Lonni PARAS, MD [facility]  11/09/2024 11:40 AM Jarold Medici, MD [facility] None  05/15/2025 11:00 AM Jarold Medici, MD [facility] None

## 2024-07-08 ENCOUNTER — Other Ambulatory Visit (HOSPITAL_COMMUNITY): Payer: Self-pay

## 2024-07-08 ENCOUNTER — Telehealth: Payer: Self-pay

## 2024-07-08 ENCOUNTER — Ambulatory Visit: Admitting: Internal Medicine

## 2024-07-08 ENCOUNTER — Encounter: Payer: Self-pay | Admitting: Internal Medicine

## 2024-07-08 ENCOUNTER — Telehealth: Payer: Self-pay | Admitting: Internal Medicine

## 2024-07-08 VITALS — HR 85 | Ht 63.0 in | Wt 166.0 lb

## 2024-07-08 DIAGNOSIS — E1122 Type 2 diabetes mellitus with diabetic chronic kidney disease: Secondary | ICD-10-CM | POA: Diagnosis not present

## 2024-07-08 DIAGNOSIS — E1151 Type 2 diabetes mellitus with diabetic peripheral angiopathy without gangrene: Secondary | ICD-10-CM

## 2024-07-08 DIAGNOSIS — Z794 Long term (current) use of insulin: Secondary | ICD-10-CM

## 2024-07-08 DIAGNOSIS — N1832 Chronic kidney disease, stage 3b: Secondary | ICD-10-CM | POA: Diagnosis not present

## 2024-07-08 DIAGNOSIS — N182 Chronic kidney disease, stage 2 (mild): Secondary | ICD-10-CM

## 2024-07-08 DIAGNOSIS — E1142 Type 2 diabetes mellitus with diabetic polyneuropathy: Secondary | ICD-10-CM

## 2024-07-08 LAB — POCT GLYCOSYLATED HEMOGLOBIN (HGB A1C): Hemoglobin A1C: 7.2 % — AB (ref 4.0–5.6)

## 2024-07-08 LAB — POCT GLUCOSE (DEVICE FOR HOME USE): POC Glucose: 161 mg/dL — AB (ref 70–99)

## 2024-07-08 MED ORDER — DEXCOM G7 SENSOR MISC: 1.0000 | 3 refills | Status: AC

## 2024-07-08 MED ORDER — TIRZEPATIDE 10 MG/0.5ML ~~LOC~~ SOAJ: 10.0000 mg | SUBCUTANEOUS | 3 refills | Status: DC

## 2024-07-08 NOTE — Telephone Encounter (Signed)
 Pharmacy Patient Advocate Encounter   Received notification from Pt Calls Messages that prior authorization for Dexcom g7 sensor is required/requested.   Insurance verification completed.   The patient is insured through [employer] [facility] .   Per test claim: PA required; PA submitted to above mentioned insurance via CoverMyMeds Key/confirmation #/EOC [id number] Status is pending

## 2024-07-08 NOTE — Patient Instructions (Addendum)
 Continue Jardiance  25 mg, 1 tablet every morning  Increase  Mounjaro  10 mg once weekly  Continue NovoLog  12 units with each meal  Novolog  correctional insulin : ADD extra units on insulin  to your meal-time Novolog  dose if your blood sugars are higher than 160. Use the scale below to help guide you:   Blood sugar before meal Number of units to inject  Less than 160 0 unit  161 -  190 1 units  191 -  220 2 units  221 -  250 3 units  251 -  280 4 units  281 -  310 5 units      HOW TO TREAT LOW BLOOD SUGARS (Blood sugar LESS THAN 70 MG/DL) Please follow the RULE OF 15 for the treatment of hypoglycemia treatment (when your (blood sugars are less than 70 mg/dL)   STEP 1: Take 15 grams of carbohydrates when your blood sugar is low, which includes:  3-4 GLUCOSE TABS  OR 3-4 OZ OF JUICE OR REGULAR SODA OR ONE TUBE OF GLUCOSE GEL    STEP 2: RECHECK blood sugar in 15 MINUTES STEP 3: If your blood sugar is still low at the 15 minute recheck --> then, go back to STEP 1 and treat AGAIN with another 15 grams of carbohydrates.

## 2024-07-08 NOTE — Telephone Encounter (Signed)
 Can you please proceed with prior authorization on the Dexcom?    Thanks

## 2024-07-09 ENCOUNTER — Other Ambulatory Visit (HOSPITAL_COMMUNITY): Payer: Self-pay

## 2024-07-11 ENCOUNTER — Telehealth: Payer: Self-pay | Admitting: Internal Medicine

## 2024-07-11 ENCOUNTER — Other Ambulatory Visit: Payer: Self-pay

## 2024-07-11 MED ORDER — INSULIN PEN NEEDLE 32G X 4 MM MISC: 1.0000 | Freq: Four times a day (QID) | 3 refills | Status: DC

## 2024-07-11 NOTE — Telephone Encounter (Signed)
 Copied from CRM [id number]. Topic: Clinical - Prescription Issue >> Jul 11, 2024  4:42 PM Everette C wrote: Reason for CRM: The patient has called to share that their prescription for Insulin  Pen Needle 32G X 4 MM MISC [[id number]] remains incorrect. The patient shares that they have requested 8 mm rather than 4 mm needles. Please contact further when possible.

## 2024-07-12 ENCOUNTER — Other Ambulatory Visit: Payer: Self-pay

## 2024-07-15 ENCOUNTER — Other Ambulatory Visit: Payer: Self-pay | Admitting: Internal Medicine

## 2024-07-15 DIAGNOSIS — Z794 Long term (current) use of insulin: Secondary | ICD-10-CM

## 2024-07-15 NOTE — Telephone Encounter (Signed)
 Copied from CRM [id number]. Topic: Clinical - Medication Refill >> Jul 15, 2024  3:25 PM Wess RAMAN wrote: Medication: losartan  (COZAAR ) 25 MG tablet  Has the patient contacted their pharmacy? No (Agent: If no, request that the patient contact the pharmacy for the refill. If patient does not wish to contact the pharmacy document the reason why and proceed with request.) (Agent: If yes, when and what did the pharmacy advise?)  This is the patient's preferred pharmacy:  [id number] [place] [place] - [address] AT [facility] [address] [place] [address] Phone: [phone] Fax: [phone]  Is this the correct pharmacy for this prescription? Yes If no, delete pharmacy and type the correct one.   Has the prescription been filled recently? Yes  Is the patient out of the medication? Yes  Has the patient been seen for an appointment in the last year OR does the patient have an upcoming appointment? Yes  Can we respond through MyChart? Yes  Agent: Please be advised that Rx refills may take up to 3 business days. We ask that you follow-up with your pharmacy.

## 2024-07-17 ENCOUNTER — Other Ambulatory Visit: Payer: Self-pay | Admitting: Internal Medicine

## 2024-07-17 DIAGNOSIS — F419 Anxiety disorder, unspecified: Secondary | ICD-10-CM

## 2024-07-18 ENCOUNTER — Other Ambulatory Visit: Payer: Self-pay | Admitting: Internal Medicine

## 2024-07-18 ENCOUNTER — Other Ambulatory Visit: Payer: Self-pay

## 2024-07-18 DIAGNOSIS — E1122 Type 2 diabetes mellitus with diabetic chronic kidney disease: Secondary | ICD-10-CM

## 2024-07-18 MED ORDER — LOSARTAN POTASSIUM 25 MG PO TABS: 25.0000 mg | ORAL_TABLET | Freq: Every day | ORAL | 3 refills | Status: DC

## 2024-07-18 MED ORDER — INSULIN PEN NEEDLE 29G X 8MM MISC: 2 refills | Status: DC

## 2024-07-25 NOTE — Telephone Encounter (Signed)
 Pharmacy Patient Advocate Encounter  Received notification from [facility] that Prior Authorization for Dexcom G7 sensor has been APPROVED from 07/08/24 to 07/08/25   PA #/Case ID/Reference #: [id number]

## 2024-08-10 ENCOUNTER — Other Ambulatory Visit: Payer: Self-pay | Admitting: Internal Medicine

## 2024-08-18 ENCOUNTER — Other Ambulatory Visit: Payer: Self-pay

## 2024-08-18 DIAGNOSIS — I6523 Occlusion and stenosis of bilateral carotid arteries: Secondary | ICD-10-CM

## 2024-08-18 DIAGNOSIS — I998 Other disorder of circulatory system: Secondary | ICD-10-CM

## 2024-08-18 DIAGNOSIS — I739 Peripheral vascular disease, unspecified: Secondary | ICD-10-CM

## 2024-09-20 ENCOUNTER — Ambulatory Visit (HOSPITAL_BASED_OUTPATIENT_CLINIC_OR_DEPARTMENT_OTHER)
Admission: RE | Admit: 2024-09-20 | Discharge: 2024-09-20 | Disposition: A | Source: Ambulatory Visit | Attending: Vascular Surgery

## 2024-09-20 ENCOUNTER — Ambulatory Visit: Admitting: Vascular Surgery

## 2024-09-20 ENCOUNTER — Ambulatory Visit (HOSPITAL_COMMUNITY)
Admission: RE | Admit: 2024-09-20 | Discharge: 2024-09-20 | Disposition: A | Source: Ambulatory Visit | Attending: Vascular Surgery

## 2024-09-20 ENCOUNTER — Ambulatory Visit (HOSPITAL_COMMUNITY)
Admission: RE | Admit: 2024-09-20 | Discharge: 2024-09-20 | Disposition: A | Discharge status: Home / Self Care | Source: Ambulatory Visit | Attending: Vascular Surgery | Admitting: Vascular Surgery

## 2024-09-20 ENCOUNTER — Encounter: Payer: Self-pay | Admitting: Vascular Surgery

## 2024-09-20 VITALS — BP 104/63 | HR 72 | Temp 97.6°F | Resp 18 | Ht 63.0 in | Wt 157.9 lb

## 2024-09-20 DIAGNOSIS — I708 Atherosclerosis of other arteries: Secondary | ICD-10-CM | POA: Diagnosis not present

## 2024-09-20 DIAGNOSIS — I739 Peripheral vascular disease, unspecified: Secondary | ICD-10-CM | POA: Diagnosis present

## 2024-09-20 DIAGNOSIS — I998 Other disorder of circulatory system: Secondary | ICD-10-CM | POA: Diagnosis not present

## 2024-09-20 DIAGNOSIS — I6523 Occlusion and stenosis of bilateral carotid arteries: Secondary | ICD-10-CM | POA: Diagnosis present

## 2024-09-20 NOTE — Progress Notes (Signed)
 Patient name: Emiko Osorto MRN: [id number] DOB: Jan 26, 1961 Sex: female  REASON FOR VISIT: 1 year follow-up  HPI: Lateesha Bezold is a 63 y.o. female that presents for 1 year follow-up for ongoing surveillance. She most recently underwent left carotid endarterectomy with a redo left common carotid artery to brachial artery bypass with PTFE on 08/13/2022.  This was for an occluded left common carotid to brachial artery bypass with vein in the setting of recurrent tissue loss.    No complaints today.  States her left arm is doing great.  No other signs or symptoms of stroke or TIA.  Past Medical History:  Diagnosis Date   Arthritis    Carotid artery occlusion    Chronic kidney disease    Complication of anesthesia    Diabetes mellitus (HCC)    GERD (gastroesophageal reflux disease)    Heart murmur    when 18 per patient   Hyperlipidemia    Hypertension    Neuropathy    Pneumonia    PONV (postoperative nausea and vomiting)    PVD (peripheral vascular disease)     Past Surgical History:  Procedure Laterality Date   AORTIC ARCH ANGIOGRAPHY N/A 08/01/2021   Procedure: AORTIC ARCH ANGIOGRAPHY;  Surgeon: Gretta Lonni PARAS, MD;  Location: [facility];  Service: Cardiovascular;  Laterality: N/A;   AORTIC ARCH ANGIOGRAPHY N/A 08/07/2022   Procedure: AORTIC ARCH ANGIOGRAPHY;  Surgeon: Gretta Lonni PARAS, MD;  Location: [facility];  Service: Cardiovascular;  Laterality: N/A;   CAROTID-SUBCLAVIAN BYPASS GRAFT Left 08/12/2021   Procedure: LEFT CAROTID-BRACHIAL ARTERY BYPASS using Left greater saphenous Vein, harvested from left leg.;  Surgeon: Gretta Lonni PARAS, MD;  Location: [facility] OR;  Service: Vascular;  Laterality: Left;   CAROTID-SUBCLAVIAN BYPASS GRAFT Left 08/13/2022   Procedure: REDO LEFT COMMON CAROTID-BRACHIAL ARTERY BYPASS USING PROPATEN GRAFT.;  Surgeon: Gretta Lonni PARAS, MD;  Location: [facility] OR;  Service: Vascular;  Laterality: Left;   CARPAL TUNNEL RELEASE  Left 01/31/2021   CARPAL TUNNEL RELEASE Right 2021   Dr. Josefina   COLONOSCOPY WITH PROPOFOL  N/A 12/04/2022   Procedure: COLONOSCOPY WITH PROPOFOL ;  Surgeon: Aneita Gwendlyn DASEN, MD;  Location: [facility];  Service: Gastroenterology;  Laterality: N/A;   DILATION AND CURETTAGE OF UTERUS  2007   ENDARTERECTOMY Left 08/13/2022   Procedure: ENDARTERECTOMY CAROTID WITH BOVINE PATCH.;  Surgeon: Gretta Lonni PARAS, MD;  Location: [facility] OR;  Service: Vascular;  Laterality: Left;   INCISION AND DRAINAGE Left 09/17/2022   Procedure: INCISION AND DRAINAGE LEFT ARM;  Surgeon: Gretta Lonni PARAS, MD;  Location: [facility] OR;  Service: Vascular;  Laterality: Left;   POLYPECTOMY  12/04/2022   Procedure: POLYPECTOMY;  Surgeon: Aneita Gwendlyn DASEN, MD;  Location: [facility];  Service: Gastroenterology;;   ROTATOR CUFF REPAIR Right 2018   Dr. Josefina   TUBAL LIGATION  1992   UPPER EXTREMITY ANGIOGRAPHY Left 08/01/2021   Procedure: UPPER EXTREMITY ANGIOGRAPHY;  Surgeon: Gretta Lonni PARAS, MD;  Location: [facility];  Service: Cardiovascular;  Laterality: Left;   UPPER EXTREMITY ANGIOGRAPHY Left 08/07/2022   Procedure: Upper Extremity Angiography;  Surgeon: Gretta Lonni PARAS, MD;  Location: [facility];  Service: Cardiovascular;  Laterality: Left;    Family History  Problem Relation Age of Onset   Kidney failure Mother    Liver disease Mother    Early death Father    Hypertension Brother    Colon cancer Neg Hx    Stomach cancer Neg Hx  Colon polyps Neg Hx    Esophageal cancer Neg Hx    Ulcerative colitis Neg Hx     SOCIAL HISTORY: Social History   Tobacco Use   Smoking status: Never   Smokeless tobacco: Never  Substance Use Topics   Alcohol use: Yes    Comment: occasional    Allergies  Allergen Reactions   Promethazine  Hcl Shortness Of Breath and Other (See Comments)    Bells palsy   Lisinopril  Itching and Swelling    Current Outpatient Medications  Medication Sig Dispense Refill    Albuterol -Budesonide (AIRSUPRA ) 90-80 MCG/ACT AERO Inhale 2 puffs into the lungs 4 (four) times daily as needed. 10.7 g 3   atorvastatin  (LIPITOR ) 80 MG tablet Take 1 tablet (80 mg total) by mouth daily. 90 tablet 2   Blood Glucose Monitoring Suppl (ONETOUCH VERIO FLEX SYSTEM) w/Device KIT Use as directed to check blood sugars 2 times per day dx: e11.65 1 kit 1   Cholecalciferol  (DIALYVITE VITAMIN D 5000 PO) Take 10,000 Units by mouth at bedtime.     Continuous Glucose Sensor (DEXCOM G7 SENSOR) MISC 1 Device by Other route as directed. REPLACE EVERY 10 DAYS. 9 each 3   [employer] PAIN RELIEF 500 MG tablet Take 1,000 mg by mouth 3 (three) times daily.     diclofenac  Sodium (VOLTAREN ) 1 % GEL Apply 1 Application topically 3 (three) times daily as needed (joint pain). Use as directed, apply to affected joints tid prn 100 g 1   empagliflozin  (JARDIANCE ) 25 MG TABS tablet Take 1 tablet (25 mg total) by mouth daily before breakfast. 90 tablet 3   ezetimibe  (ZETIA ) 10 MG tablet Take 1 tablet (10 mg total) by mouth daily. 90 tablet 1   ferrous sulfate  325 (65 FE) MG tablet TAKE 1 TABLET BY MOUTH EVERY DAY WITH BREAKFAST (Patient taking differently: Take 325 mg by mouth at bedtime.) 90 tablet 1   gabapentin  (NEURONTIN ) 300 MG capsule TAKE 1 CAPSULE BY MOUTH 3 (THREE) TIMES DAILY. TAKE 2 CAPSULES TO EQUAL 600MG  AT NIGHT. 180 capsule 2   glucose blood (ONETOUCH VERIO) test strip USE TO CHECK BLOOD SUGAR TWICE A DAY 100 strip 12   insulin  aspart (NOVOLOG  FLEXPEN) 100 UNIT/ML FlexPen Inject 10-15 Units into the skin 3 (three) times daily with meals. 30 mL 6   losartan  (COZAAR ) 25 MG tablet Take 1 tablet (25 mg total) by mouth daily. 90 tablet 3   MAGNESIUM  PO Take 1 tablet by mouth daily.     Multiple Vitamin (MULTIVITAMIN WITH MINERALS) TABS tablet Take 1 tablet by mouth daily.     nystatin-triamcinolone  (MYCOLOG II) cream APPLY TO THE AFFECTED AREAS 2 TIMES PER DAY IN THE MORNING AND EVENING FOR 2 WEEKS AS NEEDED      Omega-3 Fatty Acids (FISH OIL PO) Take 1 capsule by mouth in the morning.     omeprazole  (PRILOSEC ) 40 MG capsule TAKE 1 CAPSULE (40 MG TOTAL) BY MOUTH DAILY. 90 capsule 2   sertraline  (ZOLOFT ) 50 MG tablet TAKE 1 TABLET BY MOUTH EVERYDAY AT BEDTIME 90 tablet 1   tirzepatide  (MOUNJARO ) 10 MG/0.5ML Pen Inject 10 mg into the skin once a week. 6 mL 3   triamcinolone  cream (KENALOG ) 0.1 % Apply 1 Application topically 2 (two) times daily. 30 g 0   valACYclovir  (VALTREX ) 500 MG tablet TAKE 1 TABLET (500 MG TOTAL) BY MOUTH IN THE MORNING 90 tablet 1   No current facility-administered medications for this visit.    REVIEW OF  SYSTEMS:  [X]  denotes positive finding, [ ]  denotes negative finding Cardiac  Comments:  Chest pain or chest pressure:    Shortness of breath upon exertion:    Short of breath when lying flat:    Irregular heart rhythm:        Vascular    Pain in calf, thigh, or hip brought on by ambulation:    Pain in feet at night that wakes you up from your sleep:     Blood clot in your veins:    Leg swelling:         Pulmonary    Oxygen at home:    Productive cough:     Wheezing:         Neurologic    Sudden weakness in arms or legs:     Sudden numbness in arms or legs:     Sudden onset of difficulty speaking or slurred speech:    Temporary loss of vision in one eye:     Problems with dizziness:         Gastrointestinal    Blood in stool:     Vomited blood:         Genitourinary    Burning when urinating:     Blood in urine:        Psychiatric    Major depression:         Hematologic    Bleeding problems:    Problems with blood clotting too easily:        Skin    Rashes or ulcers:        Constitutional    Fever or chills:      PHYSICAL EXAM: Vitals:   09/20/24 1046  BP: 104/63  Pulse: 72  Resp: 18  Temp: 97.6 F (36.4 C)  TempSrc: Temporal  SpO2: 98%  Weight: 157 lb 14.4 oz (71.6 kg)  Height: 5' 3 (1.6 m)    GENERAL: The patient is a  well-nourished female, in no acute distress. The vital signs are documented above. CARDIAC: There is a regular rate and rhythm.  VASCULAR:  2+ palpable left radial pulse Left neck incision healed ABDOMEN: Soft and non-tender. MUSCULOSKELETAL: There are no major deformities or cyanosis.   DATA:   Carotid duplex today shows right ICA velocities of 1 to 39% and left ICA velocities of 1 to 39%  Left upper extremity arterial duplex shows a patent carotid to brachial bypass with no visualized stenosis and good waveforms  ABI today monophasic  Assessment/Plan:  63 y.o. female that presents for 1 year follow-up for ongoing surveillance. She most recently underwent left carotid endarterectomy with a redo left common carotid artery to brachial artery bypass with PTFE on 08/13/2022.  This was for an occluded left common carotid to brachial artery bypass with vein in the setting of recurrent tissue loss.  Discussed that her duplex today shows her left carotid remains widely patent with a widely patent left common carotid to brachial artery bypass that was a redo bypass.  She has a palpable radial pulse at the wrist.  Also has no significant contralateral carotid disease.  Very thrilled with how her imaging studies look today.  I will follow-up with her in 1 year with carotid duplex and left upper extremity arterial duplex for ongoing surveillance.  I have asked that she stay on aspirin  and statin for risk reduction.    She has no lower extremity complaints today and states she can walk up to a half  to three quarters of a mile and I would not do anything from a PAD standpoint.   Lonni DOROTHA Gaskins, MD [facility] Office: [phone]

## 2024-10-13 ENCOUNTER — Other Ambulatory Visit: Payer: Self-pay | Admitting: Internal Medicine

## 2024-11-09 ENCOUNTER — Ambulatory Visit: Payer: Self-pay | Admitting: Internal Medicine

## 2024-11-09 ENCOUNTER — Encounter: Payer: Self-pay | Admitting: Internal Medicine

## 2024-11-09 ENCOUNTER — Ambulatory Visit: Admitting: Internal Medicine

## 2024-11-09 VITALS — BP 100/60 | Ht 63.0 in | Wt 160.0 lb

## 2024-11-09 VITALS — BP 110/70 | HR 87 | Temp 98.3°F | Ht 63.0 in | Wt 161.6 lb

## 2024-11-09 DIAGNOSIS — I739 Peripheral vascular disease, unspecified: Secondary | ICD-10-CM

## 2024-11-09 DIAGNOSIS — I1310 Hypertensive heart and chronic kidney disease without heart failure, with stage 1 through stage 4 chronic kidney disease, or unspecified chronic kidney disease: Secondary | ICD-10-CM

## 2024-11-09 DIAGNOSIS — E7800 Pure hypercholesterolemia, unspecified: Secondary | ICD-10-CM | POA: Diagnosis not present

## 2024-11-09 DIAGNOSIS — Z23 Encounter for immunization: Secondary | ICD-10-CM

## 2024-11-09 DIAGNOSIS — E1122 Type 2 diabetes mellitus with diabetic chronic kidney disease: Secondary | ICD-10-CM

## 2024-11-09 DIAGNOSIS — Z794 Long term (current) use of insulin: Secondary | ICD-10-CM

## 2024-11-09 DIAGNOSIS — N1832 Chronic kidney disease, stage 3b: Secondary | ICD-10-CM | POA: Diagnosis not present

## 2024-11-09 DIAGNOSIS — E1142 Type 2 diabetes mellitus with diabetic polyneuropathy: Secondary | ICD-10-CM | POA: Diagnosis not present

## 2024-11-09 DIAGNOSIS — E1151 Type 2 diabetes mellitus with diabetic peripheral angiopathy without gangrene: Secondary | ICD-10-CM | POA: Diagnosis not present

## 2024-11-09 DIAGNOSIS — F419 Anxiety disorder, unspecified: Secondary | ICD-10-CM

## 2024-11-09 LAB — POCT GLYCOSYLATED HEMOGLOBIN (HGB A1C): Hemoglobin A1C: 7.2 % — AB (ref 4.0–5.6)

## 2024-11-09 MED ORDER — INSULIN PEN NEEDLE 32G X 4 MM MISC: 1.0000 | Freq: Four times a day (QID) | 3 refills | Status: AC

## 2024-11-09 MED ORDER — TIRZEPATIDE 12.5 MG/0.5ML ~~LOC~~ SOAJ: 12.5000 mg | SUBCUTANEOUS | 3 refills | Status: AC

## 2024-11-09 MED ORDER — EZETIMIBE 10 MG PO TABS: 10.0000 mg | ORAL_TABLET | Freq: Every day | ORAL | 1 refills | Status: AC

## 2024-11-09 MED ORDER — LOSARTAN POTASSIUM 25 MG PO TABS: 25.0000 mg | ORAL_TABLET | Freq: Every day | ORAL | 3 refills | Status: AC

## 2024-11-09 MED ORDER — NOVOLOG FLEXPEN 100 UNIT/ML ~~LOC~~ SOPN: 12.0000 [IU] | PEN_INJECTOR | Freq: Three times a day (TID) | SUBCUTANEOUS | 6 refills | Status: AC

## 2024-11-09 NOTE — Patient Instructions (Signed)

## 2024-11-09 NOTE — Progress Notes (Addendum)
 cI,Meagan [name], CMA,acting as a neurosurgeon for Meagan LOISE Slocumb, MD.,have documented all relevant documentation on the behalf of Meagan LOISE Slocumb, MD,as directed by  Meagan LOISE Slocumb, MD while in the presence of Meagan LOISE Slocumb, MD.  Subjective:  Patient ID: Meagan [name] , female    DOB: 1961-07-15 , 63 y.o.   MRN: [id number]  Chief Complaint  Patient presents with   Hypertension    Patient presents today for bp & dm follow up. She reports compliance with medications. Denies headache, chest pain & sob. She has mammogram scheduled for 12/22.    Diabetes    HPI Discussed the use of AI scribe software for clinical note transcription with the patient, who gave verbal consent to proceed.  History of Present Illness Meagan [name] is a 63 year old female with diabetes and hypertension who presents for a diabetes and blood pressure check.  Her blood sugars were elevated when she was off Mounjaro  for a week, possibly due to financial constraints. Once she resumed Mounjaro , her blood sugars stabilized, usually around 150 mg/dL. Her blood sugar readings were 32% high, which she attributes to the week she missed her medication. She is currently taking Mounjaro  10 mg, Novolog  three times a day, and Jardiance  25 mg.  She is taking atorvastatin  80 mg and ezetimibe  for cholesterol management. She has been on ezetimibe  since being referred to another doctor.  She is also on losartan  25 mg for blood pressure management and omeprazole  40 mg. She continues to take sertraline  for mood management.  She is overdue for a mammogram, which is scheduled for December 22nd. Her last mammogram was a diagnostic one, and she has not had a screening mammogram since November 2023.  Regarding her eye health, she had her last eye exam in July 2024 with Dr. Rozena and typically goes for an eye exam once a year.   Diabetes She presents for her follow-up diabetic visit. She has type 2 diabetes mellitus. Her disease  course has been improving. Pertinent negatives for hypoglycemia include no hunger. Pertinent negatives for diabetes include no blurred vision and no visual change. There are no hypoglycemic complications. Symptoms are improving. Diabetic complications include nephropathy. Risk factors for coronary artery disease include hypertension, obesity and diabetes mellitus. Current diabetic treatment includes insulin  injections and oral agent (monotherapy). She is compliant with treatment most of the time. She is following a generally healthy diet. She has not had a previous visit with a dietitian. She participates in exercise intermittently. Her breakfast blood glucose is taken between 9-10 am. Her breakfast blood glucose range is generally 110-130 mg/dl. She does not see a podiatrist.Eye exam is current.  Hypertension This is a chronic problem. The current episode started more than 1 year ago. Pertinent negatives include no blurred vision. Risk factors for coronary artery disease include diabetes mellitus, dyslipidemia, obesity, post-menopausal state and sedentary lifestyle. Past treatments include ACE inhibitors. Compliance problems include exercise.      Past Medical History:  Diagnosis Date   Arthritis    Carotid artery occlusion    Chronic kidney disease    Complication of anesthesia    Diabetes mellitus (HCC)    GERD (gastroesophageal reflux disease)    Heart murmur    when 18 per patient   Hyperlipidemia    Hypertension    Neuropathy    Pneumonia    PONV (postoperative nausea and vomiting)    PVD (peripheral vascular disease)      Family History  Problem  Relation Age of Onset   Kidney failure Mother    Liver disease Mother    Early death Father    Hypertension Brother    Colon cancer Neg Hx    Stomach cancer Neg Hx    Colon polyps Neg Hx    Esophageal cancer Neg Hx    Ulcerative colitis Neg Hx      Current Outpatient Medications:    Albuterol -Budesonide (AIRSUPRA ) 90-80 MCG/ACT  AERO, Inhale 2 puffs into the lungs 4 (four) times daily as needed., Disp: 10.7 g, Rfl: 3   atorvastatin  (LIPITOR ) 80 MG tablet, Take 1 tablet (80 mg total) by mouth daily., Disp: 90 tablet, Rfl: 2   Blood Glucose Monitoring Suppl (ONETOUCH VERIO FLEX SYSTEM) w/Device KIT, Use as directed to check blood sugars 2 times per day dx: e11.65, Disp: 1 kit, Rfl: 1   Cholecalciferol  (DIALYVITE VITAMIN D 5000 PO), Take 10,000 Units by mouth at bedtime., Disp: , Rfl:    Continuous Glucose Sensor (DEXCOM G7 SENSOR) MISC, 1 Device by Other route as directed. REPLACE EVERY 10 DAYS., Disp: 9 each, Rfl: 3   [employer]S PAIN RELIEF 500 MG tablet, Take 1,000 mg by mouth 3 (three) times daily., Disp: , Rfl:    diclofenac  Sodium (VOLTAREN ) 1 % GEL, Apply 1 Application topically 3 (three) times daily as needed (joint pain). Use as directed, apply to affected joints tid prn, Disp: 100 g, Rfl: 1   empagliflozin  (JARDIANCE ) 25 MG TABS tablet, TAKE 1 TABLET BY MOUTH DAILY BEFORE BREAKFAST., Disp: 90 tablet, Rfl: 0   ferrous sulfate  325 (65 FE) MG tablet, TAKE 1 TABLET BY MOUTH EVERY DAY WITH BREAKFAST (Patient taking differently: Take 325 mg by mouth at bedtime.), Disp: 90 tablet, Rfl: 1   gabapentin  (NEURONTIN ) 300 MG capsule, TAKE 1 CAPSULE BY MOUTH 3 (THREE) TIMES DAILY. TAKE 2 CAPSULES TO EQUAL 600MG  AT NIGHT., Disp: 180 capsule, Rfl: 2   glucose blood (ONETOUCH VERIO) test strip, USE TO CHECK BLOOD SUGAR TWICE A DAY, Disp: 100 strip, Rfl: 12   MAGNESIUM  PO, Take 1 tablet by mouth daily., Disp: , Rfl:    Multiple Vitamin (MULTIVITAMIN WITH MINERALS) TABS tablet, Take 1 tablet by mouth daily., Disp: , Rfl:    nystatin-triamcinolone  (MYCOLOG II) cream, APPLY TO THE AFFECTED AREAS 2 TIMES PER DAY IN THE MORNING AND EVENING FOR 2 WEEKS AS NEEDED, Disp: , Rfl:    Omega-3 Fatty Acids (FISH OIL PO), Take 1 capsule by mouth in the morning., Disp: , Rfl:    omeprazole  (PRILOSEC ) 40 MG capsule, TAKE 1 CAPSULE (40 MG TOTAL) BY MOUTH  DAILY., Disp: 90 capsule, Rfl: 2   sertraline  (ZOLOFT ) 50 MG tablet, TAKE 1 TABLET BY MOUTH EVERYDAY AT BEDTIME, Disp: 90 tablet, Rfl: 1   triamcinolone  cream (KENALOG ) 0.1 %, Apply 1 Application topically 2 (two) times daily., Disp: 30 g, Rfl: 0   valACYclovir  (VALTREX ) 500 MG tablet, TAKE 1 TABLET (500 MG TOTAL) BY MOUTH IN THE MORNING, Disp: 90 tablet, Rfl: 1   ezetimibe  (ZETIA ) 10 MG tablet, Take 1 tablet (10 mg total) by mouth daily., Disp: 90 tablet, Rfl: 1   insulin  aspart (NOVOLOG  FLEXPEN) 100 UNIT/ML FlexPen, Inject 12-24 Units into the skin 3 (three) times daily with meals., Disp: 45 mL, Rfl: 6   Insulin  Pen Needle 32G X 4 MM MISC, 1 Device by Does not apply route in the morning, at noon, in the evening, and at bedtime., Disp: 400 each, Rfl: 3   losartan  (COZAAR )  25 MG tablet, Take 1 tablet (25 mg total) by mouth daily., Disp: 90 tablet, Rfl: 3   tirzepatide  (MOUNJARO ) 12.5 MG/0.5ML Pen, Inject 12.5 mg into the skin once a week., Disp: 6 mL, Rfl: 3   Allergies  Allergen Reactions   Promethazine  Hcl Shortness Of Breath and Other (See Comments)    Bells palsy   Lisinopril  Itching and Swelling     Review of Systems  Constitutional: Negative.   Eyes:  Negative for blurred vision.  Respiratory: Negative.    Cardiovascular: Negative.   Gastrointestinal: Negative.   Neurological: Negative.   Psychiatric/Behavioral: Negative.       Today's Vitals   11/09/24 1137  BP: 110/70  Pulse: 87  Temp: 98.3 F (36.8 C)  SpO2: 98%  Weight: 161 lb 9.6 oz (73.3 kg)  Height: 5' 3 (1.6 m)   Body mass index is 28.63 kg/m.  Wt Readings from Last 3 Encounters:  11/22/24 161 lb (73 kg)  11/09/24 160 lb (72.6 kg)  11/09/24 161 lb 9.6 oz (73.3 kg)     Objective:  Physical Exam Vitals and nursing note reviewed.  Constitutional:      Appearance: Normal appearance.  HENT:     Head: Normocephalic and atraumatic.  Eyes:     Extraocular Movements: Extraocular movements intact.   Cardiovascular:     Rate and Rhythm: Normal rate and regular rhythm.     Heart sounds: Normal heart sounds.  Pulmonary:     Effort: Pulmonary effort is normal.     Breath sounds: Normal breath sounds.  Musculoskeletal:     Cervical back: Normal range of motion.  Skin:    General: Skin is warm.  Neurological:     General: No focal deficit present.     Mental Status: She is alert.  Psychiatric:        Mood and Affect: Mood normal.        Behavior: Behavior normal.         Assessment And Plan:  Hypertensive heart and renal disease with renal failure, stage 1 through stage 4 or unspecified chronic kidney disease, without heart failure Assessment & Plan: Chronic, well controlled. Blood pressure management is crucial due to chronic kidney disease. - Continue losartan  25 mg daily. - She is reminded to follow a low sodium diet.  - She will continue with losartan  daily.   Orders: -     CMP14+EGFR -     Lipid panel  PVD (peripheral vascular disease) Assessment & Plan: Chronic, LDL goal is less than 70 (less than 55 would be ideal).  She will continue with atorvastatin  80mg  daily. Importance of regular exercise was discussed with the patient.  - Importance of regular exercise was again stressed to the patient.    Type 2 diabetes mellitus with stage 3b chronic kidney disease, with long-term current use of insulin  [facility]) Assessment & Plan: Chronic.  Blood sugars elevated due to a week off Mounjaro , now back to baseline of 150 mg/dL. Last kidney function test indicates stage 3 chronic kidney disease. - Ensure Mounjaro  is taken consistently. - Requested samples of Mounjaro  if unable to afford medication. - Advised on hydration, healthy fats, adequate protein intake, and strength training to prevent muscle wasting.  Orders: -     CMP14+EGFR -     Lipid panel -     Hemoglobin A1c  Pure hypercholesterolemia Assessment & Plan: Cholesterol management is ongoing with atorvastatin  and  ezetimibe . Goal is to keep cholesterol below 55 mg/dL. -  Continue atorvastatin  80 mg daily. - Continue ezetimibe  as prescribed.   Other orders -     Ezetimibe ; Take 1 tablet (10 mg total) by mouth daily.  Dispense: 90 tablet; Refill: 1  General health maintenance Overdue for a screening mammogram. Last eye exam was in July 2024. - Requested records of last eye exam from Dr. Wonda.  Return if symptoms worsen or fail to improve.  Patient was given opportunity to ask questions. Patient verbalized understanding of the plan and was able to repeat key elements of the plan. All questions were answered to their satisfaction.   I, Meagan LOISE Slocumb, MD, have reviewed all documentation for this visit. The documentation on 11/19/2024 for the exam, diagnosis, procedures, and orders are all accurate and complete.   IF YOU HAVE BEEN REFERRED TO A SPECIALIST, IT MAY TAKE 1-2 WEEKS TO SCHEDULE/PROCESS THE REFERRAL. IF YOU HAVE NOT HEARD FROM US /SPECIALIST IN TWO WEEKS, PLEASE GIVE US  A CALL AT [phone].   THE PATIENT IS ENCOURAGED TO PRACTICE SOCIAL DISTANCING DUE TO THE COVID-19 PANDEMIC.

## 2024-11-09 NOTE — Progress Notes (Signed)
 Name: Neyah Ellerman  MRN/ DOB: [id number], 1961-10-19   Age/ Sex: 63 y.o., female    PCP: Jarold Medici, MD   Reason for Endocrinology Evaluation: Type 2 Diabetes Mellitus     Date of Initial Endocrinology Visit: 03/04/2023    PATIENT IDENTIFIER: Ms. Elesia Pemberton is a 63 y.o. female with a past medical history of HTN, PVD, DM. The patient presented for initial endocrinology clinic visit on 11/09/2024 for consultative assistance with her diabetes management.    HPI: Ms. Lemire was    Diagnosed with DM 2005 Prior Medications tried/Intolerance: Metformin  -no intolerance , Janumet  - no intolerance  , victoza - not effective . Ozempic  - nausea                Hemoglobin A1c has ranged from 6.5% in 2019, peaking at 11.2% in 2023.  On her initial visit to our clinic she had an A1c 8.2% , she was already on Jardiance , Mounjaro , basal/prandial insulin     Prandial insulin  was put on hold with an A1c of 5.7% in April, 2025  SUBJECTIVE:   During the last visit (07/08/2024): A1c 7.2%     Today (11/09/24): Bobbi Cherney is here for a follow up on diabetes management. She has been checking multiple times a day to Dexcom.  She has had a rare hypoglycemic episode, she is symptomatic  Patient continues weight loss No nausea  Has diarrhea usually just the day after taking mounjaro   She is c/o worsening tingling and numbness of the feet, she is on Gabapentin  through her PCPs office   HOME DIABETES REGIMEN: Jardiance  25 mg daily Mounjaro  10 mg weekly NovoLog  12 units with each meal  CF: NovoLog  (BG -130/30) TIDQAC  Losartan  25 mg daily      Statin: yes ACE-I/ARB: no   CONTINUOUS GLUCOSE MONITORING RECORD INTERPRETATION    Dates of Recording: 11/25 - 11/07/2024  Sensor description: Dexcom  Results statistics:   CGM use % of time 91  Average and SD 178/54  Time in range     56   %  % Time Above 180 35  % Time above 250 8  % Time Below target 1   Glycemic  patterns summary: BG's are optimal overnight and fluctuate during the day  Hyperglycemic episodes postprandial  Hypoglycemic episodes occurred with correction bolus  Overnight periods: Optimal     DIABETIC COMPLICATIONS: Microvascular complications:  CKD, neuropathy Denies: retinopathy Last eye exam: Completed 2023  Macrovascular complications:  PVD Denies: CAD, CVA   PAST HISTORY: Past Medical History:  Past Medical History:  Diagnosis Date   Arthritis    Carotid artery occlusion    Chronic kidney disease    Complication of anesthesia    Diabetes mellitus (HCC)    GERD (gastroesophageal reflux disease)    Heart murmur    when 18 per patient   Hyperlipidemia    Hypertension    Neuropathy    Pneumonia    PONV (postoperative nausea and vomiting)    PVD (peripheral vascular disease)    Past Surgical History:  Past Surgical History:  Procedure Laterality Date   AORTIC ARCH ANGIOGRAPHY N/A 08/01/2021   Procedure: AORTIC ARCH ANGIOGRAPHY;  Surgeon: Gretta Lonni PARAS, MD;  Location: [facility];  Service: Cardiovascular;  Laterality: N/A;   AORTIC ARCH ANGIOGRAPHY N/A 08/07/2022   Procedure: AORTIC ARCH ANGIOGRAPHY;  Surgeon: Gretta Lonni PARAS, MD;  Location: [facility];  Service: Cardiovascular;  Laterality: N/A;   CAROTID-SUBCLAVIAN BYPASS GRAFT  Left 08/12/2021   Procedure: LEFT CAROTID-BRACHIAL ARTERY BYPASS using Left greater saphenous Vein, harvested from left leg.;  Surgeon: Gretta Lonni PARAS, MD;  Location: [facility] OR;  Service: Vascular;  Laterality: Left;   CAROTID-SUBCLAVIAN BYPASS GRAFT Left 08/13/2022   Procedure: REDO LEFT COMMON CAROTID-BRACHIAL ARTERY BYPASS USING PROPATEN GRAFT.;  Surgeon: Gretta Lonni PARAS, MD;  Location: [facility] OR;  Service: Vascular;  Laterality: Left;   CARPAL TUNNEL RELEASE Left 01/31/2021   CARPAL TUNNEL RELEASE Right 2021   Dr. Josefina   COLONOSCOPY WITH PROPOFOL  N/A 12/04/2022   Procedure: COLONOSCOPY WITH PROPOFOL ;   Surgeon: Aneita Gwendlyn DASEN, MD;  Location: [facility];  Service: Gastroenterology;  Laterality: N/A;   DILATION AND CURETTAGE OF UTERUS  2007   ENDARTERECTOMY Left 08/13/2022   Procedure: ENDARTERECTOMY CAROTID WITH BOVINE PATCH.;  Surgeon: Gretta Lonni PARAS, MD;  Location: [facility] OR;  Service: Vascular;  Laterality: Left;   INCISION AND DRAINAGE Left 09/17/2022   Procedure: INCISION AND DRAINAGE LEFT ARM;  Surgeon: Gretta Lonni PARAS, MD;  Location: [facility] OR;  Service: Vascular;  Laterality: Left;   POLYPECTOMY  12/04/2022   Procedure: POLYPECTOMY;  Surgeon: Aneita Gwendlyn DASEN, MD;  Location: [facility];  Service: Gastroenterology;;   ROTATOR CUFF REPAIR Right 2018   Dr. Josefina   TUBAL LIGATION  1992   UPPER EXTREMITY ANGIOGRAPHY Left 08/01/2021   Procedure: UPPER EXTREMITY ANGIOGRAPHY;  Surgeon: Gretta Lonni PARAS, MD;  Location: [facility];  Service: Cardiovascular;  Laterality: Left;   UPPER EXTREMITY ANGIOGRAPHY Left 08/07/2022   Procedure: Upper Extremity Angiography;  Surgeon: Gretta Lonni PARAS, MD;  Location: [facility] INVASIVE CV LAB;  Service: Cardiovascular;  Laterality: Left;    Social History:  reports that she has never smoked. She has never used smokeless tobacco. She reports current alcohol use. She reports that she does not use drugs. Family History:  Family History  Problem Relation Age of Onset   Kidney failure Mother    Liver disease Mother    Early death Father    Hypertension Brother    Colon cancer Neg Hx    Stomach cancer Neg Hx    Colon polyps Neg Hx    Esophageal cancer Neg Hx    Ulcerative colitis Neg Hx      HOME MEDICATIONS: Allergies as of 11/09/2024       Reactions   Promethazine  Hcl Shortness Of Breath, Other (See Comments)   Bells palsy   Lisinopril  Itching, Swelling        Medication List        Accurate as of November 09, 2024 12:24 PM. If you have any questions, ask your nurse or doctor.          Airsupra  90-80 MCG/ACT  Aero Generic drug: Albuterol -Budesonide Inhale 2 puffs into the lungs 4 (four) times daily as needed.   atorvastatin  80 MG tablet Commonly known as: LIPITOR  Take 1 tablet (80 mg total) by mouth daily.   [employer] Pain Relief 500 MG tablet Generic drug: acetaminophen  Take 1,000 mg by mouth 3 (three) times daily.   Dexcom G7 Sensor Misc 1 Device by Other route as directed. REPLACE EVERY 10 DAYS.   DIALYVITE VITAMIN D 5000 PO Take 10,000 Units by mouth at bedtime.   diclofenac  Sodium 1 % Gel Commonly known as: VOLTAREN  Apply 1 Application topically 3 (three) times daily as needed (joint pain). Use as directed, apply to affected joints tid prn   ezetimibe  10 MG tablet Commonly known as: ZETIA  Take 1  tablet (10 mg total) by mouth daily.   ferrous sulfate  325 (65 FE) MG tablet TAKE 1 TABLET BY MOUTH EVERY DAY WITH BREAKFAST What changed: See the new instructions.   FISH OIL PO Take 1 capsule by mouth in the morning.   gabapentin  300 MG capsule Commonly known as: NEURONTIN  TAKE 1 CAPSULE BY MOUTH 3 (THREE) TIMES DAILY. TAKE 2 CAPSULES TO EQUAL 600MG  AT NIGHT.   Jardiance  25 MG Tabs tablet Generic drug: empagliflozin  TAKE 1 TABLET BY MOUTH DAILY BEFORE BREAKFAST.   losartan  25 MG tablet Commonly known as: COZAAR  Take 1 tablet (25 mg total) by mouth daily.   MAGNESIUM  PO Take 1 tablet by mouth daily.   multivitamin with minerals Tabs tablet Take 1 tablet by mouth daily.   NovoLOG  FlexPen 100 UNIT/ML FlexPen Generic drug: insulin  aspart Inject 10-15 Units into the skin 3 (three) times daily with meals.   nystatin-triamcinolone  cream Commonly known as: MYCOLOG II APPLY TO THE AFFECTED AREAS 2 TIMES PER DAY IN THE MORNING AND EVENING FOR 2 WEEKS AS NEEDED   omeprazole  40 MG capsule Commonly known as: PRILOSEC  TAKE 1 CAPSULE (40 MG TOTAL) BY MOUTH DAILY.   OneTouch Verio Flex System w/Device Kit Use as directed to check blood sugars 2 times per day dx: e11.65   OneTouch  Verio test strip Generic drug: glucose blood USE TO CHECK BLOOD SUGAR TWICE A DAY   sertraline  50 MG tablet Commonly known as: ZOLOFT  TAKE 1 TABLET BY MOUTH EVERYDAY AT BEDTIME   tirzepatide  10 MG/0.5ML Pen Commonly known as: MOUNJARO  Inject 10 mg into the skin once a week.   triamcinolone  cream 0.1 % Commonly known as: KENALOG  Apply 1 Application topically 2 (two) times daily.   valACYclovir  500 MG tablet Commonly known as: VALTREX  TAKE 1 TABLET (500 MG TOTAL) BY MOUTH IN THE MORNING         ALLERGIES: Allergies  Allergen Reactions   Promethazine  Hcl Shortness Of Breath and Other (See Comments)    Bells palsy   Lisinopril  Itching and Swelling     REVIEW OF SYSTEMS: A comprehensive ROS was conducted with the patient and is negative except as per HPI    OBJECTIVE:   VITAL SIGNS: BP 100/60   Ht 5' 3 (1.6 m)   Wt 160 lb (72.6 kg)   BMI 28.34 kg/m    Filed Weights   11/09/24 1223  Weight: 160 lb (72.6 kg)      PHYSICAL EXAM:  General: Pt appears well and is in NAD Vesicular rash around the neck area   Lungs: Clear with good BS bilat   Heart: RRR   Extremities:  Lower extremities - No pretibial edema.   Neuro: MS is good with appropriate affect, pt is alert and Ox3    DM foot exam: 11/09/2024  The skin of the feet is intact without sores or ulcerations. The pedal pulses are undetectable  The sensation is intact to a screening 5.07, 10 gram monofilament bilaterally   DATA REVIEWED:  Lab Results  Component Value Date   HGBA1C 7.2 (A) 07/08/2024   HGBA1C 5.7 (A) 03/04/2024   HGBA1C 6.9 (H) 11/04/2023    Latest Reference Range & Units 05/10/24 12:10  Sodium 134 - 144 mmol/L 140  Potassium 3.5 - 5.2 mmol/L 4.6  Chloride 96 - 106 mmol/L 107 (H)  CO2 20 - 29 mmol/L 16 (L)  Glucose 70 - 99 mg/dL 832 (H)  BUN 8 - 27 mg/dL 23  Creatinine 9.42 -  1.00 mg/dL 8.70 (H)  Calcium  8.7 - 10.3 mg/dL 8.8  BUN/Creatinine Ratio 12 - 28  18  eGFR >59  mL/min/1.73 47 (L)  Phosphorus 3.0 - 4.3 mg/dL 4.0  Total Protein 6.0 - 8.5 g/dL 6.4  Total CHOL/HDL Ratio 0.0 - 4.4 ratio 3.7  Cholesterol, Total 100 - 199 mg/dL 852  HDL Cholesterol >60 mg/dL 40  MICROALB/CREAT RATIO 0 - 29 mg/g creat 52 (H)  Triglycerides 0 - 149 mg/dL 99  VLDL Cholesterol Cal 5 - 40 mg/dL 18  LDL Chol Calc (NIH) 0 - 99 mg/dL 89    Old records , labs and images have been reviewed.    ASSESSMENT / PLAN / RECOMMENDATIONS:   1) Type 2 Diabetes Mellitus, Optimally controlled, With CKD III, and neuropathic complications - Most recent A1c of 7.2 %. Goal A1c < 7.0 %.     -A1c remains stable - We discontinued basal insulin  as she was holding it due to hypoglycemia -In reviewing Dexcom download, patient has been no postprandial hyperglycemia, we have opted to increase Mounjaro  - I will also change her sensitivity factor from 30 to 35 to prevent hypoglycemia with correction bolus  MEDICATIONS: Continue Jardiance  25 mg daily Increase Mounjaro  10 mg weekly Continue NovoLog  12 units 3 times daily before every meal Change  CF: NovoLog  (BG-130/35) TIDQAC  EDUCATION / INSTRUCTIONS: BG monitoring instructions: Patient is instructed to check her blood sugars 3 times a day, before each meal. Call [facility] if: BG persistently < 70  I reviewed the Rule of 15 for the treatment of hypoglycemia in detail with the patient. Literature supplied.   2) Diabetic complications:  Eye: Does not have known diabetic retinopathy.  Neuro/ Feet: Does  have known diabetic peripheral neuropathy. Renal: Patient does  have known baseline CKD. She is not on an ACEI/ARB at present.    3) CKD III:  -She has intolerance to lisinopril  with pruritus and swelling -Tolerating losartan   Medication  Continue losartan  25 mg daily  4) Peripheral Neuropathy:  - Patient is on gabapentin  through PCPs office.  She did notice some worsening of her symptoms, we did discuss the  importance of optimizing glucose control, patient will discuss with PCP the possibility of increasing gabapentin    Follow-up in 6 months  Signed electronically by: Stefano Redgie Butts, MD  [facility]  [facility] [address] [place] [place], [place] [address] Phone: [phone] FAX: [phone]   CC: Jarold Medici, MD [address] [place] [address] [place] [address] Phone: [phone]  Fax: [phone]    Return to Endocrinology clinic as below: Future Appointments  Date Time Provider Department Center  05/15/2025 11:00 AM Jarold Medici, MD [facility] [phone] [place]

## 2024-11-09 NOTE — Patient Instructions (Addendum)
 Continue Jardiance  25 mg, 1 tablet every morning  Increase  Mounjaro  12.5 mg once weekly  Continue NovoLog  12 units with each meal  Novolog  correctional insulin : ADD extra units on insulin  to your meal-time Novolog  dose if your blood sugars are higher than 165. Use the scale below to help guide you:   Blood sugar before meal Number of units to inject  Less than 165 0 unit  166 - 200 1 units  201 - 235 2 units  236 - 270 3 units  271 - 305 4 units  306 - 340 5 units      HOW TO TREAT LOW BLOOD SUGARS (Blood sugar LESS THAN 70 MG/DL) Please follow the RULE OF 15 for the treatment of hypoglycemia treatment (when your (blood sugars are less than 70 mg/dL)   STEP 1: Take 15 grams of carbohydrates when your blood sugar is low, which includes:  3-4 GLUCOSE TABS  OR 3-4 OZ OF JUICE OR REGULAR SODA OR ONE TUBE OF GLUCOSE GEL    STEP 2: RECHECK blood sugar in 15 MINUTES STEP 3: If your blood sugar is still low at the 15 minute recheck --> then, go back to STEP 1 and treat AGAIN with another 15 grams of carbohydrates.

## 2024-11-19 NOTE — Assessment & Plan Note (Signed)
 Chronic.  Blood sugars elevated due to a week off Mounjaro , now back to baseline of 150 mg/dL. Last kidney function test indicates stage 3 chronic kidney disease. - Ensure Mounjaro  is taken consistently. - Requested samples of Mounjaro  if unable to afford medication. - Advised on hydration, healthy fats, adequate protein intake, and strength training to prevent muscle wasting.

## 2024-11-19 NOTE — Assessment & Plan Note (Signed)
 Chronic, LDL goal is less than 70 (less than 55 would be ideal).  She will continue with atorvastatin  80mg  daily. Importance of regular exercise was discussed with the patient.  - Importance of regular exercise was again stressed to the patient.

## 2024-11-19 NOTE — Assessment & Plan Note (Signed)
 Chronic, well controlled. Blood pressure management is crucial due to chronic kidney disease. - Continue losartan  25 mg daily. - She is reminded to follow a low sodium diet.  - She will continue with losartan  daily.

## 2024-11-19 NOTE — Assessment & Plan Note (Signed)
 Cholesterol management is ongoing with atorvastatin  and ezetimibe . Goal is to keep cholesterol below 55 mg/dL. - Continue atorvastatin  80 mg daily. - Continue ezetimibe  as prescribed.

## 2024-11-22 ENCOUNTER — Ambulatory Visit

## 2024-11-22 VITALS — BP 110/70 | HR 87 | Temp 98.3°F | Ht 63.0 in | Wt 161.0 lb

## 2024-11-22 DIAGNOSIS — Z23 Encounter for immunization: Secondary | ICD-10-CM

## 2024-11-22 NOTE — Progress Notes (Signed)
 Patient is in office today for a nurse visit for flu Immunization. Patient Injection was given in the  Left deltoid. Patient tolerated injection well.

## 2024-11-22 NOTE — Patient Instructions (Signed)

## 2024-11-23 LAB — HEMOGLOBIN A1C
Est. average glucose Bld gHb Est-mCnc: 160 mg/dL
Hgb A1c MFr Bld: 7.2 % — ABNORMAL HIGH (ref 4.8–5.6)

## 2024-11-23 LAB — CMP14+EGFR
ALT: 31 IU/L (ref 0–32)
AST: 18 IU/L (ref 0–40)
Albumin: 3.6 g/dL — ABNORMAL LOW (ref 3.9–4.9)
Alkaline Phosphatase: 98 IU/L (ref 49–135)
BUN/Creatinine Ratio: 15 (ref 12–28)
BUN: 19 mg/dL (ref 8–27)
Bilirubin Total: 0.3 mg/dL (ref 0.0–1.2)
CO2: 21 mmol/L (ref 20–29)
Calcium: 8.3 mg/dL — ABNORMAL LOW (ref 8.7–10.3)
Chloride: 108 mmol/L — ABNORMAL HIGH (ref 96–106)
Creatinine, Ser: 1.25 mg/dL — ABNORMAL HIGH (ref 0.57–1.00)
Globulin, Total: 2.3 g/dL (ref 1.5–4.5)
Glucose: 110 mg/dL — ABNORMAL HIGH (ref 70–99)
Potassium: 4.6 mmol/L (ref 3.5–5.2)
Sodium: 142 mmol/L (ref 134–144)
Total Protein: 5.9 g/dL — ABNORMAL LOW (ref 6.0–8.5)
eGFR: 48 mL/min/1.73 — ABNORMAL LOW

## 2024-11-23 LAB — LIPID PANEL
Chol/HDL Ratio: 3.8 ratio (ref 0.0–4.4)
Cholesterol, Total: 162 mg/dL (ref 100–199)
HDL: 43 mg/dL
LDL Chol Calc (NIH): 93 mg/dL (ref 0–99)
Triglycerides: 150 mg/dL — ABNORMAL HIGH (ref 0–149)
VLDL Cholesterol Cal: 26 mg/dL (ref 5–40)

## 2024-11-26 ENCOUNTER — Other Ambulatory Visit: Payer: Self-pay | Admitting: Internal Medicine

## 2024-11-29 ENCOUNTER — Ambulatory Visit: Payer: Self-pay | Admitting: Internal Medicine

## 2025-05-10 ENCOUNTER — Ambulatory Visit: Admitting: Internal Medicine

## 2025-05-15 ENCOUNTER — Encounter: Payer: Self-pay | Admitting: Internal Medicine
